# Patient Record
Sex: Male | Born: 1945 | Race: Black or African American | Hispanic: No | Marital: Married | Smoking: Never smoker
Health system: Southern US, Community
[De-identification: ages and names within clinical notes are randomized; demographics above are authoritative.]

## PROBLEM LIST (undated history)

## (undated) DIAGNOSIS — K449 Diaphragmatic hernia without obstruction or gangrene: Secondary | ICD-10-CM

## (undated) DIAGNOSIS — K5790 Diverticulosis of intestine, part unspecified, without perforation or abscess without bleeding: Secondary | ICD-10-CM

## (undated) DIAGNOSIS — N200 Calculus of kidney: Secondary | ICD-10-CM

## (undated) DIAGNOSIS — I700 Atherosclerosis of aorta: Secondary | ICD-10-CM

## (undated) DIAGNOSIS — M5136 Other intervertebral disc degeneration, lumbar region: Secondary | ICD-10-CM

## (undated) DIAGNOSIS — K635 Polyp of colon: Secondary | ICD-10-CM

## (undated) DIAGNOSIS — E119 Type 2 diabetes mellitus without complications: Secondary | ICD-10-CM

## (undated) DIAGNOSIS — M51369 Other intervertebral disc degeneration, lumbar region without mention of lumbar back pain or lower extremity pain: Secondary | ICD-10-CM

## (undated) DIAGNOSIS — D649 Anemia, unspecified: Secondary | ICD-10-CM

## (undated) DIAGNOSIS — K922 Gastrointestinal hemorrhage, unspecified: Secondary | ICD-10-CM

## (undated) DIAGNOSIS — K648 Other hemorrhoids: Secondary | ICD-10-CM

## (undated) DIAGNOSIS — K2270 Barrett's esophagus without dysplasia: Secondary | ICD-10-CM

## (undated) DIAGNOSIS — K5792 Diverticulitis of intestine, part unspecified, without perforation or abscess without bleeding: Secondary | ICD-10-CM

## (undated) DIAGNOSIS — I10 Essential (primary) hypertension: Secondary | ICD-10-CM

## (undated) HISTORY — DX: Diaphragmatic hernia without obstruction or gangrene: K44.9

## (undated) HISTORY — DX: Diverticulitis of intestine, part unspecified, without perforation or abscess without bleeding: K57.92

## (undated) HISTORY — DX: Polyp of colon: K63.5

## (undated) HISTORY — DX: Anemia, unspecified: D64.9

## (undated) HISTORY — DX: Calculus of kidney: N20.0

## (undated) HISTORY — DX: Atherosclerosis of aorta: I70.0

## (undated) HISTORY — DX: Other hemorrhoids: K64.8

## (undated) HISTORY — DX: Other intervertebral disc degeneration, lumbar region: M51.36

## (undated) HISTORY — PX: ACHILLES TENDON REPAIR: SUR1153

## (undated) HISTORY — PX: KNEE ARTHROSCOPY: SUR90

## (undated) HISTORY — DX: Barrett's esophagus without dysplasia: K22.70

## (undated) HISTORY — DX: Other intervertebral disc degeneration, lumbar region without mention of lumbar back pain or lower extremity pain: M51.369

## (undated) HISTORY — DX: Gastrointestinal hemorrhage, unspecified: K92.2

---

## 2002-07-01 ENCOUNTER — Emergency Department (HOSPITAL_COMMUNITY): Admission: EM | Admit: 2002-07-01 | Discharge: 2002-07-01 | Payer: Self-pay | Admitting: Emergency Medicine

## 2002-07-01 ENCOUNTER — Encounter: Payer: Self-pay | Admitting: Emergency Medicine

## 2018-01-12 ENCOUNTER — Telehealth (INDEPENDENT_AMBULATORY_CARE_PROVIDER_SITE_OTHER): Payer: Self-pay | Admitting: Physical Medicine and Rehabilitation

## 2018-01-12 NOTE — Telephone Encounter (Signed)
Patient came in stated he was told he could get an appointment with [facility]. Stated sciatic nerve issue.  Please call this patient and advise or schedule an appointment

## 2018-01-13 NOTE — Telephone Encounter (Signed)
Scheduled for 01/26/18 at 0900. I asked patient to bring copies of any x-rays or MRI, and he stated that he will try.

## 2018-01-26 ENCOUNTER — Ambulatory Visit (INDEPENDENT_AMBULATORY_CARE_PROVIDER_SITE_OTHER): Payer: Medicare Other | Admitting: Physical Medicine and Rehabilitation

## 2018-01-26 ENCOUNTER — Encounter (INDEPENDENT_AMBULATORY_CARE_PROVIDER_SITE_OTHER): Payer: Self-pay | Admitting: Physical Medicine and Rehabilitation

## 2018-01-26 VITALS — BP 137/85 | HR 64 | Temp 97.9°F | Ht 69.5 in | Wt 180.0 lb

## 2018-01-26 DIAGNOSIS — G8929 Other chronic pain: Secondary | ICD-10-CM

## 2018-01-26 DIAGNOSIS — M5442 Lumbago with sciatica, left side: Secondary | ICD-10-CM | POA: Diagnosis not present

## 2018-01-26 DIAGNOSIS — M47816 Spondylosis without myelopathy or radiculopathy, lumbar region: Secondary | ICD-10-CM | POA: Diagnosis not present

## 2018-01-26 DIAGNOSIS — M5416 Radiculopathy, lumbar region: Secondary | ICD-10-CM | POA: Diagnosis not present

## 2018-01-26 MED ORDER — PREDNISONE 50 MG PO TABS: ORAL_TABLET | ORAL | 0 refills | Status: DC

## 2018-01-26 MED ORDER — DICLOFENAC SODIUM 75 MG PO TBEC: 75.0000 mg | DELAYED_RELEASE_TABLET | Freq: Two times a day (BID) | ORAL | 0 refills | Status: DC

## 2018-01-26 NOTE — Progress Notes (Signed)
 .  Numeric Pain Rating Scale and Functional Assessment Average Pain 7 Pain Right Now 8 My pain is intermittent, dull and aching Pain is worse with: standing Pain improves with: rest   In the last MONTH (on 0-10 scale) has pain interfered with the following?  1. General activity like being  able to carry out your everyday physical activities such as walking, climbing stairs, carrying groceries, or moving a chair?  Rating(6)  2. Relation with others like being able to carry out your usual social activities and roles such as  activities at home, at work and in your community. Rating(3)  3. Enjoyment of life such that you have  been bothered by emotional problems such as feeling anxious, depressed or irritable?  Rating(0)

## 2018-01-27 ENCOUNTER — Encounter (INDEPENDENT_AMBULATORY_CARE_PROVIDER_SITE_OTHER): Payer: Self-pay | Admitting: Physical Medicine and Rehabilitation

## 2018-01-27 NOTE — Progress Notes (Signed)
Derrick Thornton - 72 y.o. male MRN [id number]  Date of birth: 1946-05-24  Office Visit Note: Visit Date: 01/26/2018 PCP: Darryl Lent, PA-C Referred by: No ref. provider found  Subjective: Chief Complaint  Patient presents with  . Lower Back - Pain  . Left Thigh - Pain   HPI: [name] is a young appearing 72 year old gentleman who have actually seen on several occasions because I see his mother.  This is the first time we have seen him as a patient.  He does come in as a self-referral.  He reports 4 weeks of severe at times low back and left buttock pain that radiates into the thigh and a posterior lateral direction and somewhat past the knee into the upper part of the calf.  This seems to be more of an L5 distribution.  He does not report any symptoms down into the foot.  He really denies any numbness tingling or paresthesia.  He does not endorse any specific trauma 4 weeks ago or any other issues that he thinks could be related.  He does report that it may worsen while having sex.  He does not endorse a chronic history of back pain.  He reports that he was very active playing basketball in his youth as well as later in life is really had to give that up.  He is to get a lot of knee issues.  He reported taking indomethacin in his youth basketball and this seemed to help as he got older but he only took it on occasion.  He has not really been taking any medication in the 4 weeks since this started.  He has not been to physical therapy or had any chiropractic care.  He denies any right-sided complaints.  He reports pain will began soon after standing for any length of time.  Sitting and resting does make it better.  It is worse first thing in the morning.  Gets a lot of pain going from sit to stand.  He has not noted any bowel or bladder changes or focal weakness.  No unexplained weight loss.  No history of cancer.  He has a history of hypercholesterolemia as well as non-insulin-dependent  diabetes.   Review of Systems  Constitutional: Negative for chills, fever, malaise/fatigue and weight loss.  HENT: Negative for hearing loss and sinus pain.   Eyes: Negative for blurred vision, double vision and photophobia.  Respiratory: Negative for cough and shortness of breath.   Cardiovascular: Negative for chest pain, palpitations and leg swelling.  Gastrointestinal: Negative for abdominal pain, nausea and vomiting.  Genitourinary: Negative for flank pain.  Musculoskeletal: Positive for back pain. Negative for myalgias.       Left hip and leg pain  Skin: Negative for itching and rash.  Neurological: Negative for tremors, focal weakness and weakness.  Endo/Heme/Allergies: Negative.   Psychiatric/Behavioral: Negative for depression.  All other systems reviewed and are negative.  Otherwise per HPI.  Assessment & Plan: Visit Diagnoses:  1. Lumbar radiculopathy   2. Chronic bilateral low back pain with left-sided sciatica   3. Spondylosis without myelopathy or radiculopathy, lumbar region     Plan: Findings:  4 weeks of subacute fairly abrupt onset of left buttock hip and leg pain.  X-ray showing facet arthropathy and small listhesis of L4 on L5.  This seems to be likely related to potential stenosis at L4-5 with may be small disc protrusion causing the L5 symptoms.  His symptoms seem to be more  indicative to stenosis and disc herniation.  At this point given that is been 4 weeks with no prior history and no red flag complaints I think this may get better on its own.  We discussed activity modifications.  We also discussed exercises for his back and core strengthening.  We spoke probably for 10 minutes on exercises and the fact that he does have a home gym that he can use.  We discussed the avoidance of flexion based exercises.  I want to start him on prednisone for a 5-day course.  He is to monitor his blood sugars stay hydrated.  They were going to add diclofenac twice a day just for 2  weeks.  This should not be a problem as he is on omeprazole and willing to do this temporarily.  Over the course of the 4 weeks doing that if his symptoms are much better we will see him as needed.  I will make a follow-up appointment for 4 weeks from now.  Next step would be possible epidural injection versus MRI of the lumbar spine.  Without any red flag complaints at this point in the newer onset of pain we do not really need to do the MRI at this point.  Exam was really nonfocal from a nerve standpoint.    Meds & Orders:  Meds ordered this encounter  Medications  . predniSONE (DELTASONE) 50 MG tablet    Sig: Take 1 tablet daily with food for 5 days until finished    Dispense:  5 tablet    Refill:  0  . diclofenac (VOLTAREN) 75 MG EC tablet    Sig: Take 1 tablet (75 mg total) by mouth 2 (two) times daily. Take with food, only for 2 weeks    Dispense:  60 tablet    Refill:  0   No orders of the defined types were placed in this encounter.   Follow-up: Return in about 4 weeks (around 02/23/2018).   Procedures: No procedures performed  No notes on file   Clinical History: Plain film [facility] lumbar AP [facility]  Demonstrated fairly normal alignment on the AP but with very small grade 1 listhesis anteriorly well for on L5. There is facet arthropathy left more than right at L4-5 in particular but also at L5-S1. There is bilateral foraminal narrowing. Disc heights are pretty well-maintained. There are no fractures or dislocations, sacroiliac joints are well-maintained.  You could see the upper part of the hips bilaterally and there appears to be very little degenerative change of the hips.   He reports that he has never smoked. He has never used smokeless tobacco. No results for input(s): HGBA1C, LABURIC in the last 8760 hours.  Objective:  VS:  HT:5' 9.5" (176.5 cm)   WT:180 lb (81.6 kg)  BMI:26.21    BP:137/85  HR:64bpm  TEMP:97.9 F (36.6 C)(Oral)   RESP:97 % Physical Exam  Constitutional: He is oriented to person, place, and time. He appears well-developed and well-nourished. No distress.  HENT:  Head: Normocephalic and atraumatic.  Nose: Nose normal.  Mouth/Throat: Oropharynx is clear and moist.  Eyes: Pupils are equal, round, and reactive to light. Conjunctivae are normal.  Neck: Normal range of motion. Neck supple. No tracheal deviation present.  Cardiovascular: Regular rhythm and intact distal pulses.  Pulmonary/Chest: Effort normal and breath sounds normal.  Abdominal: Soft. He exhibits no distension. There is no rebound and no guarding.  Musculoskeletal: He exhibits no edema or deformity.  Patient is somewhat slow to rise from a seated position he does have pain with extension rotation of the lumbar spine left more than right.  Pain does worsen with facet joint loading.  He has no paraspinal tenderness or trigger points.  No pain over the greater trochanter PSIS.  Deep palpation over the left PSIS is painful.  He has a negative slump test bilaterally.  He has good distal strength without any clonus.  He has no pain with hip rotation.  Neurological: He is alert and oriented to person, place, and time. He exhibits normal muscle tone. Coordination normal.  Skin: Skin is warm. No rash noted.  Psychiatric: He has a normal mood and affect. His behavior is normal.  Nursing note and vitals reviewed.   Ortho Exam Imaging: No results found.  Past Medical/Family/Surgical/Social History: Medications & Allergies reviewed per EMR, new medications updated. There are no active problems to display for this patient.  History reviewed. No pertinent past medical history. History reviewed. No pertinent family history. History reviewed. No pertinent surgical history. Social History   Occupational History  . Not on file  Tobacco Use  . Smoking status: Never Smoker  . Smokeless tobacco: Never Used  Substance and Sexual Activity  . Alcohol  use: Not Currently  . Drug use: Never  . Sexual activity: Yes

## 2018-02-09 ENCOUNTER — Ambulatory Visit (INDEPENDENT_AMBULATORY_CARE_PROVIDER_SITE_OTHER): Payer: Medicare Other | Admitting: Physical Medicine and Rehabilitation

## 2018-02-09 ENCOUNTER — Encounter (INDEPENDENT_AMBULATORY_CARE_PROVIDER_SITE_OTHER): Payer: Self-pay | Admitting: Physical Medicine and Rehabilitation

## 2018-02-09 VITALS — BP 150/99 | HR 86 | Ht 69.5 in | Wt 181.0 lb

## 2018-02-09 DIAGNOSIS — M5416 Radiculopathy, lumbar region: Secondary | ICD-10-CM | POA: Diagnosis not present

## 2018-02-09 DIAGNOSIS — M5442 Lumbago with sciatica, left side: Secondary | ICD-10-CM | POA: Diagnosis not present

## 2018-02-09 DIAGNOSIS — G8929 Other chronic pain: Secondary | ICD-10-CM

## 2018-02-09 MED ORDER — INDOMETHACIN 25 MG PO CAPS: 25.0000 mg | ORAL_CAPSULE | Freq: Two times a day (BID) | ORAL | 0 refills | Status: DC

## 2018-02-09 NOTE — Progress Notes (Signed)
 .  Numeric Pain Rating Scale and Functional Assessment Average Pain 5 Pain Right Now 4 My pain is intermittent, sharp and dull Pain is worse with: some activites Pain improves with: medication   In the last MONTH (on 0-10 scale) has pain interfered with the following?  1. General activity like being  able to carry out your everyday physical activities such as walking, climbing stairs, carrying groceries, or moving a chair?  Rating(3)  2. Relation with others like being able to carry out your usual social activities and roles such as  activities at home, at work and in your community. Rating(1)  3. Enjoyment of life such that you have  been bothered by emotional problems such as feeling anxious, depressed or irritable?  Rating(0)

## 2018-02-20 ENCOUNTER — Other Ambulatory Visit (INDEPENDENT_AMBULATORY_CARE_PROVIDER_SITE_OTHER): Payer: Self-pay | Admitting: Physical Medicine and Rehabilitation

## 2018-02-20 NOTE — Telephone Encounter (Signed)
Please advise 

## 2018-02-21 NOTE — Progress Notes (Signed)
Derrick Thornton - 72 y.o. male MRN [id number]  Date of birth: 09/20/1945  Office Visit Note: Visit Date: 02/09/2018 PCP: Darryl Lentaylor, Amanda, PA-C Referred by: Darryl Lentaylor, Amanda, PA-C  Subjective: Chief Complaint  Patient presents with  . Left Thigh - Pain   HPI: Derrick Thornton is a 72 year old gentleman that returns today a couple weeks from our first visit with him.  He still has pain in the left buttocks that radiates in the left thigh that we think is somewhat related to probably a combination of stenosis and may be disc problem.  Is really worse in the morning and at night.  He reports only trying the diclofenac for a few days and discontinuing that.  He tried the prednisone just for a couple of days and it made his sugar increased so he stopped taking that.  We had prior discussions with him at length about the use of the medications and what they probably would do in the pitfalls of doing that.  We talked about the risk the benefit and he agreed at the time but really has changed how he took the medications.  He still rates his pain as a 5 out of 10 is intermittent sharp and sometimes dull and aching.  It is limiting some of his activities.  He denies any focal weakness or new issues.  He really feels like indomethacin would help him out greatly.  Evidently when he was younger and playing basketball he had a history of gout and the indomethacin helped greatly.   Review of Systems  Constitutional: Negative for chills, fever, malaise/fatigue and weight loss.  HENT: Negative for hearing loss and sinus pain.   Eyes: Negative for blurred vision, double vision and photophobia.  Respiratory: Negative for cough and shortness of breath.   Cardiovascular: Negative for chest pain, palpitations and leg swelling.  Gastrointestinal: Negative for abdominal pain, nausea and vomiting.  Genitourinary: Negative for flank pain.  Musculoskeletal: Positive for back pain. Negative for myalgias.       Hip and buttock  pain  Skin: Negative for itching and rash.  Neurological: Negative for tremors, focal weakness and weakness.  Endo/Heme/Allergies: Negative.   Psychiatric/Behavioral: Negative for depression.  All other systems reviewed and are negative.  Otherwise per HPI.  Assessment & Plan: Visit Diagnoses:  1. Lumbar radiculopathy   2. Chronic bilateral low back pain with left-sided sciatica     Plan: Findings:  6 weeks now of abrupt onset of left buttock and thigh pain and low back pain.  Chronic history of some low back pain.  Failure really to complete course of diclofenac or prednisone but with some relief after the prednisone but he did take for at least a couple of days.  He seems intent on wanting indomethacin trial just to see if it would help his pain.  He has a history of diabetes and he is on Crestor.  He is not on antihypertensive medication that we know of and his blood pressure today was 150/99.  We discussed blood pressure with him at length.  He will have this looked at by his primary care physician.  I am going to go ahead and give him a really short course of indomethacin just to see if that will give him some relief and I am still hoping over the next couple of weeks or so if this is a radicular complaint that hopefully it will continue to improve.  Next step would be MRI of the lumbar spine with  looking at possible epidural injection or spinal intervention.  We also talked about formal physical therapy.    Meds & Orders:  Meds ordered this encounter  Medications  . indomethacin (INDOCIN) 25 MG capsule    Sig: Take 1 capsule (25 mg total) by mouth 2 (two) times daily with a meal. Limit to 5 days treatment    Dispense:  30 capsule    Refill:  0   No orders of the defined types were placed in this encounter.   Follow-up: Return if symptoms worsen or fail to improve.   Procedures: No procedures performed  No notes on file   Clinical History: Plain film x-ray lumbar AP and lateral  12/2017 at [facility]  Demonstrated fairly normal alignment on the AP but with very small grade 1 listhesis anteriorly well for on L5. There is facet arthropathy left more than right at L4-5 in particular but also at L5-S1. There is bilateral foraminal narrowing. Disc heights are pretty well-maintained. There are no fractures or dislocations, sacroiliac joints are well-maintained.  You could see the upper part of the hips bilaterally and there appears to be very little degenerative change of the hips.   He reports that he has never smoked. He has never used smokeless tobacco. No results for input(s): HGBA1C, LABURIC in the last 8760 hours.  Objective:  VS:  HT:5' 9.5" (176.5 cm)   WT:181 lb (82.1 kg)  BMI:26.35    BP:(!) 150/99  HR:86bpm  TEMP: ( )  RESP:  Physical Exam  Constitutional: He is oriented to person, place, and time. He appears well-developed and well-nourished. No distress.  HENT:  Head: Normocephalic and atraumatic.  Eyes: Pupils are equal, round, and reactive to light. Conjunctivae are normal.  Neck: Normal range of motion. Neck supple.  Cardiovascular: Regular rhythm and intact distal pulses.  Pulmonary/Chest: Effort normal. No respiratory distress.  Musculoskeletal:  Patient ambulates without any has good strength in the lower extremities without deficit.  Some pain over the PSIS on the left.  No focal trigger points.  No pain over the greater trochanters no pain with hip rotation.  Neurological: He is alert and oriented to person, place, and time. No sensory deficit. He exhibits normal muscle tone. Coordination normal.  Skin: Skin is warm and dry. No rash noted. No erythema.  Psychiatric: He has a normal mood and affect.  Nursing note and vitals reviewed.   Ortho Exam Imaging: No results found.  Past Medical/Family/Surgical/Social History: Medications & Allergies reviewed per EMR, new medications updated. There are no active problems to display for this  patient.  History reviewed. No pertinent past medical history. History reviewed. No pertinent family history. History reviewed. No pertinent surgical history. Social History   Occupational History  . Not on file  Tobacco Use  . Smoking status: Never Smoker  . Smokeless tobacco: Never Used  Substance and Sexual Activity  . Alcohol use: Not Currently  . Drug use: Never  . Sexual activity: Yes

## 2018-02-23 ENCOUNTER — Telehealth (INDEPENDENT_AMBULATORY_CARE_PROVIDER_SITE_OTHER): Payer: Self-pay | Admitting: Physical Medicine and Rehabilitation

## 2018-02-23 NOTE — Telephone Encounter (Signed)
L5-S1 interlam, if not helped after that then MRI

## 2018-02-23 NOTE — Telephone Encounter (Signed)
Called [phone] automated voice states no prior authorization for [id number]. Reference # [id number]

## 2018-02-23 NOTE — Telephone Encounter (Signed)
Patient has Medicare, Tricare, and Community education officer. ? Auth for [id number]. Patient aware we will check for authorization and then call him to schedule.

## 2018-02-23 NOTE — Telephone Encounter (Signed)
Pt is scheduled 03/07/18 with driver no BTs.

## 2018-03-07 ENCOUNTER — Encounter (INDEPENDENT_AMBULATORY_CARE_PROVIDER_SITE_OTHER): Payer: Self-pay | Admitting: Physical Medicine and Rehabilitation

## 2018-03-07 ENCOUNTER — Ambulatory Visit (INDEPENDENT_AMBULATORY_CARE_PROVIDER_SITE_OTHER): Payer: Medicare Other | Admitting: Physical Medicine and Rehabilitation

## 2018-03-07 DIAGNOSIS — M5442 Lumbago with sciatica, left side: Secondary | ICD-10-CM | POA: Diagnosis not present

## 2018-03-07 DIAGNOSIS — M47816 Spondylosis without myelopathy or radiculopathy, lumbar region: Secondary | ICD-10-CM | POA: Diagnosis not present

## 2018-03-07 DIAGNOSIS — G8929 Other chronic pain: Secondary | ICD-10-CM

## 2018-03-07 NOTE — Progress Notes (Signed)
 .  Numeric Pain Rating Scale and Functional Assessment Average Pain 4   In the last MONTH (on 0-10 scale) has pain interfered with the following?  1. General activity like being  able to carry out your everyday physical activities such as walking, climbing stairs, carrying groceries, or moving a chair?  Rating(4)   -Driver(no driver), -BT, -Dye Allergies.

## 2018-03-23 ENCOUNTER — Encounter (INDEPENDENT_AMBULATORY_CARE_PROVIDER_SITE_OTHER): Payer: Self-pay | Admitting: Physical Medicine and Rehabilitation

## 2018-03-23 NOTE — Progress Notes (Signed)
Derrick Thornton - 72 y.o. male MRN [id number]  Date of birth: [birth year]  Office Visit Note: Visit Date: 03/07/2018 PCP: Darryl Lent, PA-C Referred by: Darryl Lent, PA-C  Subjective: Chief Complaint  Patient presents with  . Lower Back - Pain  . Left Thigh - Pain   HPI: Derrick Thornton is a 72 y.o. male who comes in today For planned left interlaminar epidural steroid injection do to what we thought was increasing pain in the left low back and thigh region despite conservative care of activity modification exercise and anti-inflammatory medication.  However he comes in today stating that his symptoms of calm down quite a bit since he last saw [place].  He reports that he really only gets a lot of symptoms now if he is walking for a long time.  He tries to remain active and feels like he is a pretty active 72 year old gentleman.  His pain still radiates from the low back in the left posterior lateral thigh.  He reports that the pain is not really gone away but has calmed down quite a bit.  He has started using some CBD oil and feels like that made it better.  His average pain is 4 out of 10 at this point.  He has no focal weakness.  Review of Systems  Constitutional: Negative for weight loss.  Musculoskeletal: Positive for back pain and joint pain.  Neurological: Negative for tingling and weakness.   Otherwise per HPI.  Assessment & Plan: Visit Diagnoses:  1. Chronic bilateral low back pain with left-sided sciatica   2. Spondylosis without myelopathy or radiculopathy, lumbar region     Plan: Findings:  Significantly improved chronic low back and right hip and thigh pain.  Patient can continue to use CBD oil and intermittent anti-inflammatory.  He needs to follow-up with his primary doctor concerning anti-inflammatories.  He will continue with home exercises that were instructed by [place] as well as activity modification and we have talked about on few occasions.  We will see him back as  needed.    Meds & Orders: No orders of the defined types were placed in this encounter.  No orders of the defined types were placed in this encounter.   Follow-up: Return if symptoms worsen or fail to improve.   Procedures: No procedures performed  No notes on file   Clinical History: Plain film x-ray lumbar AP and lateral 12/2017 at [facility]  Demonstrated fairly normal alignment on the AP but with very small grade 1 listhesis anteriorly well for on L5. There is facet arthropathy left more than right at L4-5 in particular but also at L5-S1. There is bilateral foraminal narrowing. Disc heights are pretty well-maintained. There are no fractures or dislocations, sacroiliac joints are well-maintained.  You could see the upper part of the hips bilaterally and there appears to be very little degenerative change of the hips.   He reports that he has never smoked. He has never used smokeless tobacco. No results for input(s): HGBA1C, LABURIC in the last 8760 hours.  Objective:  VS:  HT:    WT:   BMI:     BP:   HR: bpm  TEMP: ( )  RESP:  Physical Exam  Constitutional: He is oriented to person, place, and time. He appears well-developed and well-nourished. No distress.  HENT:  Head: Normocephalic and atraumatic.  Eyes: Pupils are equal, round, and reactive to light. Conjunctivae are normal.  Neck: Normal range of motion.  Neck supple.  Cardiovascular: Regular rhythm and intact distal pulses.  Pulmonary/Chest: Effort normal. No respiratory distress.  Musculoskeletal:  Patient stands and ambulates without aid he has some difficulty going to full extension and is stiff the lumbar spine.  He has no pain with hip rotation good distal strength.  Neurological: He is alert and oriented to person, place, and time.  Skin: Skin is warm and dry. No rash noted. No erythema.  Psychiatric: He has a normal mood and affect.  Nursing note and vitals reviewed.   Ortho Exam Imaging: No  results found.  Past Medical/Family/Surgical/Social History: Medications & Allergies reviewed per EMR, new medications updated. There are no active problems to display for this patient.  History reviewed. No pertinent past medical history. History reviewed. No pertinent family history. History reviewed. No pertinent surgical history. Social History   Occupational History  . Not on file  Tobacco Use  . Smoking status: Never Smoker  . Smokeless tobacco: Never Used  Substance and Sexual Activity  . Alcohol use: Not Currently  . Drug use: Never  . Sexual activity: Yes

## 2018-04-05 ENCOUNTER — Other Ambulatory Visit (INDEPENDENT_AMBULATORY_CARE_PROVIDER_SITE_OTHER): Payer: Self-pay | Admitting: Physical Medicine and Rehabilitation

## 2018-04-05 NOTE — Telephone Encounter (Signed)
Please advise 

## 2018-04-06 NOTE — Telephone Encounter (Signed)
Please let him know that I will refill x1 and then he should check with PCP, I do not feel comfortable with him taking long term

## 2018-04-06 NOTE — Telephone Encounter (Signed)
Called pt and advised.  

## 2018-05-05 ENCOUNTER — Encounter (HOSPITAL_BASED_OUTPATIENT_CLINIC_OR_DEPARTMENT_OTHER): Payer: Self-pay | Admitting: *Deleted

## 2018-05-05 ENCOUNTER — Other Ambulatory Visit: Payer: Self-pay

## 2018-05-05 ENCOUNTER — Inpatient Hospital Stay (HOSPITAL_BASED_OUTPATIENT_CLINIC_OR_DEPARTMENT_OTHER)
Admission: EM | Admit: 2018-05-05 | Discharge: 2018-05-09 | DRG: 378 | Disposition: A | Discharge status: Home / Self Care | Payer: Medicare Other | Attending: Internal Medicine | Admitting: Internal Medicine

## 2018-05-05 DIAGNOSIS — E119 Type 2 diabetes mellitus without complications: Secondary | ICD-10-CM

## 2018-05-05 DIAGNOSIS — I10 Essential (primary) hypertension: Secondary | ICD-10-CM

## 2018-05-05 DIAGNOSIS — K5731 Diverticulosis of large intestine without perforation or abscess with bleeding: Secondary | ICD-10-CM | POA: Diagnosis present

## 2018-05-05 DIAGNOSIS — K5790 Diverticulosis of intestine, part unspecified, without perforation or abscess without bleeding: Secondary | ICD-10-CM

## 2018-05-05 DIAGNOSIS — Z79899 Other long term (current) drug therapy: Secondary | ICD-10-CM

## 2018-05-05 DIAGNOSIS — K648 Other hemorrhoids: Secondary | ICD-10-CM

## 2018-05-05 DIAGNOSIS — K625 Hemorrhage of anus and rectum: Secondary | ICD-10-CM | POA: Diagnosis present

## 2018-05-05 DIAGNOSIS — Z7984 Long term (current) use of oral hypoglycemic drugs: Secondary | ICD-10-CM

## 2018-05-05 DIAGNOSIS — K922 Gastrointestinal hemorrhage, unspecified: Secondary | ICD-10-CM | POA: Diagnosis not present

## 2018-05-05 DIAGNOSIS — D62 Acute posthemorrhagic anemia: Secondary | ICD-10-CM

## 2018-05-05 DIAGNOSIS — D649 Anemia, unspecified: Secondary | ICD-10-CM | POA: Diagnosis not present

## 2018-05-05 HISTORY — DX: Type 2 diabetes mellitus without complications: E11.9

## 2018-05-05 HISTORY — DX: Essential (primary) hypertension: I10

## 2018-05-05 HISTORY — DX: Diverticulosis of intestine, part unspecified, without perforation or abscess without bleeding: K57.90

## 2018-05-05 LAB — CBC
HCT: 24.4 % — ABNORMAL LOW (ref 39.0–52.0)
Hemoglobin: 7.8 g/dL — ABNORMAL LOW (ref 13.0–17.0)
MCH: 33.1 pg (ref 26.0–34.0)
MCHC: 32 g/dL (ref 30.0–36.0)
MCV: 103.4 fL — ABNORMAL HIGH (ref 80.0–100.0)
Platelets: 273 10*3/uL (ref 150–400)
RBC: 2.36 MIL/uL — ABNORMAL LOW (ref 4.22–5.81)
RDW: 13 % (ref 11.5–15.5)
WBC: 10.9 10*3/uL — ABNORMAL HIGH (ref 4.0–10.5)
nRBC: 0 % (ref 0.0–0.2)

## 2018-05-05 LAB — CBC WITH DIFFERENTIAL/PLATELET
Abs Immature Granulocytes: 0.01 10*3/uL (ref 0.00–0.07)
Basophils Absolute: 0 10*3/uL (ref 0.0–0.1)
Basophils Relative: 1 %
Eosinophils Absolute: 0.1 10*3/uL (ref 0.0–0.5)
Eosinophils Relative: 1 %
HCT: 23.3 % — ABNORMAL LOW (ref 39.0–52.0)
Hemoglobin: 7.3 g/dL — ABNORMAL LOW (ref 13.0–17.0)
Immature Granulocytes: 0 %
Lymphocytes Relative: 27 %
Lymphs Abs: 1.9 10*3/uL (ref 0.7–4.0)
MCH: 32.3 pg (ref 26.0–34.0)
MCHC: 31.3 g/dL (ref 30.0–36.0)
MCV: 103.1 fL — ABNORMAL HIGH (ref 80.0–100.0)
Monocytes Absolute: 0.7 10*3/uL (ref 0.1–1.0)
Monocytes Relative: 9 %
Neutro Abs: 4.4 10*3/uL (ref 1.7–7.7)
Neutrophils Relative %: 62 %
Platelets: 258 10*3/uL (ref 150–400)
RBC: 2.26 MIL/uL — ABNORMAL LOW (ref 4.22–5.81)
RDW: 12.2 % (ref 11.5–15.5)
WBC: 7.1 10*3/uL (ref 4.0–10.5)
nRBC: 0 % (ref 0.0–0.2)

## 2018-05-05 LAB — COMPREHENSIVE METABOLIC PANEL
ALT: 16 U/L (ref 0–44)
ALT: 16 U/L (ref 0–44)
AST: 27 U/L (ref 15–41)
AST: 21 U/L (ref 15–41)
Albumin: 3.6 g/dL (ref 3.5–5.0)
Albumin: 3.4 g/dL — ABNORMAL LOW (ref 3.5–5.0)
Alkaline Phosphatase: 37 U/L — ABNORMAL LOW (ref 38–126)
Alkaline Phosphatase: 38 U/L (ref 38–126)
Anion gap: 8 (ref 5–15)
Anion gap: 9 (ref 5–15)
BUN: 24 mg/dL — ABNORMAL HIGH (ref 8–23)
BUN: 24 mg/dL — ABNORMAL HIGH (ref 8–23)
CO2: 24 mmol/L (ref 22–32)
CO2: 24 mmol/L (ref 22–32)
Calcium: 8.4 mg/dL — ABNORMAL LOW (ref 8.9–10.3)
Calcium: 8.3 mg/dL — ABNORMAL LOW (ref 8.9–10.3)
Chloride: 104 mmol/L (ref 98–111)
Chloride: 106 mmol/L (ref 98–111)
Creatinine, Ser: 1.16 mg/dL (ref 0.61–1.24)
Creatinine, Ser: 1.12 mg/dL (ref 0.61–1.24)
GFR calc Af Amer: >60 mL/min (ref >60)
GFR calc Af Amer: >60 mL/min (ref >60)
GFR calc non Af Amer: >60 mL/min (ref >60)
GFR calc non Af Amer: >60 mL/min (ref >60)
Glucose, Bld: 152 mg/dL — ABNORMAL HIGH (ref 70–99)
Glucose, Bld: 140 mg/dL — ABNORMAL HIGH (ref 70–99)
Potassium: 3.6 mmol/L (ref 3.5–5.1)
Potassium: 3.8 mmol/L (ref 3.5–5.1)
Sodium: 136 mmol/L (ref 135–145)
Sodium: 139 mmol/L (ref 135–145)
Total Bilirubin: 0.7 mg/dL (ref 0.3–1.2)
Total Bilirubin: 0.9 mg/dL (ref 0.3–1.2)
Total Protein: 6.3 g/dL — ABNORMAL LOW (ref 6.5–8.1)
Total Protein: 5.9 g/dL — ABNORMAL LOW (ref 6.5–8.1)

## 2018-05-05 LAB — PROTIME-INR
INR: 1.04
Prothrombin Time: 13.5 seconds (ref 11.4–15.2)

## 2018-05-05 LAB — MRSA PCR SCREENING: MRSA by PCR: NEGATIVE

## 2018-05-05 LAB — OCCULT BLOOD X 1 CARD TO LAB, STOOL: Fecal Occult Bld: POSITIVE — AB

## 2018-05-05 MED ORDER — SODIUM CHLORIDE 0.9 % IV SOLN
INTRAVENOUS | Status: DC
Start: ? — End: 2018-05-05

## 2018-05-05 MED ORDER — SODIUM CHLORIDE 0.9% IV SOLUTION
Freq: Once | INTRAVENOUS | Status: AC
  Administered 2018-05-05: 19:00:00 via INTRAVENOUS
  Filled 2018-05-05: qty 250

## 2018-05-05 MED ORDER — SODIUM CHLORIDE 0.9 % IV BOLUS
1000.0000 mL | Freq: Once | INTRAVENOUS | Status: AC
  Administered 2018-05-05: 1000 mL via INTRAVENOUS

## 2018-05-05 MED ORDER — SODIUM CHLORIDE FLUSH 0.9 % IV SOLN
5.00 | INTRAVENOUS | Status: DC
Start: ? — End: 2018-05-05

## 2018-05-05 MED ORDER — SODIUM CHLORIDE 0.9% IV SOLUTION: Freq: Once | INTRAVENOUS | Status: DC

## 2018-05-05 MED ORDER — ONDANSETRON HCL 4 MG/2ML IJ SOLN
4.00 | INTRAMUSCULAR | Status: DC
Start: ? — End: 2018-05-05

## 2018-05-05 MED ORDER — ACETAMINOPHEN 325 MG PO TABS
650.00 | ORAL_TABLET | ORAL | Status: DC
Start: ? — End: 2018-05-05

## 2018-05-05 MED ORDER — SODIUM CHLORIDE FLUSH 0.9 % IV SOLN
5.00 | INTRAVENOUS | Status: DC
Start: 2018-05-04 — End: 2018-05-05

## 2018-05-05 MED ORDER — SODIUM CHLORIDE 0.9 % IV SOLN
INTRAVENOUS | Status: DC
  Administered 2018-05-05 – 2018-05-09 (×7): via INTRAVENOUS

## 2018-05-05 MED ORDER — PIPERACILLIN-TAZOBACTAM 3.375 G IVPB
3.3750 g | Freq: Three times a day (TID) | INTRAVENOUS | Status: DC
  Administered 2018-05-05 – 2018-05-06 (×2): 3.375 g via INTRAVENOUS
  Filled 2018-05-05 (×2): qty 50

## 2018-05-05 MED ORDER — MORPHINE SULFATE 4 MG/ML IJ SOLN
4.00 | INTRAMUSCULAR | Status: DC
Start: ? — End: 2018-05-05

## 2018-05-05 NOTE — ED Notes (Signed)
Pt on auto VS  

## 2018-05-05 NOTE — ED Provider Notes (Signed)
[facility] EMERGENCY DEPARTMENT Provider Note   CSN: [id number] Arrival date & time: 05/05/18  1557     History   Chief Complaint Chief Complaint  Patient presents with  . Abdominal Pain    HPI DEIDRICK RAINEY is a 72 y.o. male.  HPI   MAXIMOS ZAYAS is a 72 y.o. male, with a history of DM, diverticulosis, HTN, presenting to the ED with bright red blood per rectum for last 2 days.  Patient had onset of left lower quadrant abdominal pain, dull and aching, 3/10, nonradiating.  This pain was followed shortly thereafter with the need to have a bowel movement.  Patient states he had a large amount of blood in the toilet mixed with dark stool.  He has had at least 3 of these large bloody bowel movements.  Began to have some shortness of breath yesterday.  Denies anticoagulation.  States he has had this issue twice before.  He states he thinks the bleeding is a diverticular bleed.  Was seen 2 days ago at [facility] in the ED.  States he was told that he was to be admitted, but was not moved from the ED.  He became frustrated and decided to leave.  Last colonoscopy about 1 year ago. Has seen Dr. Noe Gens with [facility] in the past.   Denies syncope, chest pain, fever, nausea/vomiting, or any other complaints.    Past Medical History:  Diagnosis Date  . Diabetes mellitus without complication (HCC)   . Diverticulosis   . Hypertension     Patient Active Problem List   Diagnosis Date Noted  . GI bleed 05/05/2018    Past Surgical History:  Procedure Laterality Date  . ACHILLES TENDON REPAIR    . KNEE ARTHROSCOPY          Home Medications    Prior to Admission medications   Medication Sig Start Date End Date Taking? Authorizing Provider  diclofenac (VOLTAREN) 75 MG EC tablet Take 1 tablet (75 mg total) by mouth 2 (two) times daily. Take with food, only for 2 weeks 01/26/18   Tyrell Antonio, MD  ferrous sulfate 325 (65 FE) MG tablet TK 1 T  PO BID 10/04/17   [provider]  GLIMEPIRIDE PO Take by mouth.    [provider]  indomethacin (INDOCIN) 25 MG capsule TAKE ONE CAPSULE BY MOUTH TWICE DAILY WITH A MEAL( LIMIT TO 5 DAYS TREATMENT) 04/06/18   Tyrell Antonio, MD  METFORMIN HCL PO Take by mouth.    [provider]  OMEPRAZOLE PO Take by mouth.    [provider]  predniSONE (DELTASONE) 50 MG tablet Take 1 tablet daily with food for 5 days until finished 01/26/18   Tyrell Antonio, MD  rosuvastatin (CRESTOR) 10 MG tablet Take 10 mg by mouth daily.    [provider]  tadalafil (CIALIS) 5 MG tablet Take by mouth. 08/20/15   [provider]    Family History History reviewed. No pertinent family history.  Social History Social History   Tobacco Use  . Smoking status: Never Smoker  . Smokeless tobacco: Never Used  Substance Use Topics  . Alcohol use: Not Currently  . Drug use: Never     Allergies   Patient has no known allergies.   Review of Systems Review of Systems  Constitutional: Positive for chills and fatigue. Negative for fever.  Respiratory: Positive for shortness of breath. Negative for cough.   Cardiovascular: Negative for  chest pain.  Gastrointestinal: Positive for abdominal pain, blood in stool and diarrhea. Negative for nausea and vomiting.  Musculoskeletal: Negative for back pain.  Neurological: Negative for dizziness, syncope and light-headedness.  All other systems reviewed and are negative.    Physical Exam Updated Vital Signs BP 122/80 (BP Location: Left Arm)   Pulse (!) 132   Temp 98.3 F (36.8 C) (Oral)   Resp 18   Ht 5\' 9"  (1.753 m)   Wt 81.6 kg   SpO2 100%   BMI 26.58 kg/m   Physical Exam  Constitutional: He appears well-developed and well-nourished. No distress.  HENT:  Head: Normocephalic and atraumatic.  Mouth/Throat: Mucous membranes are pale.  Eyes: Conjunctivae are normal.  Neck: Neck supple.  Cardiovascular:  Regular rhythm, normal heart sounds and intact distal pulses. Tachycardia present.  Pulmonary/Chest: Effort normal and breath sounds normal. No respiratory distress.  Abdominal: Soft. There is no tenderness. There is no guarding.  Musculoskeletal: He exhibits no edema.  Lymphadenopathy:    He has no cervical adenopathy.  Neurological: He is alert.  Skin: Skin is warm and dry. He is not diaphoretic.  Psychiatric: He has a normal mood and affect. His behavior is normal.  Nursing note and vitals reviewed.    ED Treatments / Results  Labs (all labs ordered are listed, but only abnormal results are displayed) Labs Reviewed  COMPREHENSIVE METABOLIC PANEL - Abnormal; Notable for the following components:      Result Value   Glucose, Bld 152 (*)    BUN 24 (*)    Calcium 8.4 (*)    Total Protein 6.3 (*)    Alkaline Phosphatase 37 (*)    All other components within normal limits  CBC WITH DIFFERENTIAL/PLATELET - Abnormal; Notable for the following components:   RBC 2.26 (*)    Hemoglobin 7.3 (*)    HCT 23.3 (*)    MCV 103.1 (*)    All other components within normal limits  OCCULT BLOOD X 1 CARD TO LAB, STOOL - Abnormal; Notable for the following components:   Fecal Occult Bld POSITIVE (*)    All other components within normal limits  PROTIME-INR  TYPE AND SCREEN  PREPARE RBC (CROSSMATCH)    EKG EKG Interpretation  Date/Time:  Friday May 05 2018 16:52:07 EST Ventricular Rate:  108 PR Interval:    QRS Duration: 96 QT Interval:  323 QTC Calculation: 433 R Axis:   16 Text Interpretation:  Sinus tachycardia Nonspecific T abnormalities, lateral leads No acute changes No significant change since last tracing Confirmed by Derwood Kaplan [phone]) on 05/05/2018 5:42:41 PM   Radiology No results found.   CLINICAL DATA: Abdominal pain and rectal bleeding. History of nephroureteral stent and kidney stones.  EXAM: CTA ABDOMEN AND PELVIS WITHOUT AND WITH  CONTRAST  TECHNIQUE: Multidetector CT imaging of the abdomen and pelvis was performed using the standard protocol during bolus administration of intravenous contrast. Multiplanar reconstructed images and MIPs were obtained and reviewed to evaluate the vascular anatomy.  CONTRAST: 100 cc Omnipaque 350  COMPARISON: CT abdomen and pelvis February 01, 2010  FINDINGS: VASCULAR  Aorta: Abdominal aorta is normal course and caliber. Homogeneous contrast opacification of aortoiliac vessels without dissection, aneurysm, luminal irregularity, periaortic fluid collections, or contrast extravasation. Mild intimal thickening calcific atherosclerosis.  Celiac: Patent.  SMA: Patent.  Renals: Patent.  IMA: Patent.  Inflow: Negative.  Veins: Negative, not tailored for evaluation. Retroaortic LEFT renal vein.  Review of the MIP images confirms the above  findings.  NON-VASCULAR  LUNG BASES: Lung bases are clear. Included heart size is normal. No pericardial effusion.  HEPATOBILIARY: Liver and gallbladder are normal.  PANCREAS: Normal.  SPLEEN: Normal.  ADRENALS/URINARY TRACT: Kidneys are orthotopic, demonstrating symmetric enhancement. 10 mm LEFT lower pole nephrolithiasis. No hydronephrosis or solid renal masses. Two LEFT renal cysts, homogeneously hypodense measuring to 17 mm. The unopacified ureters are normal in course and caliber. Urinary bladder is partially distended and unremarkable. Normal adrenal glands.  STOMACH/BOWEL: The stomach, small and large bowel are normal in course and caliber without inflammatory changes, sensitivity decreased without focal oral contrast. Mild stool distended rectum. Moderate amount of retained large bowel stool. Severe colonic diverticulosis. Eccentric mild descending colonic wall thickening (coronal image 31). Small amount of small bowel feces seen with chronic stasis. Normal appendix.  VASCULAR/LYMPHATIC: No lymphadenopathy by CT  size criteria.  REPRODUCTIVE: Punctate calcification in dorsum of glans penis. Mild prostatomegaly.  OTHER: No intraperitoneal free fluid or free air. Small fat containing umbilical hernia.  MUSCULOSKELETAL: Nonacute. Bridging sacroiliac osteophytes. Grade 1 L4-5 anterolisthesis without spondylolysis. Severe mid to lower lumbar facet arthropathy.  Review of the MIP images confirms the above findings.  IMPRESSION: VASCULAR  1. No acute vascular process. 2. Aortic Atherosclerosis (ICD10-I70.0).  NON-VASCULAR  1. Severe colonic diverticulosis. Short segment of descending colonic wall thickening without inflammatory changes, mass not excluded. Recommend colonoscopy. 2. Moderate volume retained large bowel stool. No bowel obstruction. 3. 10 mm nonobstructing LEFT nephrolithiasis.   Electronically Signed  By: Awilda Metroourtnay Bloomer M.D.  On: 05/03/2018 22:45  Other Result Information  Interface, Rad Results In - 05/03/2018 10:47 PM EST CLINICAL DATA:  Abdominal pain and rectal bleeding. History of nephroureteral stent and kidney stones.  EXAM: CTA ABDOMEN AND PELVIS WITHOUT AND WITH CONTRAST  TECHNIQUE: Multidetector CT imaging of the abdomen and pelvis was performed using the standard protocol during bolus administration of intravenous contrast. Multiplanar reconstructed images and MIPs were obtained and reviewed to evaluate the vascular anatomy.  CONTRAST:  100 cc Omnipaque 350  COMPARISON:  CT abdomen and pelvis February 01, 2010  FINDINGS: VASCULAR  Aorta: Abdominal aorta is normal course and caliber. Homogeneous contrast opacification of aortoiliac vessels without dissection, aneurysm, luminal irregularity, periaortic fluid collections, or contrast extravasation. Mild intimal thickening calcific atherosclerosis.  Celiac: Patent.  SMA: Patent.  Renals: Patent.  IMA: Patent.  Inflow: Negative.  Veins: Negative, not tailored for evaluation. Retroaortic  LEFT renal vein.  Review of the MIP images confirms the above findings.  NON-VASCULAR  LUNG BASES: Lung bases are clear. Included heart size is normal. No pericardial effusion.  HEPATOBILIARY: Liver and gallbladder are normal.  PANCREAS: Normal.  SPLEEN: Normal.  ADRENALS/URINARY TRACT: Kidneys are orthotopic, demonstrating symmetric enhancement. 10 mm LEFT lower pole nephrolithiasis. No hydronephrosis or solid renal masses. Two LEFT renal cysts, homogeneously hypodense measuring to 17 mm. The unopacified ureters are normal in course and caliber. Urinary bladder is partially distended and unremarkable. Normal adrenal glands.  STOMACH/BOWEL: The stomach, small and large bowel are normal in course and caliber without inflammatory changes, sensitivity decreased without focal oral contrast. Mild stool distended rectum. Moderate amount of retained large bowel stool. Severe colonic diverticulosis. Eccentric mild descending colonic wall thickening (coronal image 31). Small amount of small bowel feces seen with chronic stasis. Normal appendix.  VASCULAR/LYMPHATIC: No lymphadenopathy by CT size criteria.  REPRODUCTIVE: Punctate calcification in dorsum of glans penis. Mild prostatomegaly.  OTHER: No intraperitoneal free fluid or free air. Small fat containing umbilical hernia.  MUSCULOSKELETAL: Nonacute. Bridging sacroiliac osteophytes. Grade 1 L4-5 anterolisthesis without spondylolysis. Severe mid to lower lumbar facet arthropathy.  Review of the MIP images confirms the above findings.  IMPRESSION: VASCULAR  1. No acute vascular process. 2.  Aortic Atherosclerosis (ICD10-I70.0).  NON-VASCULAR  1. Severe colonic diverticulosis. Short segment of descending colonic wall thickening without inflammatory changes, mass not excluded. Recommend colonoscopy. 2. Moderate volume retained large bowel stool. No bowel obstruction. 3. 10 mm nonobstructing LEFT  nephrolithiasis.   Electronically Signed   By: Awilda Metro M.D.   On: 05/03/2018 22:45     Procedures .Critical Care Performed by: Anselm Pancoast, PA-C Authorized by: Anselm Pancoast, PA-C   Critical care provider statement:    Critical care time (minutes):  35   Critical care time was exclusive of:  Separately billable procedures and treating other patients   Critical care was necessary to treat or prevent imminent or life-threatening deterioration of the following conditions: Symptomatic anemia from GI bleed.   Critical care was time spent personally by me on the following activities:  Development of treatment plan with patient or surrogate, discussions with consultants, evaluation of patient's response to treatment, examination of patient, obtaining history from patient or surrogate, pulse oximetry, ordering and review of laboratory studies, ordering and performing treatments and interventions, re-evaluation of patient's condition and review of old charts   I assumed direction of critical care for this patient from another provider in my specialty: no     (including critical care time)  Medications Ordered in ED Medications  sodium chloride 0.9 % bolus 1,000 mL (0 mLs Intravenous Stopped 05/05/18 1752)  0.9 %  sodium chloride infusion (Manually program via Guardrails IV Fluids) ( Intravenous Stopped 05/05/18 2013)     Initial Impression / Assessment and Plan / ED Course  I have reviewed the triage vital signs and the nursing notes.  Pertinent labs & imaging results that were available during my care of the patient were reviewed by me and considered in my medical decision making (see chart for details).  Clinical Course as of May 06 2111  Fri May 05, 2018  1802 Spoke with Dr. Sunnie Nielsen, hospitalist. Agrees to admit the patient to Step down at [facility].  Requests we start blood transfusion transfer. I explained to her that we only have two unit on site that we use only in  cases of true emergencies. She states she does not feel comfortable accepting the patient for transfer if the patient does not have blood transfusing. In consultation with ED attending Dr. Rhunette Croft, we decided we could use 1 of our 2 on site units in order to allow facilitation of transfer, because ultimately transferring the patient is the goal that will benefit the patient the best.   [SJ]  1915 Spoke with Dr. Randa Evens, [facility]. States they will consult on the patient during his admission.    [SJ]  1918 Admission and bed request orders had not yet been placed.  I placed temporary admission orders with a bed request based on my previous conversation with Dr. Sunnie Nielsen.   [SJ]    Clinical Course User Index [SJ] Joy, Shawn C, PA-C    Patient presents with bright red blood per rectum.  Hemoglobin was 11.9 on 12/4 and 9.0 the morning of 12/5, 7.3 here in the ED today.  Complains of some shortness of breath, but shows no increased work of breathing and maintains excellent SPO2.  Initially tachycardic, improved with IV fluids.  One  unit of blood transfused here in our ED.  Subsequent units to be transfused at receiving facility.  Findings and plan of care discussed with Derwood Kaplan, MD. Dr. Rhunette Croft personally evaluated and examined this patient.   Vitals:   05/05/18 1945 05/05/18 1959 05/05/18 2000 05/05/18 2030  BP: 122/76 125/81 132/78 (!) 125/92  Pulse: 83 88 90 94  Resp: 16 16 14 15   Temp:  99 F (37.2 C)    TempSrc:  Oral    SpO2: 98% 98% 98% 98%  Weight:      Height:         Final Clinical Impressions(s) / ED Diagnoses   Final diagnoses:  Acute GI bleeding  Symptomatic anemia    ED Discharge Orders    None       Concepcion Living 05/05/18 2117    Derwood Kaplan, MD 05/06/18 [phone]

## 2018-05-05 NOTE — ED Triage Notes (Signed)
Pt c/o abd pain x 1 week , has been in [facility] ED for 2 days waiting for admit bed w/o success . DX gastric bleed

## 2018-05-05 NOTE — Progress Notes (Signed)
Patient presented with abdominal pain, left lower quadrant accompanied by bright red blood per rectum.  He was tachycardic initially in the ED, red blood on rectal exam.  He is hemoglobin has decreased to 7 from 9 and 11 to 3 days ago.  He was evaluated for the same to 3 days ago at a different ED.  Patient was accepted to the stepdown unit. I discussed with Shawn, patient will likely benefit from blood transfusion.  Plan to*blood transfusion in the emergency department.  He will receive O- blood. Patient will need type and screen when he arrived to [facility].  He will probably need further  the blood transfusion. Shawn, PA is a still awaiting callback from GI.  He will update in the chart with which gastroenterologist he spoke.   Hartley BarefootBelkys Gen Clagg, MD.

## 2018-05-05 NOTE — H&P (Addendum)
History and Physical    Derrick Thornton:[id number] DOB: [birth year] DOA: 05/05/2018  PCP: Darryl Lent, PA-C  Patient coming from: home via [facility]   Chief Complaint: brbpr  HPI: Derrick Thornton is a 72 y.o. male with medical history significant for htn, dm, diverticulosis, bleeding internal hemorrhoids, presents for above.  Symptoms began about 3 days ago. Had about 3 episodes of large volume brbpr. Since then stools have transitioned to maroon color, not frequent. Has had intermittent llq abdominal pain, mild to moderate in nature, not associated w/ eating. No mucous in stool, no diarrhea, no fevers, no nausea/vomiting. Presented to [facility] 2 days ago, ct scan performed, patient left due to delay in admission. Presented again today to [facility], says did feel a bit light headed and dizzy; that has since resolved.   ED Course: 1 u prbcs, 1 L, labs  Review of Systems: As per HPI otherwise 10 point review of systems negative.    Past Medical History:  Diagnosis Date  . Diabetes mellitus without complication (HCC)   . Diverticulosis   . Hypertension     Past Surgical History:  Procedure Laterality Date  . ACHILLES TENDON REPAIR    . KNEE ARTHROSCOPY       reports that he has never smoked. He has never used smokeless tobacco. He reports that he drank alcohol. He reports that he does not use drugs.  No Known Allergies  History reviewed. No pertinent family history.  Prior to Admission medications   Medication Sig Start Date End Date Taking? Authorizing Provider  diclofenac (VOLTAREN) 75 MG EC tablet Take 1 tablet (75 mg total) by mouth 2 (two) times daily. Take with food, only for 2 weeks 01/26/18   Tyrell Antonio, MD  ferrous sulfate 325 (65 FE) MG tablet TK 1 T PO BID 10/04/17   [provider]  GLIMEPIRIDE PO Take by mouth.    [provider]  indomethacin (INDOCIN) 25 MG capsule TAKE ONE CAPSULE BY MOUTH TWICE DAILY WITH A MEAL( LIMIT TO 5 DAYS  TREATMENT) 04/06/18   Tyrell Antonio, MD  METFORMIN HCL PO Take by mouth.    [provider]  OMEPRAZOLE PO Take by mouth.    [provider]  predniSONE (DELTASONE) 50 MG tablet Take 1 tablet daily with food for 5 days until finished 01/26/18   Tyrell Antonio, MD  rosuvastatin (CRESTOR) 10 MG tablet Take 10 mg by mouth daily.    [provider]  tadalafil (CIALIS) 5 MG tablet Take by mouth. 08/20/15   [provider]    Physical Exam: Vitals:   05/05/18 1959 05/05/18 2000 05/05/18 2030 05/05/18 2034  BP: 125/81 132/78 (!) 125/92 134/80  Pulse: 88 90 94 91  Resp: 16 14 15 16   Temp: 99 F (37.2 C)   98.7 F (37.1 C)  TempSrc: Oral   Oral  SpO2: 98% 98% 98% 97%  Weight:    69.1 kg  Height:    5\' 9"  (1.753 m)    Constitutional: No acute distress Head: Atraumatic Eyes: Conjunctiva clear ENM: Moist mucous membranes. Normal dentition.  Neck: Supple Respiratory: Clear to auscultation bilaterally, no wheezing/rales/rhonchi. Normal respiratory effort. No accessory muscle use. . Cardiovascular: Regular rate and rhythm. No murmurs/rubs/gallops. Abdomen: Non-tender, non-distended. No masses. No rebound or guarding. Positive bowel sounds. Musculoskeletal: No joint deformity upper and lower extremities. Normal ROM, no contractures. Normal muscle tone.  Skin: No rashes, lesions, or ulcers.  Extremities: No peripheral edema. Palpable  peripheral pulses. Neurologic: Alert, moving all 4 extremities. Psychiatric: Normal insight and judgement.   Labs on Admission: I have personally reviewed following labs and imaging studies  CBC: Recent Labs  Lab 05/05/18 1647  WBC 7.1  NEUTROABS 4.4  HGB 7.3*  HCT 23.3*  MCV 103.1*  PLT 258   Basic Metabolic Panel: Recent Labs  Lab 05/05/18 1647  NA 136  K 3.6  CL 104  CO2 24  GLUCOSE 152*  BUN 24*  CREATININE 1.16  CALCIUM 8.4*   GFR: Estimated Creatinine Clearance: 57.9 mL/min (by C-G formula based  on SCr of 1.16 mg/dL). Liver Function Tests: Recent Labs  Lab 05/05/18 1647  AST 27  ALT 16  ALKPHOS 37*  BILITOT 0.7  PROT 6.3*  ALBUMIN 3.6   No results for input(s): LIPASE, AMYLASE in the last 168 hours. No results for input(s): AMMONIA in the last 168 hours. Coagulation Profile: Recent Labs  Lab 05/05/18 1647  INR 1.04   Cardiac Enzymes: No results for input(s): CKTOTAL, CKMB, CKMBINDEX, TROPONINI in the last 168 hours. BNP (last 3 results) No results for input(s): PROBNP in the last 8760 hours. HbA1C: No results for input(s): HGBA1C in the last 72 hours. CBG: No results for input(s): GLUCAP in the last 168 hours. Lipid Profile: No results for input(s): CHOL, HDL, LDLCALC, TRIG, CHOLHDL, LDLDIRECT in the last 72 hours. Thyroid Function Tests: No results for input(s): TSH, T4TOTAL, FREET4, T3FREE, THYROIDAB in the last 72 hours. Anemia Panel: No results for input(s): VITAMINB12, FOLATE, FERRITIN, TIBC, IRON, RETICCTPCT in the last 72 hours. Urine analysis: No results found for: COLORURINE, APPEARANCEUR, LABSPEC, PHURINE, GLUCOSEU, HGBUR, BILIRUBINUR, KETONESUR, PROTEINUR, UROBILINOGEN, NITRITE, LEUKOCYTESUR  Radiological Exams on Admission: No results found.   Assessment/Plan Active Problems:   Lower GI bleed   Acute blood loss anemia   HTN (hypertension)   Type 2 diabetes mellitus (HCC)   Internal hemorrhoids   Diverticulosis   # Lower GI bleed - h drop to 7.3 from 11.9. Not currently symptomatic, normal vital signs with hemodynamic stability. Bleeding has already started to subside. Hx of bleeding internal hemorrhoids and hx significant diverticulosis - one or the other most likely source of bleeeding. However, pt does complain of some left sided abd pain and there is colon wall thickening seen on cta of abdomen performed 2 days ago, rising some concern for ischemic colitis (risk factors include age, dm, and htn). Low concern for upper GI source. - repeat  cbc - hold 2 units - transfuse if H < 8 or if becomes symptomatic - 2 PIVs in place - NPO after midnight - NS @ 125/hr - zosyn for empiric coverage given concern for possible ischemic colitis, will hopefully be able to wean shortly - [facility] consulted by outside ER and have accepted pt to consult, notify in AM - tele overnight  Addendum 11:19 PM - h 7.8, will transfuse 1 unit  # DM - diet controlled, a1c 5.0 2 days ago - hold home metformin and glimeperide - daily fasting glucose, consider starting SSI if elevated  # HTN - controlled off meds at home - hold home statin  DVT prophylaxis: SCDs Code Status: full  Family Communication: wife patricia [phone]  Disposition Plan: tbd  Consults called: [facility]  Admission status: step-down    Derrick BilisNoah B Wouk MD [facility] Pager [phone]  If 7PM-7AM, please contact night-coverage [email or web address] Password [email or web address]  05/05/2018, 10:12 PM

## 2018-05-06 DIAGNOSIS — D649 Anemia, unspecified: Secondary | ICD-10-CM

## 2018-05-06 DIAGNOSIS — I10 Essential (primary) hypertension: Secondary | ICD-10-CM

## 2018-05-06 DIAGNOSIS — E119 Type 2 diabetes mellitus without complications: Secondary | ICD-10-CM

## 2018-05-06 DIAGNOSIS — K922 Gastrointestinal hemorrhage, unspecified: Secondary | ICD-10-CM

## 2018-05-06 DIAGNOSIS — K5790 Diverticulosis of intestine, part unspecified, without perforation or abscess without bleeding: Secondary | ICD-10-CM

## 2018-05-06 DIAGNOSIS — D62 Acute posthemorrhagic anemia: Secondary | ICD-10-CM

## 2018-05-06 DIAGNOSIS — K648 Other hemorrhoids: Secondary | ICD-10-CM

## 2018-05-06 LAB — TYPE AND SCREEN: Unit division: 0

## 2018-05-06 LAB — GLUCOSE, CAPILLARY
Glucose-Capillary: 125 mg/dL — ABNORMAL HIGH (ref 70–99)
Glucose-Capillary: 120 mg/dL — ABNORMAL HIGH (ref 70–99)
Glucose-Capillary: 128 mg/dL — ABNORMAL HIGH (ref 70–99)
Glucose-Capillary: 101 mg/dL — ABNORMAL HIGH (ref 70–99)
Glucose-Capillary: 92 mg/dL (ref 70–99)

## 2018-05-06 LAB — GLUCOSE, RANDOM: Glucose, Bld: 119 mg/dL — ABNORMAL HIGH (ref 70–99)

## 2018-05-06 LAB — BASIC METABOLIC PANEL
Anion gap: 5 (ref 5–15)
BUN: 18 mg/dL (ref 8–23)
CO2: 21 mmol/L — ABNORMAL LOW (ref 22–32)
Calcium: 7.5 mg/dL — ABNORMAL LOW (ref 8.9–10.3)
Chloride: 114 mmol/L — ABNORMAL HIGH (ref 98–111)
Creatinine, Ser: 1.01 mg/dL (ref 0.61–1.24)
GFR calc Af Amer: >60 mL/min (ref >60)
GFR calc non Af Amer: >60 mL/min (ref >60)
Glucose, Bld: 105 mg/dL — ABNORMAL HIGH (ref 70–99)
Potassium: 3.8 mmol/L (ref 3.5–5.1)
Sodium: 140 mmol/L (ref 135–145)

## 2018-05-06 LAB — PREPARE RBC (CROSSMATCH)

## 2018-05-06 LAB — ABO/RH: ABO/RH(D): A POS

## 2018-05-06 LAB — APTT: aPTT: 29 seconds (ref 24–36)

## 2018-05-06 LAB — HEMOGLOBIN AND HEMATOCRIT, BLOOD
HCT: 24.1 % — ABNORMAL LOW (ref 39.0–52.0)
HCT: 25.9 % — ABNORMAL LOW (ref 39.0–52.0)
Hemoglobin: 7.6 g/dL — ABNORMAL LOW (ref 13.0–17.0)
Hemoglobin: 8.3 g/dL — ABNORMAL LOW (ref 13.0–17.0)

## 2018-05-06 LAB — BPAM RBC
Blood Product Expiration Date: 201912162359
ISSUE DATE / TIME: 201912061818
Unit Type and Rh: 9500

## 2018-05-06 LAB — PROTIME-INR
INR: 1.07
Prothrombin Time: 13.8 seconds (ref 11.4–15.2)

## 2018-05-06 MED ORDER — POLYETHYLENE GLYCOL 3350 17 G PO PACK
17.0000 g | PACK | Freq: Two times a day (BID) | ORAL | Status: DC
  Administered 2018-05-07 – 2018-05-09 (×2): 17 g via ORAL
  Filled 2018-05-06 (×2): qty 1

## 2018-05-06 MED ORDER — SODIUM CHLORIDE 0.9% IV SOLUTION
Freq: Once | INTRAVENOUS | Status: AC
  Administered 2018-05-06: 16:00:00 via INTRAVENOUS

## 2018-05-06 MED ORDER — SODIUM CHLORIDE 0.9 % IV BOLUS
2000.0000 mL | Freq: Once | INTRAVENOUS | Status: AC
  Administered 2018-05-06: 2000 mL via INTRAVENOUS

## 2018-05-06 NOTE — Progress Notes (Addendum)
[facility] PROGRESS NOTE    Progress Note  Derrick Thornton  ZOX:[id number] DOB: 10/12/1945 DOA: 05/05/2018 PCP: Darryl Lentaylor, Amanda, PA-C     Brief Narrative:   Derrick Thornton is an 72 y.o. male past medical history of hypertension diverticulosis internal bleeding hemorrhoids who presents with bright red blood per rectum that began about 3 days prior to admission when she was transfused 1 unit of packed red blood cells in the ED  Assessment/Plan:   Lower GI bleed [place] due to Diverticulosis/Acute blood loss anemia Previous hemoglobins range around 11.9 on admission 7.3, she was transfused 2 unit of packed red blood cells in the ED, and this morning is 7.8. She was started empirically on antibiotics. Keep the patient n.p.o., continue IV fluid hydration. Awaiting GI recommendations. There is no CT scan, he denies any abdominal pain, his abdominal exam is benign. We will discontinue IV antobiotics.  More concerned about a diverticular bleed, he relates he has had previous episodes of this in the past. Will keep n.p.o. until GI evaluates and GI to dictate diet. Continue to trend hemoglobin.  Diabetes mellitus type 2: With an A1c of 5. Continue to hold oral hypoglycemic agents. Keep n.p.o. continue sliding scale with CBGs every 4 hours.  Essential HTN (hypertension): Hold home dose of statins and antihypertensive medication      DVT prophylaxis: SCd's Family Communication:none Disposition Plan/Barrier to D/C: home once gi bleed subsides Code Status:     Code Status Orders  (From admission, onward)         Start     Ordered   05/05/18 2223  Full code  Continuous     05/05/18 2222        Code Status History    This patient has a current code status but no historical code status.        IV Access:    Peripheral IV   Procedures and diagnostic studies:   No results found.   Medical Consultants:    None.  Anti-Infectives:   Zosyn stopped on  12.7.2019  Subjective:    Derrick Thornton he relates he feels good he had a bloody bowel movement this morning, relates no abdominal pain  Objective:    Vitals:   05/06/18 0333 05/06/18 0400 05/06/18 0500 05/06/18 0600  BP:  124/85  128/65  Pulse:  88 89 83  Resp:  16 12 14   Temp: 98.5 F (36.9 C)     TempSrc: Oral     SpO2:  98% 99% 96%  Weight:   67.6 kg   Height:        Intake/Output Summary (Last 24 hours) at 05/06/2018 0709 Last data filed at 05/06/2018 0541 Gross per 24 hour  Intake 2883.2 ml  Output 300 ml  Net 2583.2 ml   Filed Weights   05/05/18 1607 05/05/18 2034 05/06/18 0500  Weight: 81.6 kg 69.1 kg 67.6 kg    Exam: General exam: In no acute distress. Respiratory system: Good air movement and clear to auscultation. Cardiovascular system: S1 & S2 heard, RRR.  Gastrointestinal system: Abdomen is nondistended, soft and nontender.  Central nervous system: Alert and oriented. No focal neurological deficits. Extremities: No pedal edema. Skin: No rashes, lesions or ulcers Psychiatry: Judgement and insight appear normal. Mood & affect appropriate.    Data Reviewed:    Labs: Basic Metabolic Panel: Recent Labs  Lab 05/05/18 1647 05/05/18 2244 05/06/18 0319  NA 136 139  --   K 3.6 3.8  --  CL 104 106  --   CO2 24 24  --   GLUCOSE 152* 140* 119*  BUN 24* 24*  --   CREATININE 1.16 1.12  --   CALCIUM 8.4* 8.3*  --    GFR Estimated Creatinine Clearance: 58.7 mL/min (by C-G formula based on SCr of 1.12 mg/dL). Liver Function Tests: Recent Labs  Lab 05/05/18 1647 05/05/18 2244  AST 27 21  ALT 16 16  ALKPHOS 37* 38  BILITOT 0.7 0.9  PROT 6.3* 5.9*  ALBUMIN 3.6 3.4*   No results for input(s): LIPASE, AMYLASE in the last 168 hours. No results for input(s): AMMONIA in the last 168 hours. Coagulation profile Recent Labs  Lab 05/05/18 1647 05/05/18 2244  INR 1.04 1.07    CBC: Recent Labs  Lab 05/05/18 1647 05/05/18 2244  WBC 7.1 10.9*   NEUTROABS 4.4  --   HGB 7.3* 7.8*  HCT 23.3* 24.4*  MCV 103.1* 103.4*  PLT 258 273   Cardiac Enzymes: No results for input(s): CKTOTAL, CKMB, CKMBINDEX, TROPONINI in the last 168 hours. BNP (last 3 results) No results for input(s): PROBNP in the last 8760 hours. CBG: No results for input(s): GLUCAP in the last 168 hours. D-Dimer: No results for input(s): DDIMER in the last 72 hours. Hgb A1c: No results for input(s): HGBA1C in the last 72 hours. Lipid Profile: No results for input(s): CHOL, HDL, LDLCALC, TRIG, CHOLHDL, LDLDIRECT in the last 72 hours. Thyroid function studies: No results for input(s): TSH, T4TOTAL, T3FREE, THYROIDAB in the last 72 hours.  Invalid input(s): FREET3 Anemia work up: No results for input(s): VITAMINB12, FOLATE, FERRITIN, TIBC, IRON, RETICCTPCT in the last 72 hours. Sepsis Labs: Recent Labs  Lab 05/05/18 1647 05/05/18 2244  WBC 7.1 10.9*   Microbiology Recent Results (from the past 240 hour(s))  MRSA PCR Screening     Status: None   Collection Time: 05/05/18  9:49 PM  Result Value Ref Range Status   MRSA by PCR NEGATIVE NEGATIVE Final    Comment:        The GeneXpert MRSA Assay (FDA approved for NASAL specimens only), is one component of a comprehensive MRSA colonization surveillance program. It is not intended to diagnose MRSA infection nor to guide or monitor treatment for MRSA infections. Performed at [facility], [address], [place], [place] [address]      Medications:   . sodium chloride   Intravenous Once  . sodium chloride   Intravenous Once   Continuous Infusions: . sodium chloride 125 mL/hr at 05/05/18 2240  . piperacillin-tazobactam (ZOSYN)  IV 3.375 g (05/06/18 0541)      LOS: 1 day   Marinda Elk  [facility]   *Please refer to [email or web address], password [email or web address] to get updated schedule on who will round on this patient, as hospitalists switch teams weekly. If 7PM-7AM, please  contact night-coverage at [email or web address], password [email or web address] for any overnight needs.  05/06/2018, 7:09 AM

## 2018-05-06 NOTE — Consult Note (Signed)
[facility] CONSULT Reason for consult: Lower GI bleed Referring Physician: [facility] PCP and GI in [place]  Derrick Thornton is an 72 y.o. male.  HPI:  Patient has diabetes and is undergone previous colonoscopies in [place] at [facility] last about 1 year ago with no polyps and diverticulosis and hemorrhoids.  He has intermittent heartburn and takes omeprazole on a as needed basis.  He has had slight vague abdominal discomfort for a couple of days with no change in bowel habits except 1 day of constipation and then had 3 large bowel movements with bright red blood went to the emergency room in [facility] apparently had a CT showing thickening although I do not have that report available and then came to [facility] ER.  He has had no further bleeding no abdominal pain currently.  Medical history positive for diabetes.  He has been taking diclofenac and indomethacin.  His BUN and creatinine are normal and he has been on omeprazole.  Past Medical History:  Diagnosis Date  . Diabetes mellitus without complication (HCC)   . Diverticulosis   . Hypertension     Past Surgical History:  Procedure Laterality Date  . ACHILLES TENDON REPAIR    . KNEE ARTHROSCOPY      History reviewed. No pertinent family history.  Social History:  reports that he has never smoked. He has never used smokeless tobacco. He reports that he drank alcohol. He reports that he does not use drugs.  Allergies: No Known Allergies  Medications; Prior to Admission medications   Medication Sig Start Date End Date Taking? Authorizing Provider  aspirin EC 81 MG tablet Take 81 mg by mouth daily as needed (blood thinner).    Yes [provider]  fenofibrate 160 MG tablet Take 160 mg by mouth daily.    Yes [provider]  ferrous sulfate 325 (65 FE) MG tablet Take 325 mg by mouth 2 (two) times daily with a meal.  10/04/17  Yes [provider]  glimepiride (AMARYL) 4  MG tablet Take 4 mg by mouth daily as needed (for high sugar).    Yes [provider]  indomethacin (INDOCIN) 25 MG capsule TAKE ONE CAPSULE BY MOUTH TWICE DAILY WITH A MEAL( LIMIT TO 5 DAYS TREATMENT) Patient taking differently: Take 25 mg by mouth 3 (three) times daily as needed for mild pain.  04/06/18  Yes Tyrell Antonio, MD  metFORMIN (GLUCOPHAGE) 500 MG tablet Take 500 mg by mouth daily.    Yes [provider]  Omega-3 Fatty Acids (OMEGA-3 PLUS) 1000 MG CAPS Take 2,000 mg by mouth daily.   Yes [provider]  OVER THE COUNTER MEDICATION Take 2 tablets by mouth daily. MK7 Reverse Arterial Calcification   Yes [provider]  predniSONE (DELTASONE) 50 MG tablet Take 1 tablet daily with food for 5 days until finished 01/26/18  Yes Tyrell Antonio, MD  rosuvastatin (CRESTOR) 10 MG tablet Take 10 mg by mouth daily.   Yes [provider]  Saw Palmetto 450 MG CAPS Take 450 mg by mouth daily.   Yes [provider]  tadalafil (CIALIS) 5 MG tablet Take 5 mg by mouth daily as needed for erectile dysfunction.  08/20/15  Yes [provider]  diclofenac (VOLTAREN) 75 MG EC tablet Take 1 tablet (75 mg total) by mouth 2 (two) times daily. Take with food, only for 2 weeks Patient not taking: Reported on 05/05/2018 01/26/18   Tyrell Antonio, MD   .  sodium chloride   Intravenous Once  . sodium chloride   Intravenous Once   PRN Meds  Results for orders placed or performed during the hospital encounter of 05/05/18 (from the past 48 hour(s))  Occult blood card to lab, stool Provider will collect     Status: Abnormal   Collection Time: 05/05/18  4:30 PM  Result Value Ref Range   Fecal Occult Bld POSITIVE (A) NEGATIVE    Comment: Performed at [facility], [address] [place], [place] [address]  Comprehensive metabolic panel     Status: Abnormal   Collection Time: 05/05/18  4:47 PM  Result Value Ref Range   Sodium 136 135 - 145  mmol/L   Potassium 3.6 3.5 - 5.1 mmol/L   Chloride 104 98 - 111 mmol/L   CO2 24 22 - 32 mmol/L   Glucose, Bld 152 (H) 70 - 99 mg/dL   BUN 24 (H) 8 - 23 mg/dL   Creatinine, Ser 6.04 0.61 - 1.24 mg/dL   Calcium 8.4 (L) 8.9 - 10.3 mg/dL   Total Protein 6.3 (L) 6.5 - 8.1 g/dL   Albumin 3.6 3.5 - 5.0 g/dL   AST 27 15 - 41 U/L   ALT 16 0 - 44 U/L   Alkaline Phosphatase 37 (L) 38 - 126 U/L   Total Bilirubin 0.7 0.3 - 1.2 mg/dL   GFR calc non Af Amer >60 >60 mL/min   GFR calc Af Amer >60 >60 mL/min   Anion gap 8 5 - 15    Comment: Performed at [facility], [address] [place], [place] [address]  Protime-INR     Status: None   Collection Time: 05/05/18  4:47 PM  Result Value Ref Range   Prothrombin Time 13.5 11.4 - 15.2 seconds   INR 1.04     Comment: Performed at [facility], [address] [place], [place] [address]  CBC with Differential     Status: Abnormal   Collection Time: 05/05/18  4:47 PM  Result Value Ref Range   WBC 7.1 4.0 - 10.5 K/uL   RBC 2.26 (L) 4.22 - 5.81 MIL/uL   Hemoglobin 7.3 (L) 13.0 - 17.0 g/dL   HCT 78.2 (L) 95.6 - 21.3 %   MCV 103.1 (H) 80.0 - 100.0 fL   MCH 32.3 26.0 - 34.0 pg   MCHC 31.3 30.0 - 36.0 g/dL   RDW 08.6 57.8 - 46.9 %   Platelets 258 150 - 400 K/uL   nRBC 0.0 0.0 - 0.2 %   Neutrophils Relative % 62 %   Neutro Abs 4.4 1.7 - 7.7 K/uL   Lymphocytes Relative 27 %   Lymphs Abs 1.9 0.7 - 4.0 K/uL   Monocytes Relative 9 %   Monocytes Absolute 0.7 0.1 - 1.0 K/uL   Eosinophils Relative 1 %   Eosinophils Absolute 0.1 0.0 - 0.5 K/uL   Basophils Relative 1 %   Basophils Absolute 0.0 0.0 - 0.1 K/uL   Immature Granulocytes 0 %   Abs Immature Granulocytes 0.01 0.00 - 0.07 K/uL    Comment: Performed at [facility], [address] [place], [place] [address]  Type and screen Ordered by PROVIDER DEFAULT     Status: None   Collection Time: 05/05/18  6:39 PM  Result Value Ref Range   ABO/RH(D) NN^NOT NEEDED     Antibody Screen NOT NEEDED    Sample Expiration 05/08/2018  Unit Number [id number]    Blood Component Type RED CELLS,LR    Unit division 00    Status of Unit ISSUED,FINAL    Unit tag comment VERBAL ORDERS PER DR NANAVATI    Transfusion Status OK TO TRANSFUSE    Crossmatch Result      NOT NEEDED Performed at [facility], [address] [place], [place] [address]   MRSA PCR Screening     Status: None   Collection Time: 05/05/18  9:49 PM  Result Value Ref Range   MRSA by PCR NEGATIVE NEGATIVE    Comment:        The GeneXpert MRSA Assay (FDA approved for NASAL specimens only), is one component of a comprehensive MRSA colonization surveillance program. It is not intended to diagnose MRSA infection nor to guide or monitor treatment for MRSA infections. Performed at [facility], [address], [place], [place] [address]   ABO/Rh     Status: None   Collection Time: 05/05/18 10:37 PM  Result Value Ref Range   ABO/RH(D)      A POS Performed at [facility], [address], [place], [place] [address]   Comprehensive metabolic panel     Status: Abnormal   Collection Time: 05/05/18 10:44 PM  Result Value Ref Range   Sodium 139 135 - 145 mmol/L   Potassium 3.8 3.5 - 5.1 mmol/L   Chloride 106 98 - 111 mmol/L   CO2 24 22 - 32 mmol/L   Glucose, Bld 140 (H) 70 - 99 mg/dL   BUN 24 (H) 8 - 23 mg/dL   Creatinine, Ser 5.64 0.61 - 1.24 mg/dL   Calcium 8.3 (L) 8.9 - 10.3 mg/dL   Total Protein 5.9 (L) 6.5 - 8.1 g/dL   Albumin 3.4 (L) 3.5 - 5.0 g/dL   AST 21 15 - 41 U/L   ALT 16 0 - 44 U/L   Alkaline Phosphatase 38 38 - 126 U/L   Total Bilirubin 0.9 0.3 - 1.2 mg/dL   GFR calc non Af Amer >60 >60 mL/min   GFR calc Af Amer >60 >60 mL/min   Anion gap 9 5 - 15    Comment: Performed at [facility], [address], [place], [place] [address]  CBC     Status: Abnormal   Collection Time: 05/05/18 10:44 PM  Result Value  Ref Range   WBC 10.9 (H) 4.0 - 10.5 K/uL   RBC 2.36 (L) 4.22 - 5.81 MIL/uL   Hemoglobin 7.8 (L) 13.0 - 17.0 g/dL   HCT 18.8 (L) 41.6 - 60.6 %   MCV 103.4 (H) 80.0 - 100.0 fL   MCH 33.1 26.0 - 34.0 pg   MCHC 32.0 30.0 - 36.0 g/dL   RDW 30.1 60.1 - 09.3 %   Platelets 273 150 - 400 K/uL   nRBC 0.0 0.0 - 0.2 %    Comment: Performed at [facility], [address], [place], [place] [address]  APTT     Status: None   Collection Time: 05/05/18 10:44 PM  Result Value Ref Range   aPTT 29 24 - 36 seconds    Comment: Performed at [facility], [address] [place], [place] [address]  Protime-INR     Status: None   Collection Time: 05/05/18 10:44 PM  Result Value Ref Range   Prothrombin Time 13.8 11.4 - 15.2 seconds   INR 1.07     Comment: Performed  at [facility], [address] [place], [place] [address]  Type and screen [facility]     Status: None (Preliminary result)   Collection Time: 05/05/18 10:44 PM  Result Value Ref Range   ABO/RH(D) A POS    Antibody Screen NEG    Sample Expiration 05/08/2018    Unit Number [id number]    Blood Component Type RED CELLS,LR    Unit division 00    Status of Unit ALLOCATED    Transfusion Status OK TO TRANSFUSE    Crossmatch Result Compatible    Unit Number [id number]    Blood Component Type RED CELLS,LR    Unit division 00    Status of Unit ISSUED    Transfusion Status OK TO TRANSFUSE    Crossmatch Result      Compatible Performed at [facility], [address] [place], [place] [address]   Prepare RBC     Status: None   Collection Time: 05/05/18 10:44 PM  Result Value Ref Range   Order Confirmation      ORDER PROCESSED BY BLOOD BANK Performed at [facility], [address] [place], [place] [address]   Glucose, random     Status: Abnormal   Collection Time: 05/06/18  3:19 AM  Result Value Ref Range   Glucose,  Bld 119 (H) 70 - 99 mg/dL    Comment: Performed at [facility], [address] [place], [place] [address]  Hemoglobin and hematocrit, blood     Status: Abnormal   Collection Time: 05/06/18  7:00 AM  Result Value Ref Range   Hemoglobin 7.6 (L) 13.0 - 17.0 g/dL   HCT 44.024.1 (L) 10.239.0 - 72.552.0 %    Comment: Performed at [facility], [address] [place], [place] [address]  Glucose, capillary     Status: Abnormal   Collection Time: 05/06/18  8:01 AM  Result Value Ref Range   Glucose-Capillary 125 (H) 70 - 99 mg/dL  Glucose, capillary     Status: Abnormal   Collection Time: 05/06/18 11:46 AM  Result Value Ref Range   Glucose-Capillary 120 (H) 70 - 99 mg/dL   Comment 1 Notify RN    Comment 2 Document in Chart     No results found.             Blood pressure 123/75, pulse (!) 109, temperature 98.8 F (37.1 C), temperature source Oral, resp. rate 17, height 5\' 9"  (1.753 m), weight 67.6 kg, SpO2 100 %.  Physical exam:   General--Pleasant white male in no distress ENT--nonicteric Neck--supple no lymphadenopathy Heart--regular rate and rhythm no murmurs or gallops Lungs--clear Abdomen--soft and completely nontender good bowel sounds Psych--alert and oriented appears to have normal judgment and insight   Assessment: 1.  Acute GI bleeding.  Probable lower GI does have history of diverticulosis has been on omeprazole on a fairly regular basis and BUN and creatinine are essence normal.  He has however taken diclofenac and Indocin so upper GI source is possible  Plan: We will go ahead and start patient on clear liquids and MiraLAX and follow him clinically.  If the bleeding continues he may need further endoscopic evaluation in the hospital questionably upper as well as lower.   Tresea MallJames L Makynlie Rossini 05/06/2018, 12:23 PM   This note was created using voice recognition software and minor errors may Have occurred unintentionally. Pager:  [phone] If no answer or after hours call [phone]

## 2018-05-06 NOTE — Progress Notes (Signed)
Pt had a 2nd large bright red bowel movement of the day and became very weak and diaphoretic, BP dropped to 78/21. Paged MD and got orders for a 2L bolus, and to transfuse 2 units. Bolus started and waiting for blood, will continue to monitor.

## 2018-05-06 NOTE — Progress Notes (Signed)
2L bolus completed. Still waiting on blood to be ready. Paged GI to make them aware of the change in status and got orders to make pt NPO except for ice chips and to get a BMP along with the H&H once the blood is complete. BP has increased to 119/64. Will continue to monitor.

## 2018-05-06 NOTE — Progress Notes (Signed)
Pharmacy Antibiotic Note  Derrick Thornton is a 72 y.o. male admitted on 05/05/2018 with colon (possible ischemic colitis).  Pharmacy has been consulted for zosyn dosing.  Plan: Zosyn 3.375g IV q8h (4 hour infusion).  Height: 5\' 9"  (175.3 cm) Weight: 152 lb 5.4 oz (69.1 kg) IBW/kg (Calculated) : 70.7  Temp (24hrs), Avg:98.6 F (37 C), Min:98.2 F (36.8 C), Max:99 F (37.2 C)  Recent Labs  Lab 05/05/18 1647 05/05/18 2244  WBC 7.1 10.9*  CREATININE 1.16 1.12    Estimated Creatinine Clearance: 60 mL/min (by C-G formula based on SCr of 1.12 mg/dL).    No Known Allergies  Antimicrobials this admission: Zosyn 05/06/2018 >>   Dose adjustments this admission: -  Microbiology results: -  Thank you for allowing pharmacy to be a part of this patient's care.  Aleene DavidsonGrimsley Jr, Vallerie Hentz Crowford 05/06/2018 4:32 AM

## 2018-05-07 LAB — BPAM RBC
Blood Product Expiration Date: 201912272359
Blood Product Expiration Date: 201912272359
ISSUE DATE / TIME: 201912070039
ISSUE DATE / TIME: 201912071604
Unit Type and Rh: 6200
Unit Type and Rh: 6200

## 2018-05-07 LAB — GLUCOSE, CAPILLARY
Glucose-Capillary: 101 mg/dL — ABNORMAL HIGH (ref 70–99)
Glucose-Capillary: 154 mg/dL — ABNORMAL HIGH (ref 70–99)
Glucose-Capillary: 72 mg/dL (ref 70–99)
Glucose-Capillary: 83 mg/dL (ref 70–99)
Glucose-Capillary: 90 mg/dL (ref 70–99)
Glucose-Capillary: 99 mg/dL (ref 70–99)

## 2018-05-07 LAB — HEMOGLOBIN AND HEMATOCRIT, BLOOD
HCT: 22.9 % — ABNORMAL LOW (ref 39.0–52.0)
HCT: 23.7 % — ABNORMAL LOW (ref 39.0–52.0)
Hemoglobin: 7.5 g/dL — ABNORMAL LOW (ref 13.0–17.0)
Hemoglobin: 7.8 g/dL — ABNORMAL LOW (ref 13.0–17.0)

## 2018-05-07 LAB — TYPE AND SCREEN
ABO/RH(D): A POS
Antibody Screen: NEGATIVE
Unit division: 0
Unit division: 0

## 2018-05-07 LAB — GLUCOSE, RANDOM: Glucose, Bld: 107 mg/dL — ABNORMAL HIGH (ref 70–99)

## 2018-05-07 LAB — HIV ANTIBODY (ROUTINE TESTING W REFLEX): HIV Screen 4th Generation wRfx: NONREACTIVE

## 2018-05-07 NOTE — Progress Notes (Signed)
[facility] PROGRESS NOTE    Progress Note  Derrick CorporalCharles A Thornton  ZOX:[id number] DOB: 01/28/1946 DOA: 05/05/2018 PCP: Darryl Lentaylor, Amanda, PA-C     Brief Narrative:   Derrick Thornton is an 72 y.o. male past medical history of hypertension diverticulosis internal bleeding hemorrhoids who presents with bright red blood per rectum that began about 3 days prior to admission when she was transfused 1 unit of packed red blood cells in the ED  Assessment/Plan:   Lower GI bleed likely due to Diverticulosis/Acute blood loss anemia Previous hemoglobins range around 11.9 on admission 7.3, s/p 4 unit of packed red blood cells in hemoglobin today is 8.3.  Hemoglobin has remained stable. GI recommended clears, MiraLAX and observation. Likely a diverticular bleed. Continue to trend hemoglobin.  Diabetes mellitus type 2: With an A1c of 5. Continue to hold oral hypoglycemic agents. Keep n.p.o. continue sliding scale with CBGs every 4 hours.  Essential HTN (hypertension): Hold home dose of statins and antihypertensive medication      DVT prophylaxis: SCd's Family Communication:none Disposition Plan/Barrier to D/C: home once gi bleed subsides Code Status:     Code Status Orders  (From admission, onward)         Start     Ordered   05/05/18 2223  Full code  Continuous     05/05/18 2222        Code Status History    This patient has a current code status but no historical code status.        IV Access:    Peripheral IV   Procedures and diagnostic studies:   No results found.   Medical Consultants:    None.  Anti-Infectives:   Zosyn stopped on 12.7.2019  Subjective:    Derrick Thornton no further bloody bowel movements since yesterday afternoon.  Objective:    Vitals:   05/07/18 0100 05/07/18 0200 05/07/18 0300 05/07/18 0400  BP: 123/60 129/65 131/68 (!) 148/68  Pulse: 80 82 87 79  Resp: 14 15 14 14   Temp:    98.7 F (37.1 C)  TempSrc:    Oral  SpO2:  97% 98% 98% 99%  Weight:      Height:        Intake/Output Summary (Last 24 hours) at 05/07/2018 0720 Last data filed at 05/07/2018 0400 Gross per 24 hour  Intake 3784.63 ml  Output 950 ml  Net 2834.63 ml   Filed Weights   05/05/18 1607 05/05/18 2034 05/06/18 0500  Weight: 81.6 kg 69.1 kg 67.6 kg    Exam: General exam: In no acute distress. Respiratory system: Good air movement and clear to auscultation. Cardiovascular system: S1 & S2 heard, RRR.  Gastrointestinal system: Abdomen is nondistended, soft and nontender.  Central nervous system: Alert and oriented. No focal neurological deficits. Extremities: No pedal edema. Skin: No rashes, lesions or ulcers Psychiatry: Judgement and insight appear normal. Mood & affect appropriate.    Data Reviewed:    Labs: Basic Metabolic Panel: Recent Labs  Lab 05/05/18 1647 05/05/18 2244 05/06/18 0319 05/06/18 2253 05/07/18 0357  NA 136 139  --  140  --   K 3.6 3.8  --  3.8  --   CL 104 106  --  114*  --   CO2 24 24  --  21*  --   GLUCOSE 152* 140* 119* 105* 107*  BUN 24* 24*  --  18  --   CREATININE 1.16 1.12  --  1.01  --  CALCIUM 8.4* 8.3*  --  7.5*  --    GFR Estimated Creatinine Clearance: 65.1 mL/min (by C-G formula based on SCr of 1.01 mg/dL). Liver Function Tests: Recent Labs  Lab 05/05/18 1647 05/05/18 2244  AST 27 21  ALT 16 16  ALKPHOS 37* 38  BILITOT 0.7 0.9  PROT 6.3* 5.9*  ALBUMIN 3.6 3.4*   No results for input(s): LIPASE, AMYLASE in the last 168 hours. No results for input(s): AMMONIA in the last 168 hours. Coagulation profile Recent Labs  Lab 05/05/18 1647 05/05/18 2244  INR 1.04 1.07    CBC: Recent Labs  Lab 05/05/18 1647 05/05/18 2244 05/06/18 0700 05/06/18 2253  WBC 7.1 10.9*  --   --   NEUTROABS 4.4  --   --   --   HGB 7.3* 7.8* 7.6* 8.3*  HCT 23.3* 24.4* 24.1* 25.9*  MCV 103.1* 103.4*  --   --   PLT 258 273  --   --    Cardiac Enzymes: No results for input(s): CKTOTAL, CKMB,  CKMBINDEX, TROPONINI in the last 168 hours. BNP (last 3 results) No results for input(s): PROBNP in the last 8760 hours. CBG: Recent Labs  Lab 05/06/18 1146 05/06/18 1553 05/06/18 1919 05/06/18 2329 05/07/18 0307  GLUCAP 120* 128* 101* 92 99   D-Dimer: No results for input(s): DDIMER in the last 72 hours. Hgb A1c: No results for input(s): HGBA1C in the last 72 hours. Lipid Profile: No results for input(s): CHOL, HDL, LDLCALC, TRIG, CHOLHDL, LDLDIRECT in the last 72 hours. Thyroid function studies: No results for input(s): TSH, T4TOTAL, T3FREE, THYROIDAB in the last 72 hours.  Invalid input(s): FREET3 Anemia work up: No results for input(s): VITAMINB12, FOLATE, FERRITIN, TIBC, IRON, RETICCTPCT in the last 72 hours. Sepsis Labs: Recent Labs  Lab 05/05/18 1647 05/05/18 2244  WBC 7.1 10.9*   Microbiology Recent Results (from the past 240 hour(s))  MRSA PCR Screening     Status: None   Collection Time: 05/05/18  9:49 PM  Result Value Ref Range Status   MRSA by PCR NEGATIVE NEGATIVE Final    Comment:        The GeneXpert MRSA Assay (FDA approved for NASAL specimens only), is one component of a comprehensive MRSA colonization surveillance program. It is not intended to diagnose MRSA infection nor to guide or monitor treatment for MRSA infections. Performed at [facility], [address] [place], [place] [address]      Medications:   . sodium chloride   Intravenous Once  . sodium chloride   Intravenous Once  . polyethylene glycol  17 g Oral BID   Continuous Infusions: . sodium chloride 75 mL/hr at 05/07/18 0400      LOS: 2 days   Marinda Elk  [facility]   *Please refer to [email or web address], password [email or web address] to get updated schedule on who will round on this patient, as hospitalists switch teams weekly. If 7PM-7AM, please contact night-coverage at [email or web address], password [email or web address] for any overnight needs.  05/07/2018, 7:20 AM

## 2018-05-07 NOTE — Progress Notes (Signed)
[facility] PROGRESS NOTE Subjective Patient has remained on sips of water.  He has not had any further bleeding.  No further bowel movements.  Hemoglobin is been relatively stable  Objective: Vital signs in last 24 hours: Temp:  [98.3 F (36.8 C)-99.2 F (37.3 C)] 98.7 F (37.1 C) (12/08 0400) Pulse Rate:  [76-92] 80 (12/08 1600) Resp:  [10-20] 20 (12/08 1600) BP: (109-153)/(60-90) 122/63 (12/08 1600) SpO2:  [97 %-100 %] 98 % (12/08 1600) Last BM Date: 05/06/18  Intake/Output from previous day: 12/07 0701 - 12/08 0700 In: 4009.7 [I.V.:3128.7; Blood:881] Out: 950 [Urine:950] Intake/Output this shift: Total I/O In: -  Out: 700 [Urine:700]  PE: General--no acute distress  Abdomen--soft and nontender  Lab Results: Recent Labs    05/05/18 1647 05/05/18 2244 05/06/18 0700 05/06/18 2253 05/07/18 0719  WBC 7.1 10.9*  --   --   --   HGB 7.3* 7.8* 7.6* 8.3* 7.5*  HCT 23.3* 24.4* 24.1* 25.9* 22.9*  PLT 258 273  --   --   --    BMET Recent Labs    05/05/18 1647 05/05/18 2244 05/06/18 2253  NA 136 139 140  K 3.6 3.8 3.8  CL 104 106 114*  CO2 24 24 21*  CREATININE 1.16 1.12 1.01   LFT Recent Labs    05/05/18 1647 05/05/18 2244  PROT 6.3* 5.9*  AST 27 21  ALT 16 16  ALKPHOS 37* 38  BILITOT 0.7 0.9   PT/INR Recent Labs    05/05/18 1647 05/05/18 2244  LABPROT 13.5 13.8  INR 1.04 1.07   PANCREAS No results for input(s): LIPASE in the last 72 hours.       Studies/Results: No results found.  Medications: I have reviewed the patient's current medications.  Assessment:   1.  Acute GI bleeding.  Probably lower GI has been on omeprazole on a fairly regular basis and has normal BUN and creatinine.  Also has a history of diverticulosis.  He has been taking diclofenac and Indocin so an upper GI source is possible.  He has been clinically stable since admission at this point I think I would treat this is a lower GI bleed.   Plan: We will go  ahead and start him on clear liquids and MiraLAX.  Follow his blood count and if he does well we could go ahead and advance to full liquids.  Soon is stable he could be discharged and followed as outpatient.   Tresea MallJames L Besan Ketchem 05/07/2018, 5:04 PM  This note was created using voice recognition software. Minor errors may Have occurred unintentionally.  Pager: [phone] If no answer or after hours call [phone]

## 2018-05-08 LAB — GLUCOSE, CAPILLARY
Glucose-Capillary: 96 mg/dL (ref 70–99)
Glucose-Capillary: 90 mg/dL (ref 70–99)
Glucose-Capillary: 188 mg/dL — ABNORMAL HIGH (ref 70–99)
Glucose-Capillary: 91 mg/dL (ref 70–99)
Glucose-Capillary: 122 mg/dL — ABNORMAL HIGH (ref 70–99)

## 2018-05-08 LAB — HEMOGLOBIN AND HEMATOCRIT, BLOOD
HCT: 21.2 % — ABNORMAL LOW (ref 39.0–52.0)
HCT: 27.9 % — ABNORMAL LOW (ref 39.0–52.0)
HCT: 26.9 % — ABNORMAL LOW (ref 39.0–52.0)
Hemoglobin: 6.8 g/dL — CL (ref 13.0–17.0)
Hemoglobin: 9 g/dL — ABNORMAL LOW (ref 13.0–17.0)
Hemoglobin: 8.7 g/dL — ABNORMAL LOW (ref 13.0–17.0)

## 2018-05-08 LAB — GLUCOSE, RANDOM: Glucose, Bld: 97 mg/dL (ref 70–99)

## 2018-05-08 LAB — PREPARE RBC (CROSSMATCH)

## 2018-05-08 MED ORDER — SODIUM CHLORIDE 0.9% IV SOLUTION: Freq: Once | INTRAVENOUS | Status: DC

## 2018-05-08 NOTE — Progress Notes (Addendum)
[facility] PROGRESS NOTE    Progress Note  Derrick Thornton  ZOX:[id number] DOB: 05/23/1946 DOA: 05/05/2018 PCP: Darryl Lent, PA-C     Brief Narrative:   Derrick Thornton is an 72 y.o. male past medical history of hypertension diverticulosis internal bleeding hemorrhoids who presents with bright red blood per rectum that began about 3 days prior to admission when she was transfused 1 unit of packed red blood cells in the ED  Assessment/Plan:   Lower GI bleed likely due to Diverticulosis/Acute blood loss anemia: Likely a diverticular bleed, he is status post 4 units of packed red blood cells his hemoglobin today 6.8, he is probably equilibrating will transfuse 1 additional unit of packed red blood cells check an H&H post transfusion. He has had no further melanotic or bright red blood per rectum. Advance diet GI is on board and appreciate assistance. Transfer to MedSurg  Diabetes mellitus type 2: With an A1c of 5. On clears blood glucose well controlled.  Essential HTN (hypertension): Hold home dose of statins and antihypertensive medication      DVT prophylaxis: SCd's Family Communication:none Disposition Plan/Barrier to D/C: Transfer to MedSurg, can go home once GI bleed subsides Code Status:     Code Status Orders  (From admission, onward)         Start     Ordered   05/05/18 2223  Full code  Continuous     05/05/18 2222        Code Status History    This patient has a current code status but no historical code status.        IV Access:    Peripheral IV   Procedures and diagnostic studies:   No results found.   Medical Consultants:    None.  Anti-Infectives:   Zosyn stopped on 12.7.2019  Subjective:    [name] bloody bowel movements.  No complaints.  Objective:    Vitals:   05/08/18 0000 05/08/18 0341 05/08/18 0400 05/08/18 0600  BP: 136/72  115/64 117/65  Pulse: 78  66 73  Resp: (!) 26  17 13   Temp: 98.1 F  (36.7 C) 98.1 F (36.7 C)    TempSrc: Oral Oral    SpO2: 99%  98% 96%  Weight:      Height:        Intake/Output Summary (Last 24 hours) at 05/08/2018 0709 Last data filed at 05/08/2018 0300 Gross per 24 hour  Intake 1731.74 ml  Output 2575 ml  Net -843.26 ml   Filed Weights   05/05/18 1607 05/05/18 2034 05/06/18 0500  Weight: 81.6 kg 69.1 kg 67.6 kg    Exam: General exam: In no acute distress. Respiratory system: Good air movement and clear to auscultation. Cardiovascular system: S1 & S2 heard, RRR.  Gastrointestinal system: Abdomen is nondistended, soft and nontender.  Central nervous system: Alert and oriented. No focal neurological deficits. Extremities: No pedal edema. Skin: No rashes, lesions or ulcers Psychiatry: Judgement and insight appear normal. Mood & affect appropriate.    Data Reviewed:    Labs: Basic Metabolic Panel: Recent Labs  Lab 05/05/18 1647 05/05/18 2244 05/06/18 0319 05/06/18 2253 05/07/18 0357 05/08/18 0333  NA 136 139  --  140  --   --   K 3.6 3.8  --  3.8  --   --   CL 104 106  --  114*  --   --   CO2 24 24  --  21*  --   --  GLUCOSE 152* 140* 119* 105* 107* 97  BUN 24* 24*  --  18  --   --   CREATININE 1.16 1.12  --  1.01  --   --   CALCIUM 8.4* 8.3*  --  7.5*  --   --    GFR Estimated Creatinine Clearance: 65.1 mL/min (by C-G formula based on SCr of 1.01 mg/dL). Liver Function Tests: Recent Labs  Lab 05/05/18 1647 05/05/18 2244  AST 27 21  ALT 16 16  ALKPHOS 37* 38  BILITOT 0.7 0.9  PROT 6.3* 5.9*  ALBUMIN 3.6 3.4*   No results for input(s): LIPASE, AMYLASE in the last 168 hours. No results for input(s): AMMONIA in the last 168 hours. Coagulation profile Recent Labs  Lab 05/05/18 1647 05/05/18 2244  INR 1.04 1.07    CBC: Recent Labs  Lab 05/05/18 1647 05/05/18 2244 05/06/18 0700 05/06/18 2253 05/07/18 0719 05/07/18 1857  WBC 7.1 10.9*  --   --   --   --   NEUTROABS 4.4  --   --   --   --   --   HGB  7.3* 7.8* 7.6* 8.3* 7.5* 7.8*  HCT 23.3* 24.4* 24.1* 25.9* 22.9* 23.7*  MCV 103.1* 103.4*  --   --   --   --   PLT 258 273  --   --   --   --    Cardiac Enzymes: No results for input(s): CKTOTAL, CKMB, CKMBINDEX, TROPONINI in the last 168 hours. BNP (last 3 results) No results for input(s): PROBNP in the last 8760 hours. CBG: Recent Labs  Lab 05/07/18 1157 05/07/18 1627 05/07/18 2017 05/07/18 2354 05/08/18 0329  GLUCAP 101* 72 154* 83 96   D-Dimer: No results for input(s): DDIMER in the last 72 hours. Hgb A1c: No results for input(s): HGBA1C in the last 72 hours. Lipid Profile: No results for input(s): CHOL, HDL, LDLCALC, TRIG, CHOLHDL, LDLDIRECT in the last 72 hours. Thyroid function studies: No results for input(s): TSH, T4TOTAL, T3FREE, THYROIDAB in the last 72 hours.  Invalid input(s): FREET3 Anemia work up: No results for input(s): VITAMINB12, FOLATE, FERRITIN, TIBC, IRON, RETICCTPCT in the last 72 hours. Sepsis Labs: Recent Labs  Lab 05/05/18 1647 05/05/18 2244  WBC 7.1 10.9*   Microbiology Recent Results (from the past 240 hour(s))  MRSA PCR Screening     Status: None   Collection Time: 05/05/18  9:49 PM  Result Value Ref Range Status   MRSA by PCR NEGATIVE NEGATIVE Final    Comment:        The GeneXpert MRSA Assay (FDA approved for NASAL specimens only), is one component of a comprehensive MRSA colonization surveillance program. It is not intended to diagnose MRSA infection nor to guide or monitor treatment for MRSA infections. Performed at [facility], [address] [place], [place] [address]      Medications:   . sodium chloride   Intravenous Once  . sodium chloride   Intravenous Once  . polyethylene glycol  17 g Oral BID   Continuous Infusions: . sodium chloride 75 mL/hr at 05/08/18 0530      LOS: 3 days   Marinda ElkAbraham Feliz Ortiz  [facility]   *Please refer to [email or web address], password [email or web address] to get updated  schedule on who will round on this patient, as hospitalists switch teams weekly. If 7PM-7AM, please contact night-coverage at [email or web address], password [email or web address] for any overnight needs.  05/08/2018, 7:09 AM

## 2018-05-08 NOTE — Progress Notes (Signed)
CRITICAL VALUE ALERT  Critical Value:  Hgb 6.8  Date & Time Notied: 05/08/2018 16100713 Provider Notified: Dr. David StallFeliz Ortiz Orders Received/Actions taken:Awaiting

## 2018-05-08 NOTE — Progress Notes (Signed)
Landry Corporalharles A Roehr 9:04 AM  Subjective: Patient seen and examined and case discussed with my partner Dr. Randa EvensEdwards and is had no further bleeding and we discussed his diverticuli and answered his questions  Objective: Vital signs stable afebrile no acute distress abdomen is soft nontender hemoglobin dropped a little  Assessment: Diverticular bleeding currently stable  Plan: Please let us know if we can be of any further assistance with this hospital stay otherwise slowly advance diet and have him follow-up with his primary gastroenterologist in a week or 2 over my partner Dr. Randa EvensEdwards and certainly if signs of recurrent bleeding would proceed with urgent nuclear bleeding scan and if positive IR consult  [facility] Association[facility],Abubakar Crispo E  Pager [phone] After 5PM or if no answer call [phone]

## 2018-05-09 LAB — GLUCOSE, CAPILLARY
Glucose-Capillary: 103 mg/dL — ABNORMAL HIGH (ref 70–99)
Glucose-Capillary: 117 mg/dL — ABNORMAL HIGH (ref 70–99)
Glucose-Capillary: 128 mg/dL — ABNORMAL HIGH (ref 70–99)
Glucose-Capillary: 97 mg/dL (ref 70–99)

## 2018-05-09 LAB — TYPE AND SCREEN
ABO/RH(D): A POS
Antibody Screen: NEGATIVE
Unit division: 0
Unit division: 0
Unit division: 0

## 2018-05-09 LAB — BPAM RBC
Blood Product Expiration Date: 201912272359
Blood Product Expiration Date: 201912282359
Blood Product Expiration Date: 201912302359
ISSUE DATE / TIME: 201912071604
ISSUE DATE / TIME: 201912071805
ISSUE DATE / TIME: 201912091024
Unit Type and Rh: 6200
Unit Type and Rh: 6200
Unit Type and Rh: 6200

## 2018-05-09 LAB — HEMOGLOBIN AND HEMATOCRIT, BLOOD
HCT: 27.6 % — ABNORMAL LOW (ref 39.0–52.0)
Hemoglobin: 8.9 g/dL — ABNORMAL LOW (ref 13.0–17.0)

## 2018-05-09 LAB — GLUCOSE, RANDOM: Glucose, Bld: 108 mg/dL — ABNORMAL HIGH (ref 70–99)

## 2018-05-09 NOTE — Progress Notes (Signed)
Pt discharged home with wife in stable condition. Discharge instructions given. No immediate questions or concerns at this time. Pt discharged from unit via wheelchair.

## 2018-05-09 NOTE — Care Management Important Message (Signed)
Important Message  Patient Details  Name: Derrick Thornton MRN: [id number] Date of Birth: 05/28/1946   Medicare Important Message Given:  Yes    [name], [name] 05/09/2018, 11:14 AMImportant Message  Patient Details  Name: Derrick Thornton MRN: [id number] Date of Birth: 12/16/1945   Medicare Important Message Given:  Yes    [name], [name] 05/09/2018, 11:14 AM

## 2018-05-09 NOTE — Discharge Summary (Signed)
Physician Discharge Summary  Derrick Thornton MVH:[id number] DOB: 1946/03/17 DOA: 05/05/2018  PCP: Darryl Lent, PA-C  Admit date: 05/05/2018 Discharge date: 05/09/2018  Admitted From: home Disposition:  Home  Recommendations for Outpatient Follow-up:  1. Follow up with GI in 1-2 weeks 2.   Home Health:No Equipment/Devices:none  Discharge Condition:stable CODE STATUS:full  Diet recommendation: Heart Healthy    Brief/Interim Summary: 72 y.o. male past medical history of hypertension diverticulosis internal bleeding hemorrhoids who presents with bright red blood per rectum that began about 3 days prior to admission when she was transfused 1 unit of packed red blood cells in the ED  Discharge Diagnoses:  Active Problems:   Lower GI bleed   Acute blood loss anemia   HTN (hypertension)   Type 2 diabetes mellitus (HCC)   Internal hemorrhoids   Diverticulosis Lower GI bleed likely due to diverticulosis/acute blood loss anemia: There is likely a diverticular bleed, he is status post 4 units of packed red blood cells his hemoglobin after this remained stable he was treated conservatively and his globin was continuously. GI was consulted which agreed with management he will follow-up with GI as an outpatient in 2 weeks.  Diabetes mellitus type 2: With an A1c of 5.0, continue diet control at home.  Essential hypertension: No changes made to his medication.   Discharge Instructions  Discharge Instructions    Diet - low sodium heart healthy   Complete by:  As directed    Increase activity slowly   Complete by:  As directed      Allergies as of 05/09/2018   No Known Allergies     Medication List    STOP taking these medications   diclofenac 75 MG EC tablet Commonly known as:  VOLTAREN     TAKE these medications   aspirin EC 81 MG tablet Take 81 mg by mouth daily as needed (blood thinner).   fenofibrate 160 MG tablet Take 160 mg by mouth daily.   ferrous sulfate  325 (65 FE) MG tablet Take 325 mg by mouth 2 (two) times daily with a meal.   glimepiride 4 MG tablet Commonly known as:  AMARYL Take 4 mg by mouth daily as needed (for high sugar).   indomethacin 25 MG capsule Commonly known as:  INDOCIN TAKE ONE CAPSULE BY MOUTH TWICE DAILY WITH A MEAL( LIMIT TO 5 DAYS TREATMENT) What changed:  See the new instructions.   metFORMIN 500 MG tablet Commonly known as:  GLUCOPHAGE Take 500 mg by mouth daily.   OMEGA-3 PLUS 1000 MG Caps Take 2,000 mg by mouth daily.   OVER THE COUNTER MEDICATION Take 2 tablets by mouth daily. MK7 Reverse Arterial Calcification   predniSONE 50 MG tablet Commonly known as:  DELTASONE Take 1 tablet daily with food for 5 days until finished   rosuvastatin 10 MG tablet Commonly known as:  CRESTOR Take 10 mg by mouth daily.   Saw Palmetto 450 MG Caps Take 450 mg by mouth daily.   tadalafil 5 MG tablet Commonly known as:  CIALIS Take 5 mg by mouth daily as needed for erectile dysfunction.       No Known Allergies  Consultations: [facility]  Procedures/Studies:  No results found. (Echo, Carotid, EGD, Colonoscopy, ERCP)    Subjective: Complaints feels great.  Discharge Exam: Vitals:   05/08/18 2034 05/09/18 0602  BP: 128/89 135/75  Pulse: 76 69  Resp: 16 16  Temp: 98.4 F (36.9 C) 97.9 F (36.6 C)  SpO2:  97% 96%   Vitals:   05/08/18 1318 05/08/18 1400 05/08/18 2034 05/09/18 0602  BP: (!) 144/80 (!) 154/85 128/89 135/75  Pulse: 79 96 76 69  Resp: 18 20 16 16   Temp: 98.2 F (36.8 C)  98.4 F (36.9 C) 97.9 F (36.6 C)  TempSrc: Oral  Oral Oral  SpO2: 99% 99% 97% 96%  Weight:      Height:        General: Pt is alert, awake, not in acute distress Cardiovascular: RRR, S1/S2 +, no rubs, no gallops Respiratory: CTA bilaterally, no wheezing, no rhonchi Abdominal: Soft, NT, ND, bowel sounds + Extremities: no edema, no cyanosis    The results of significant diagnostics from this  hospitalization (including imaging, microbiology, ancillary and laboratory) are listed below for reference.     Microbiology: Recent Results (from the past 240 hour(s))  MRSA PCR Screening     Status: None   Collection Time: 05/05/18  9:49 PM  Result Value Ref Range Status   MRSA by PCR NEGATIVE NEGATIVE Final    Comment:        The GeneXpert MRSA Assay (FDA approved for NASAL specimens only), is one component of a comprehensive MRSA colonization surveillance program. It is not intended to diagnose MRSA infection nor to guide or monitor treatment for MRSA infections. Performed at [facility], [address] [place], [place] [address]      Labs: BNP (last 3 results) No results for input(s): BNP in the last 8760 hours. Basic Metabolic Panel: Recent Labs  Lab 05/05/18 1647 05/05/18 2244 05/06/18 0319 05/06/18 2253 05/07/18 0357 05/08/18 0333 05/09/18 0715  NA 136 139  --  140  --   --   --   K 3.6 3.8  --  3.8  --   --   --   CL 104 106  --  114*  --   --   --   CO2 24 24  --  21*  --   --   --   GLUCOSE 152* 140* 119* 105* 107* 97 108*  BUN 24* 24*  --  18  --   --   --   CREATININE 1.16 1.12  --  1.01  --   --   --   CALCIUM 8.4* 8.3*  --  7.5*  --   --   --    Liver Function Tests: Recent Labs  Lab 05/05/18 1647 05/05/18 2244  AST 27 21  ALT 16 16  ALKPHOS 37* 38  BILITOT 0.7 0.9  PROT 6.3* 5.9*  ALBUMIN 3.6 3.4*   No results for input(s): LIPASE, AMYLASE in the last 168 hours. No results for input(s): AMMONIA in the last 168 hours. CBC: Recent Labs  Lab 05/05/18 1647 05/05/18 2244  05/07/18 1857 05/08/18 0655 05/08/18 1543 05/08/18 1911 05/09/18 0715  WBC 7.1 10.9*  --   --   --   --   --   --   NEUTROABS 4.4  --   --   --   --   --   --   --   HGB 7.3* 7.8*   < > 7.8* 6.8* 9.0* 8.7* 8.9*  HCT 23.3* 24.4*   < > 23.7* 21.2* 27.9* 26.9* 27.6*  MCV 103.1* 103.4*  --   --   --   --   --   --   PLT 258 273  --   --   --   --    --   --    < > =  values in this interval not displayed.   Cardiac Enzymes: No results for input(s): CKTOTAL, CKMB, CKMBINDEX, TROPONINI in the last 168 hours. BNP: Invalid input(s): POCBNP CBG: Recent Labs  Lab 05/08/18 1811 05/09/18 0006 05/09/18 0459 05/09/18 0723 05/09/18 1125  GLUCAP 122* 97 103* 117* 128*   D-Dimer No results for input(s): DDIMER in the last 72 hours. Hgb A1c No results for input(s): HGBA1C in the last 72 hours. Lipid Profile No results for input(s): CHOL, HDL, LDLCALC, TRIG, CHOLHDL, LDLDIRECT in the last 72 hours. Thyroid function studies No results for input(s): TSH, T4TOTAL, T3FREE, THYROIDAB in the last 72 hours.  Invalid input(s): FREET3 Anemia work up No results for input(s): VITAMINB12, FOLATE, FERRITIN, TIBC, IRON, RETICCTPCT in the last 72 hours. Urinalysis No results found for: COLORURINE, APPEARANCEUR, LABSPEC, PHURINE, GLUCOSEU, HGBUR, BILIRUBINUR, KETONESUR, PROTEINUR, UROBILINOGEN, NITRITE, LEUKOCYTESUR Sepsis Labs Invalid input(s): PROCALCITONIN,  WBC,  LACTICIDVEN Microbiology Recent Results (from the past 240 hour(s))  MRSA PCR Screening     Status: None   Collection Time: 05/05/18  9:49 PM  Result Value Ref Range Status   MRSA by PCR NEGATIVE NEGATIVE Final    Comment:        The GeneXpert MRSA Assay (FDA approved for NASAL specimens only), is one component of a comprehensive MRSA colonization surveillance program. It is not intended to diagnose MRSA infection nor to guide or monitor treatment for MRSA infections. Performed at [facility], [address] [place], [place] [address]      Time coordinating discharge: 40 minutes  SIGNED:   Marinda Elk, MD  [facility] 05/09/2018, 11:59 AM Pager   If 7PM-7AM, please contact night-coverage [email or web address] Password [email or web address]

## 2018-11-08 ENCOUNTER — Ambulatory Visit (INDEPENDENT_AMBULATORY_CARE_PROVIDER_SITE_OTHER): Payer: Medicare Other

## 2018-11-08 ENCOUNTER — Ambulatory Visit (INDEPENDENT_AMBULATORY_CARE_PROVIDER_SITE_OTHER): Payer: Medicare Other | Admitting: Orthopaedic Surgery

## 2018-11-08 ENCOUNTER — Encounter: Payer: Self-pay | Admitting: Orthopaedic Surgery

## 2018-11-08 ENCOUNTER — Other Ambulatory Visit: Payer: Self-pay

## 2018-11-08 VITALS — BP 145/82 | HR 82 | Ht 69.0 in | Wt 185.0 lb

## 2018-11-08 DIAGNOSIS — M25561 Pain in right knee: Secondary | ICD-10-CM | POA: Diagnosis not present

## 2018-11-08 DIAGNOSIS — G8929 Other chronic pain: Secondary | ICD-10-CM | POA: Diagnosis not present

## 2018-11-08 DIAGNOSIS — M1711 Unilateral primary osteoarthritis, right knee: Secondary | ICD-10-CM | POA: Insufficient documentation

## 2018-11-08 MED ORDER — LIDOCAINE HCL 1 % IJ SOLN
2.0000 mL | INTRAMUSCULAR | Status: AC | PRN
  Administered 2018-11-08: 2 mL

## 2018-11-08 MED ORDER — METHYLPREDNISOLONE ACETATE 40 MG/ML IJ SUSP
80.0000 mg | INTRAMUSCULAR | Status: AC | PRN
  Administered 2018-11-08: 80 mg via INTRA_ARTICULAR

## 2018-11-08 MED ORDER — BUPIVACAINE HCL 0.5 % IJ SOLN
2.0000 mL | INTRAMUSCULAR | Status: AC | PRN
  Administered 2018-11-08: 2 mL via INTRA_ARTICULAR

## 2018-11-08 NOTE — Progress Notes (Signed)
Office Visit Note   Patient: Derrick Thornton           Date of Birth: March 31, 1946           MRN: [id number] Visit Date: 11/08/2018              Requested by: Windell Hummingbird, PA-C [address] [place], [place] [address] PCP: Windell Hummingbird, PA-C   Assessment & Plan: Visit Diagnoses:  1. Chronic pain of right knee   2. Unilateral primary osteoarthritis, right knee     Plan: Advanced osteoarthritis right knee.  Long discussion regarding diagnosis and treatment options over time.  Mr. Isenhower would like to try cortisone injection.  We will work on exercises and over-the-counter medicines  Follow-Up Instructions: Return if symptoms worsen or fail to improve.   Orders:  Orders Placed This Encounter  Procedures  . Large Joint Inj: R knee  . XR KNEE 3 VIEW RIGHT   No orders of the defined types were placed in this encounter.     Procedures: Large Joint Inj: R knee on 11/08/2018 11:51 AM Indications: pain and diagnostic evaluation Details: 25 G 1.5 in needle, anteromedial approach  Arthrogram: No  Medications: 2 mL lidocaine 1 %; 2 mL bupivacaine 0.5 %; 80 mg methylPREDNISolone acetate 40 MG/ML Procedure, treatment alternatives, risks and benefits explained, specific risks discussed. Consent was given by the patient. Immediately prior to procedure a time out was called to verify the correct patient, procedure, equipment, support staff and site/side marked as required. Patient was prepped and draped in the usual sterile fashion.       Clinical Data: No additional findings.   Subjective: Chief Complaint  Patient presents with  . Right Knee - Pain  Patient presents today for his right knee. He started walking and exercising about 2-3weeks ago. After doing weights his right knee was painful the following morning. The pain is around the patella and worse with walking.  He said that it feels unstable and grinds. He takes Diclofenac. He has a history of a right knee scope when  he was around 73years old.  Has had some recurrent and episodic pain in that knee over the years but on this occasion seen "worse".  Has had some swelling and stiffness with mostly medial joint pain.  Also has had some discomfort at the the anterior aspect of his knee with flexion extension or incline activities  HPI  Review of Systems  Constitutional: Negative for fatigue.  HENT: Negative for ear pain.   Eyes: Negative for pain.  Respiratory: Negative for shortness of breath.   Cardiovascular: Negative for leg swelling.  Gastrointestinal: Negative for constipation and diarrhea.  Endocrine: Negative for cold intolerance and heat intolerance.  Genitourinary: Negative for difficulty urinating.  Musculoskeletal: Positive for joint swelling.  Skin: Negative for rash.  Allergic/Immunologic: Negative for food allergies.  Neurological: Negative for weakness.  Hematological: Does not bruise/bleed easily.  Psychiatric/Behavioral: Negative for sleep disturbance.     Objective: Vital Signs: BP (!) 145/82   Pulse 82   Ht 5\' 9"  (1.753 m)   Wt 185 lb (83.9 kg)   BMI 27.32 kg/m   Physical Exam Constitutional:      Appearance: He is well-developed.  Eyes:     Pupils: Pupils are equal, round, and reactive to light.  Pulmonary:     Effort: Pulmonary effort is normal.  Skin:    General: Skin is warm and dry.  Neurological:     Mental  Status: He is alert and oriented to person, place, and time.  Psychiatric:        Behavior: Behavior normal.     Ortho Exam right knee with palpable osteophytes about the patellofemoral joint and medial compartment.  Lacks a few degrees to full extension.  Increased varus.  No instability.  Flexed over 110 degrees.  No calf pain.  No distal edema good sensation without burning or tingling  Specialty Comments:  No specialty comments available.  Imaging: Xr Knee 3 View Right  Result Date: 11/08/2018 Films of the right knee were obtained and 3  projections standing.  There is considerable tricompartment degenerative arthritis.  Predominant findings are noted in the medial compartment with there is a very narrow joint line with subchondral sclerosis and peripheral osteophytes.  There is about 4 degrees of varus.  There is irregularity along extent considerable patellofemoral arthritis with large osteophytes.  Possibly intra-articular osteocartilaginous loose bodies.  Patella view demonstrates patella tracked in the midline but with considerable spurring laterally    PMFS History: Patient Active Problem List   Diagnosis Date Noted  . Unilateral primary osteoarthritis, right knee 11/08/2018  . Chronic pain of right knee 11/08/2018  . Lower GI bleed 05/05/2018  . Acute blood loss anemia 05/05/2018  . HTN (hypertension) 05/05/2018  . Type 2 diabetes mellitus (HCC) 05/05/2018  . Internal hemorrhoids 05/05/2018  . Diverticulosis 05/05/2018   Past Medical History:  Diagnosis Date  . Diabetes mellitus without complication (HCC)   . Diverticulosis   . Hypertension     History reviewed. No pertinent family history.  Past Surgical History:  Procedure Laterality Date  . ACHILLES TENDON REPAIR    . KNEE ARTHROSCOPY     Social History   Occupational History  . Not on file  Tobacco Use  . Smoking status: Never Smoker  . Smokeless tobacco: Never Used  Substance and Sexual Activity  . Alcohol use: Not Currently  . Drug use: Never  . Sexual activity: Yes

## 2019-06-15 ENCOUNTER — Encounter (HOSPITAL_COMMUNITY): Payer: Self-pay | Admitting: Internal Medicine

## 2019-06-15 ENCOUNTER — Observation Stay (HOSPITAL_COMMUNITY)
Admission: EM | Admit: 2019-06-15 | Discharge: 2019-06-16 | Disposition: A | Discharge status: Home / Self Care | Payer: Medicare Other | Attending: Internal Medicine | Admitting: Internal Medicine

## 2019-06-15 ENCOUNTER — Inpatient Hospital Stay (HOSPITAL_COMMUNITY): Payer: Medicare Other

## 2019-06-15 ENCOUNTER — Other Ambulatory Visit: Payer: Self-pay

## 2019-06-15 DIAGNOSIS — Z20822 Contact with and (suspected) exposure to covid-19: Secondary | ICD-10-CM | POA: Diagnosis not present

## 2019-06-15 DIAGNOSIS — K922 Gastrointestinal hemorrhage, unspecified: Secondary | ICD-10-CM | POA: Diagnosis not present

## 2019-06-15 DIAGNOSIS — Z79899 Other long term (current) drug therapy: Secondary | ICD-10-CM | POA: Insufficient documentation

## 2019-06-15 DIAGNOSIS — I10 Essential (primary) hypertension: Secondary | ICD-10-CM | POA: Diagnosis not present

## 2019-06-15 DIAGNOSIS — E119 Type 2 diabetes mellitus without complications: Secondary | ICD-10-CM

## 2019-06-15 DIAGNOSIS — K625 Hemorrhage of anus and rectum: Secondary | ICD-10-CM | POA: Diagnosis present

## 2019-06-15 DIAGNOSIS — Z7982 Long term (current) use of aspirin: Secondary | ICD-10-CM | POA: Diagnosis not present

## 2019-06-15 HISTORY — DX: Gastrointestinal hemorrhage, unspecified: K92.2

## 2019-06-15 LAB — CBG MONITORING, ED
Glucose-Capillary: 94 mg/dL (ref 70–99)
Glucose-Capillary: 118 mg/dL — ABNORMAL HIGH (ref 70–99)

## 2019-06-15 LAB — CBC
HCT: 33.4 % — ABNORMAL LOW (ref 39.0–52.0)
HCT: 32.3 % — ABNORMAL LOW (ref 39.0–52.0)
HCT: 32.6 % — ABNORMAL LOW (ref 39.0–52.0)
HCT: 30.4 % — ABNORMAL LOW (ref 39.0–52.0)
Hemoglobin: 10.6 g/dL — ABNORMAL LOW (ref 13.0–17.0)
Hemoglobin: 10.5 g/dL — ABNORMAL LOW (ref 13.0–17.0)
Hemoglobin: 10.6 g/dL — ABNORMAL LOW (ref 13.0–17.0)
Hemoglobin: 10.1 g/dL — ABNORMAL LOW (ref 13.0–17.0)
MCH: 32.1 pg (ref 26.0–34.0)
MCH: 33.2 pg (ref 26.0–34.0)
MCH: 32.7 pg (ref 26.0–34.0)
MCH: 33 pg (ref 26.0–34.0)
MCHC: 31.7 g/dL (ref 30.0–36.0)
MCHC: 32.5 g/dL (ref 30.0–36.0)
MCHC: 32.5 g/dL (ref 30.0–36.0)
MCHC: 33.2 g/dL (ref 30.0–36.0)
MCV: 101.2 fL — ABNORMAL HIGH (ref 80.0–100.0)
MCV: 102.2 fL — ABNORMAL HIGH (ref 80.0–100.0)
MCV: 100.6 fL — ABNORMAL HIGH (ref 80.0–100.0)
MCV: 99.3 fL (ref 80.0–100.0)
Platelets: 306 10*3/uL (ref 150–400)
Platelets: 293 10*3/uL (ref 150–400)
Platelets: 279 10*3/uL (ref 150–400)
Platelets: 277 10*3/uL (ref 150–400)
RBC: 3.3 MIL/uL — ABNORMAL LOW (ref 4.22–5.81)
RBC: 3.16 MIL/uL — ABNORMAL LOW (ref 4.22–5.81)
RBC: 3.24 MIL/uL — ABNORMAL LOW (ref 4.22–5.81)
RBC: 3.06 MIL/uL — ABNORMAL LOW (ref 4.22–5.81)
RDW: 11.9 % (ref 11.5–15.5)
RDW: 11.8 % (ref 11.5–15.5)
RDW: 11.8 % (ref 11.5–15.5)
RDW: 11.9 % (ref 11.5–15.5)
WBC: 5.2 10*3/uL (ref 4.0–10.5)
WBC: 5.2 10*3/uL (ref 4.0–10.5)
WBC: 5.3 10*3/uL (ref 4.0–10.5)
WBC: 5.4 10*3/uL (ref 4.0–10.5)
nRBC: 0 % (ref 0.0–0.2)
nRBC: 0 % (ref 0.0–0.2)
nRBC: 0 % (ref 0.0–0.2)
nRBC: 0 % (ref 0.0–0.2)

## 2019-06-15 LAB — CBC WITH DIFFERENTIAL/PLATELET
Abs Immature Granulocytes: 0.02 10*3/uL (ref 0.00–0.07)
Basophils Absolute: 0 10*3/uL (ref 0.0–0.1)
Basophils Relative: 1 %
Eosinophils Absolute: 0.1 10*3/uL (ref 0.0–0.5)
Eosinophils Relative: 2 %
HCT: 37.7 % — ABNORMAL LOW (ref 39.0–52.0)
Hemoglobin: 12.1 g/dL — ABNORMAL LOW (ref 13.0–17.0)
Immature Granulocytes: 0 %
Lymphocytes Relative: 30 %
Lymphs Abs: 1.8 10*3/uL (ref 0.7–4.0)
MCH: 32.6 pg (ref 26.0–34.0)
MCHC: 32.1 g/dL (ref 30.0–36.0)
MCV: 101.6 fL — ABNORMAL HIGH (ref 80.0–100.0)
Monocytes Absolute: 0.6 10*3/uL (ref 0.1–1.0)
Monocytes Relative: 9 %
Neutro Abs: 3.5 10*3/uL (ref 1.7–7.7)
Neutrophils Relative %: 58 %
Platelets: 345 10*3/uL (ref 150–400)
RBC: 3.71 MIL/uL — ABNORMAL LOW (ref 4.22–5.81)
RDW: 11.8 % (ref 11.5–15.5)
WBC: 6 10*3/uL (ref 4.0–10.5)
nRBC: 0 % (ref 0.0–0.2)

## 2019-06-15 LAB — RESPIRATORY PANEL BY RT PCR (FLU A&B, COVID)
Influenza A by PCR: NEGATIVE
Influenza B by PCR: NEGATIVE
SARS Coronavirus 2 by RT PCR: NEGATIVE

## 2019-06-15 LAB — BASIC METABOLIC PANEL
Anion gap: 8 (ref 5–15)
BUN: 16 mg/dL (ref 8–23)
CO2: 26 mmol/L (ref 22–32)
Calcium: 9.3 mg/dL (ref 8.9–10.3)
Chloride: 104 mmol/L (ref 98–111)
Creatinine, Ser: 1.33 mg/dL — ABNORMAL HIGH (ref 0.61–1.24)
GFR calc Af Amer: >60 mL/min (ref >60)
GFR calc non Af Amer: 53 mL/min — ABNORMAL LOW (ref >60)
Glucose, Bld: 122 mg/dL — ABNORMAL HIGH (ref 70–99)
Potassium: 4.1 mmol/L (ref 3.5–5.1)
Sodium: 138 mmol/L (ref 135–145)

## 2019-06-15 LAB — TYPE AND SCREEN
ABO/RH(D): A POS
Antibody Screen: NEGATIVE

## 2019-06-15 LAB — ABO/RH: ABO/RH(D): A POS

## 2019-06-15 LAB — GLUCOSE, CAPILLARY: Glucose-Capillary: 166 mg/dL — ABNORMAL HIGH (ref 70–99)

## 2019-06-15 MED ORDER — SODIUM CHLORIDE 0.9 % IV BOLUS
500.0000 mL | Freq: Once | INTRAVENOUS | Status: AC
  Administered 2019-06-15: 500 mL via INTRAVENOUS

## 2019-06-15 MED ORDER — INSULIN ASPART 100 UNIT/ML ~~LOC~~ SOLN: 0.0000 [IU] | SUBCUTANEOUS | Status: DC

## 2019-06-15 MED ORDER — ACETAMINOPHEN 650 MG RE SUPP: 650.0000 mg | Freq: Four times a day (QID) | RECTAL | Status: DC | PRN

## 2019-06-15 MED ORDER — ONDANSETRON HCL 4 MG/2ML IJ SOLN: 4.0000 mg | Freq: Four times a day (QID) | INTRAMUSCULAR | Status: DC | PRN

## 2019-06-15 MED ORDER — CIPROFLOXACIN IN D5W 400 MG/200ML IV SOLN
400.0000 mg | Freq: Two times a day (BID) | INTRAVENOUS | Status: DC
  Administered 2019-06-15: 400 mg via INTRAVENOUS
  Filled 2019-06-15: qty 200

## 2019-06-15 MED ORDER — ONDANSETRON HCL 4 MG PO TABS: 4.0000 mg | ORAL_TABLET | Freq: Four times a day (QID) | ORAL | Status: DC | PRN

## 2019-06-15 MED ORDER — METRONIDAZOLE IN NACL 5-0.79 MG/ML-% IV SOLN
500.0000 mg | Freq: Three times a day (TID) | INTRAVENOUS | Status: DC
  Administered 2019-06-15: 500 mg via INTRAVENOUS
  Filled 2019-06-15: qty 100

## 2019-06-15 MED ORDER — TAMSULOSIN HCL 0.4 MG PO CAPS
0.4000 mg | ORAL_CAPSULE | Freq: Every day | ORAL | Status: DC
  Administered 2019-06-15 – 2019-06-16 (×2): 0.4 mg via ORAL
  Filled 2019-06-15 (×2): qty 1

## 2019-06-15 MED ORDER — PANTOPRAZOLE SODIUM 40 MG PO TBEC
40.0000 mg | DELAYED_RELEASE_TABLET | Freq: Every day | ORAL | Status: DC
  Administered 2019-06-15 – 2019-06-16 (×2): 40 mg via ORAL
  Filled 2019-06-15 (×2): qty 1

## 2019-06-15 MED ORDER — GLIMEPIRIDE 1 MG PO TABS
2.0000 mg | ORAL_TABLET | Freq: Every day | ORAL | Status: DC
  Administered 2019-06-16: 2 mg via ORAL
  Filled 2019-06-15: qty 2

## 2019-06-15 MED ORDER — SODIUM CHLORIDE 0.9 % IV SOLN: INTRAVENOUS | Status: DC

## 2019-06-15 MED ORDER — ACETAMINOPHEN 325 MG PO TABS: 650.0000 mg | ORAL_TABLET | Freq: Four times a day (QID) | ORAL | Status: DC | PRN

## 2019-06-15 MED ORDER — HYDRALAZINE HCL 20 MG/ML IJ SOLN: 10.0000 mg | INTRAMUSCULAR | Status: DC | PRN

## 2019-06-15 MED ORDER — IOHEXOL 350 MG/ML SOLN
100.0000 mL | Freq: Once | INTRAVENOUS | Status: AC | PRN
  Administered 2019-06-15: 100 mL via INTRAVENOUS

## 2019-06-15 MED ORDER — CHOLECALCIFEROL 10 MCG (400 UNIT) PO TABS
400.0000 [IU] | ORAL_TABLET | Freq: Every day | ORAL | Status: DC | PRN
  Filled 2019-06-15: qty 1

## 2019-06-15 NOTE — ED Triage Notes (Signed)
Pt states he has rectal bleeding that began today. Pt reports two bowel movements that were black. Pt denies pain, SOB.

## 2019-06-15 NOTE — Consult Note (Signed)
Consultation  Referring Provider: Pokrel MD/TRH Primary Care Physician:  Darryl Lent, PA-C Primary Gastroenterologist:  none  Reason for Consultation:  GI bleed  HPI: Derrick Thornton is a 74 y.o. male, who presented to the emergency room early this morning after onset of acute rectal bleeding yesterday afternoon at about 4 PM.  Patient states that he has previous history of diverticular bleeding, and was last hospitalized a little over a year ago with a diverticular bleed.  He says he had 2 previous bleeds prior to that but was not hospitalized. He previously was established with [facility]/Dr. Noe Gens and by records last had colonoscopy between 2 and 3 years ago through [facility].  Prior consult notes from last year state no polyps. Patient says she did undergo hemorrhoidal banding procedures through that practice as well.  He is not on any regular aspirin NSAIDs or blood thinners.  He says yesterday afternoon he had onset of mild left lower quadrant discomfort followed by several episodes of grossly bloody stools.  He did have further episodes after arrival to the emergency room but has not had any bleeding since about 4 AM.. Hemoglobin was 12.1 on arrival, down to 10.6 this morning.  BUN 16/creatinine 1.3. Patient has been hemodynamically stable. He did undergo CT angiography this morning which showed descending and sigmoid colonic diverticulosis with multiple large diverticuli, no active extravasation or hemorrhage noted, moderate stool burden.  Nonobstructing left lower pole nephrolithiasis.   Past Medical History:  Diagnosis Date  . Diabetes mellitus without complication (HCC)   . Diverticulosis   . Hypertension     Past Surgical History:  Procedure Laterality Date  . ACHILLES TENDON REPAIR    . KNEE ARTHROSCOPY      Prior to Admission medications   Medication Sig Start Date End Date Taking? Authorizing Provider  fenofibrate 160 MG tablet Take 160 mg by mouth daily.     Yes [provider]  ferrous sulfate 325 (65 FE) MG tablet Take 325 mg by mouth daily as needed (anemia).  10/04/17  Yes [provider]  Garlic 1000 MG CAPS Take 2,000 mg by mouth daily.   Yes [provider]  glimepiride (AMARYL) 4 MG tablet Take 2 mg by mouth daily as needed (for high sugar).    Yes [provider]  Misc Natural Products (PROSTATE HEALTH PO) Take 2 tablets by mouth daily.   Yes [provider]  omeprazole (PRILOSEC) 40 MG capsule Take 40 mg by mouth daily as needed (heart burn).   Yes [provider]  OVER THE COUNTER MEDICATION Take 1 tablet by mouth daily. Zembright Mood Plus   Yes [provider]  OVER THE COUNTER MEDICATION Take 1 capsule by mouth daily. BEET POWDER   Yes [provider]  rosuvastatin (CRESTOR) 10 MG tablet Take 10 mg by mouth daily.   Yes [provider]  tadalafil (CIALIS) 5 MG tablet Take 5 mg by mouth daily as needed for erectile dysfunction.  08/20/15  Yes [provider]  tamsulosin (FLOMAX) 0.4 MG CAPS capsule Take 0.4 mg by mouth daily.   Yes [provider]  Vitamin D, Cholecalciferol, 10 MCG (400 UNIT) CAPS Take 400 Units by mouth daily as needed (pt prefer).   Yes [provider]  losartan (COZAAR) 50 MG tablet Take 50 mg by mouth daily. Pt was supposed to start 1/15, but has not taken 1st dose    [provider]    Current  Facility-Administered Medications  Medication Dose Route Frequency Provider Last Rate Last Admin  . 0.9 %  sodium chloride infusion   Intravenous Continuous Eduard Clos, MD 100 mL/hr at 06/15/19 0744 New Bag at 06/15/19 0744  . acetaminophen (TYLENOL) tablet 650 mg  650 mg Oral Q6H PRN Eduard Clos, MD       Or  . acetaminophen (TYLENOL) suppository 650 mg  650 mg Rectal Q6H PRN Eduard Clos, MD      . ciprofloxacin (CIPRO) IVPB 400 mg  400 mg Intravenous Q12H Rumbarger, Rachel L, RPH  200 mL/hr at 06/15/19 1107 400 mg at 06/15/19 1107  . hydrALAZINE (APRESOLINE) injection 10 mg  10 mg Intravenous Q4H PRN Eduard Clos, MD      . insulin aspart (novoLOG) injection 0-9 Units  0-9 Units Subcutaneous Q4H Eduard Clos, MD      . metroNIDAZOLE (FLAGYL) IVPB 500 mg  500 mg Intravenous Q8H Pokhrel, Laxman, MD 100 mL/hr at 06/15/19 1103 500 mg at 06/15/19 1103  . ondansetron (ZOFRAN) tablet 4 mg  4 mg Oral Q6H PRN Eduard Clos, MD       Or  . ondansetron [facility]) injection 4 mg  4 mg Intravenous Q6H PRN Eduard Clos, MD       Current Outpatient Medications  Medication Sig Dispense Refill  . fenofibrate 160 MG tablet Take 160 mg by mouth daily.     . ferrous sulfate 325 (65 FE) MG tablet Take 325 mg by mouth daily as needed (anemia).     . Garlic 1000 MG CAPS Take 2,000 mg by mouth daily.    Marland Kitchen glimepiride (AMARYL) 4 MG tablet Take 2 mg by mouth daily as needed (for high sugar).     . Misc Natural Products (PROSTATE HEALTH PO) Take 2 tablets by mouth daily.    Marland Kitchen omeprazole (PRILOSEC) 40 MG capsule Take 40 mg by mouth daily as needed (heart burn).    Marland Kitchen OVER THE COUNTER MEDICATION Take 1 tablet by mouth daily. Zembright Mood Plus    . OVER THE COUNTER MEDICATION Take 1 capsule by mouth daily. BEET POWDER    . rosuvastatin (CRESTOR) 10 MG tablet Take 10 mg by mouth daily.    . tadalafil (CIALIS) 5 MG tablet Take 5 mg by mouth daily as needed for erectile dysfunction.     . tamsulosin (FLOMAX) 0.4 MG CAPS capsule Take 0.4 mg by mouth daily.    . Vitamin D, Cholecalciferol, 10 MCG (400 UNIT) CAPS Take 400 Units by mouth daily as needed (pt prefer).    . losartan (COZAAR) 50 MG tablet Take 50 mg by mouth daily. Pt was supposed to start 1/15, but has not taken 1st dose      Allergies as of 06/15/2019  . (No Known Allergies)    Family History  Problem Relation Age of Onset  . Stroke Father   . Colon cancer Neg Hx     Social History   Socioeconomic  History  . Marital status: Married    Spouse name: Not on file  . Number of children: Not on file  . Years of education: Not on file  . Highest education level: Not on file  Occupational History  . Not on file  Tobacco Use  . Smoking status: Never Smoker  . Smokeless tobacco: Never Used  Substance and Sexual Activity  . Alcohol use: Not Currently  . Drug use: Never  . Sexual activity: Yes  Other  Topics Concern  . Not on file  Social History Narrative  . Not on file   Social Determinants of Health   Financial Resource Strain:   . Difficulty of Paying Living Expenses: Not on file  Food Insecurity:   . Worried About Programme researcher, broadcasting/film/video in the Last Year: Not on file  . Ran Out of Food in the Last Year: Not on file  Transportation Needs:   . Lack of Transportation (Medical): Not on file  . Lack of Transportation (Non-Medical): Not on file  Physical Activity:   . Days of Exercise per Week: Not on file  . Minutes of Exercise per Session: Not on file  Stress:   . Feeling of Stress : Not on file  Social Connections:   . Frequency of Communication with Friends and Family: Not on file  . Frequency of Social Gatherings with Friends and Family: Not on file  . Attends Religious Services: Not on file  . Active Member of Clubs or Organizations: Not on file  . Attends Banker Meetings: Not on file  . Marital Status: Not on file  Intimate Partner Violence:   . Fear of Current or Ex-Partner: Not on file  . Emotionally Abused: Not on file  . Physically Abused: Not on file  . Sexually Abused: Not on file    Review of Systems: Pertinent positive and negative review of systems were noted in the above HPI section.  All other review of systems was otherwise negative.  Physical Exam: Vital signs in last 24 hours: Temp:  [97.4 F (36.3 C)-98.4 F (36.9 C)] 97.4 F (36.3 C) (01/15 0614) Pulse Rate:  [70-106] 74 (01/15 1103) Resp:  [11-16] 14 (01/15 1103) BP:  (106-139)/(71-96) 127/84 (01/15 1103) SpO2:  [94 %-98 %] 96 % (01/15 1103) Weight:  [81.6 kg] 81.6 kg (01/15 0316)   General:   Alert,  Well-developed, well-nourished, older African-American male pleasant and cooperative in NAD Head:  Normocephalic and atraumatic. Eyes:  Sclera clear, no icterus.   Conjunctiva pink. Ears:  Normal auditory acuity. Nose:  No deformity, discharge,  or lesions. Mouth:  No deformity or lesions.   Neck:  Supple; no masses or thyromegaly. Lungs:  Clear throughout to auscultation.   No wheezes, crackles, or rhonchi. Heart:  Regular rate and rhythm; no murmurs, clicks, rubs,  or gallops. Abdomen:  Soft, minimally tender left lower quadrant,, BS active,nonpalp mass or hsm.   Rectal:  Deferred  Msk:  Symmetrical without gross deformities. . Pulses:  Normal pulses noted. Extremities:  Without clubbing or edema. Neurologic:  Alert and  oriented x4;  grossly normal neurologically. Skin:  Intact without significant lesions or rashes.. Psych:  Alert and cooperative. Normal mood and affect.  Intake/Output from previous day: No intake/output data recorded. Intake/Output this shift: No intake/output data recorded.  Lab Results: Recent Labs    06/15/19 0323 06/15/19 0640  WBC 6.0 5.2  HGB 12.1* 10.6*  HCT 37.7* 33.4*  PLT 345 306   BMET Recent Labs    06/15/19 0323  NA 138  K 4.1  CL 104  CO2 26  GLUCOSE 122*  BUN 16  CREATININE 1.33*  CALCIUM 9.3   LFT No results for input(s): PROT, ALBUMIN, AST, ALT, ALKPHOS, BILITOT, BILIDIR, IBILI in the last 72 hours. PT/INR No results for input(s): LABPROT, INR in the last 72 hours. Hepatitis Panel No results for input(s): HEPBSAG, HCVAB, HEPAIGM, HEPBIGM in the last 72 hours.    IMPRESSION:  #1  74 year old African-American male with acute lower GI hemorrhage.  Bleeding is very consistent with diverticular hemorrhage, and patient has prior history. CT angio this morning showed no evidence of any active  bleeding and significant descending and sigmoid colonic Diverticulosis  #2 anemia secondary to acute blood loss #3 hypertension #4 adult onset diabetes mellitus  Plan; Full liquid diet today Serial hemoglobins every 6 hours, transfuse for hemoglobin 7 or less Do not plan colonoscopy during this admission.  Patient had colonoscopy 2 to 3 years ago through [facility] and has no history of polyps to his knowledge. We will need to obtain copy of the report Patient does not wish to follow-up with [facility], happy to follow him as an outpatient, and can be established with Dr. Hilarie Fredrickson If patient has further if again active hemorrhage consider repeat CT angio with embolization   Gari Hartsell PA-C 06/15/2019, 11:34 AM

## 2019-06-15 NOTE — Progress Notes (Addendum)
Same day note  Patient seen and examined at bedside.  Patient was admitted to the hospital for rectal bleeding  At the time of my evaluation, patient complains of one episode of bright red bleeding this morning with mild left lower quadrant pain.  Physical examination reveals mild left lower quadrant tenderness  Laboratory data and imaging was reviewed over the last 24 hours  Assessment and Plan.  Acute GI bleeding likely lower GI given the history of diverticulosis.   CT angiogram of the abdomen was performed which did not show any  evidence of active bleeding but diverticulitis of the descending and sigmoid colon and diverticula as well.  Continue n.p.o, follow CBC. consult GI.  Texted [facility] for consultation.  Patient is hemodynamically stable.  Hemoglobin of 10.6 from 12.1.  Diabetes mellitus type 2.  Currently n.p.o. continue sliding-scale insulin.  Closely monitor glucose levels   Anemia.  Secondary to acute blood loss.  Follow serial CBCs.  Transfuse for hemoglobin less than 7 or if symptomatic  Essential hypertension - as needed IV hydralazine since patient is n.p.o.  hyperlipidemia presently n.p.o. resume p.o. medications after p.o. is okay  Addendum:  06/15/2019 10:32 AM  Will add Cipro and Metro due to findings of diverticulitis.  Will await GI opinion.  No Charge  Signed,  Tenny Craw, MD [facility]

## 2019-06-15 NOTE — Progress Notes (Signed)
Pharmacy Antibiotic Note  Derrick Thornton is a 74 y.o. male admitted on 06/15/2019 with rectal bleeding.  Pharmacy has been consulted for ciprofloxacin dosing. Pt is afebrile and WBC is WNL. Scr is slightly elevated at 1.33.   Plan: Ciprofloxacin 400mg  IV Q12H F/u renal fxn, C&S, clinical status Narrow as able *Pharmacy will sign off as no further dose adjustments are anticipated.   Height: 5\' 9"  (175.3 cm) Weight: 180 lb (81.6 kg) IBW/kg (Calculated) : 70.7  Temp (24hrs), Avg:98 F (36.7 C), Min:97.4 F (36.3 C), Max:98.4 F (36.9 C)  Recent Labs  Lab 06/15/19 0323 06/15/19 0640  WBC 6.0 5.2  CREATININE 1.33*  --     Estimated Creatinine Clearance: 50.9 mL/min (A) (by C-G formula based on SCr of 1.33 mg/dL (H)).    No Known Allergies  Thank you for allowing pharmacy to be a part of this patient's care.  Karisma Meiser, 06/17/19 06/15/2019 10:37 AM

## 2019-06-15 NOTE — H&P (Signed)
History and Physical    Derrick Thornton AOZ:[id number] DOB: [birth year] DOA: 06/15/2019  PCP: Windell Hummingbird, PA-C  Patient coming from: Home.  Chief Complaint: Rectal bleeding.  HPI: Derrick Thornton is a 74 y.o. male with history of diabetes mellitus type 2, hyperlipidemia, hypertension with previous history of diverticular bleed last colonoscopy per the patient was 4 years ago has had procedure for hemorrhoids follows with [facility] presents to the ER because of rectal bleeding.  Patient started having rectal bleeding around 4 PM yesterday had at least 3 episodes with clots before coming to the ER.  Over the last 3 days patient has been having some left lower quadrant abdominal pain with no fever chills or vomiting or diarrhea.  Denies chest pain or shortness of breath.  Denies taking aspirin or NSAIDs or any blood thinner.  ED Course: In the ER patient was mildly tachycardic but blood pressure was stable afebrile not hypoxic labs show hemoglobin of around 12.1 with macrocytic picture.  Creatinine 1.3.  Had another episode of rectal bleeding in the ER.  Patient admitted for further management of acute GI bleeding likely lower GI.  Review of Systems: As per HPI, rest all negative.   Past Medical History:  Diagnosis Date  . Diabetes mellitus without complication ([place])   . Diverticulosis   . Hypertension     Past Surgical History:  Procedure Laterality Date  . ACHILLES TENDON REPAIR    . KNEE ARTHROSCOPY       reports that he has never smoked. He has never used smokeless tobacco. He reports previous alcohol use. He reports that he does not use drugs.  No Known Allergies  Family History  Problem Relation Age of Onset  . Stroke Father   . Colon cancer Neg Hx     Prior to Admission medications   Medication Sig Start Date End Date Taking? Authorizing Provider  fenofibrate 160 MG tablet Take 160 mg by mouth daily.    Yes [provider]  ferrous sulfate 325  (65 FE) MG tablet Take 325 mg by mouth daily as needed (anemia).  10/04/17  Yes [provider]  glimepiride (AMARYL) 4 MG tablet Take 4 mg by mouth daily as needed (for high sugar).     [provider]  rosuvastatin (CRESTOR) 10 MG tablet Take 10 mg by mouth daily.    [provider]  tadalafil (CIALIS) 5 MG tablet Take 5 mg by mouth daily as needed for erectile dysfunction.  08/20/15   [provider]    Physical Exam: Constitutional: Moderately built and nourished. Vitals:   06/15/19 0545 06/15/19 0600 06/15/19 0614 06/15/19 0615  BP: 130/78 113/80 139/81 139/81  Pulse: 73 70 75 73  Resp:   13 13  Temp:   (!) 97.4 F (36.3 C)   TempSrc:   Oral   SpO2: 96% 95% 96% 96%  Weight:      Height:       Eyes: Anicteric no pallor. ENMT: No discharge from the ears eyes nose or mouth. Neck: No mass felt.  No neck rigidity. Respiratory: No rhonchi or crepitations. Cardiovascular: S1-S2 heard. Abdomen: Soft mild tenderness in the left lower quadrant no guarding or rigidity. Musculoskeletal: No edema. Skin: No rash. Neurologic: Alert awake oriented time place and person.  Moves all extremities. Psychiatric: Appears normal per normal affect.   Labs on Admission: I have personally reviewed following labs and imaging studies  CBC: Recent Labs  Lab 06/15/19  0323  WBC 6.0  NEUTROABS 3.5  HGB 12.1*  HCT 37.7*  MCV 101.6*  PLT 345   Basic Metabolic Panel: Recent Labs  Lab 06/15/19 0323  NA 138  K 4.1  CL 104  CO2 26  GLUCOSE 122*  BUN 16  CREATININE 1.33*  CALCIUM 9.3   GFR: Estimated Creatinine Clearance: 50.9 mL/min (A) (by C-G formula based on SCr of 1.33 mg/dL (H)). Liver Function Tests: No results for input(s): AST, ALT, ALKPHOS, BILITOT, PROT, ALBUMIN in the last 168 hours. No results for input(s): LIPASE, AMYLASE in the last 168 hours. No results for input(s): AMMONIA in the last 168 hours. Coagulation Profile: No results for  input(s): INR, PROTIME in the last 168 hours. Cardiac Enzymes: No results for input(s): CKTOTAL, CKMB, CKMBINDEX, TROPONINI in the last 168 hours. BNP (last 3 results) No results for input(s): PROBNP in the last 8760 hours. HbA1C: No results for input(s): HGBA1C in the last 72 hours. CBG: No results for input(s): GLUCAP in the last 168 hours. Lipid Profile: No results for input(s): CHOL, HDL, LDLCALC, TRIG, CHOLHDL, LDLDIRECT in the last 72 hours. Thyroid Function Tests: No results for input(s): TSH, T4TOTAL, FREET4, T3FREE, THYROIDAB in the last 72 hours. Anemia Panel: No results for input(s): VITAMINB12, FOLATE, FERRITIN, TIBC, IRON, RETICCTPCT in the last 72 hours. Urine analysis: No results found for: COLORURINE, APPEARANCEUR, LABSPEC, PHURINE, GLUCOSEU, HGBUR, BILIRUBINUR, KETONESUR, PROTEINUR, UROBILINOGEN, NITRITE, LEUKOCYTESUR Sepsis Labs: @LABRCNTIP (procalcitonin:4,lacticidven:4) )No results found for this or any previous visit (from the past 240 hour(s)).   Radiological Exams on Admission: No results found.  EKG: Independently reviewed.  Normal sinus rhythm.  Assessment/Plan Principal Problem:   Acute GI bleeding Active Problems:   HTN (hypertension)   Type 2 diabetes mellitus (HCC)    1. Acute GI bleeding likely lower GI given history of diverticulosis.  Also has some left lower quadrant tenderness and pain for which I have ordered CT abdomen.  We will keep patient n.p.o. check serial CBC consult GI.  Presently hemodynamically stable. 2. Diabetes mellitus type 2 we will keep patient on sliding scale coverage. 3. Anemia follow serial CBCs. 4. Hypertension we will keep patient on as needed IV hydralazine since patient is n.p.o. 5. History of hyperlipidemia presently n.p.o.  Given the ongoing bleeding with patient initially being tachycardic with abdominal pain will need close monitoring for any further worsening and will need inpatient status.   DVT prophylaxis:  SCDs due to ongoing bleeding. Code Status: Full code. Family Communication: Discussed with patient. Disposition Plan: Home. Consults called: None. Admission status: Inpatient.   MD [facility] Pager [phone].  If 7PM-7AM, please contact night-coverage [email or web address] Password [facility]  06/15/2019, 6:38 AM

## 2019-06-15 NOTE — ED Provider Notes (Addendum)
[facility] Provider Note   CSN: [id number] Arrival date & time: 06/15/19  0258     History Chief Complaint  Patient presents with  . Rectal Bleeding    Derrick Thornton is a 74 y.o. male.  Patient presents to the emergency department for evaluation of rectal bleeding.  Patient reports that he has had 2 episodes of passage of blood today.  The first time it was mixed with stool, the second time it was pure blood and clots.  Patient denies any abdominal pain.  He has not had any chest pain shortness of breath or heart palpitations.        Past Medical History:  Diagnosis Date  . Diabetes mellitus without complication ([place])   . Diverticulosis   . Hypertension     Patient Active Problem List   Diagnosis Date Noted  . Unilateral primary osteoarthritis, right knee 11/08/2018  . Chronic pain of right knee 11/08/2018  . Lower GI bleed 05/05/2018  . Acute blood loss anemia 05/05/2018  . HTN (hypertension) 05/05/2018  . Type 2 diabetes mellitus ([place]) 05/05/2018  . Internal hemorrhoids 05/05/2018  . Diverticulosis 05/05/2018    Past Surgical History:  Procedure Laterality Date  . ACHILLES TENDON REPAIR    . KNEE ARTHROSCOPY         No family history on file.  Social History   Tobacco Use  . Smoking status: Never Smoker  . Smokeless tobacco: Never Used  Substance Use Topics  . Alcohol use: Not Currently  . Drug use: Never    Home Medications Prior to Admission medications   Medication Sig Start Date End Date Taking? Authorizing Provider  aspirin EC 81 MG tablet Take 81 mg by mouth daily as needed (blood thinner).     [provider]  diclofenac (VOLTAREN) 50 MG EC tablet Take 50 mg by mouth 2 (two) times daily as needed. 10/24/18   [provider]  fenofibrate 160 MG tablet Take 160 mg by mouth daily.     [provider]  ferrous sulfate 325 (65 FE) MG tablet Take 325 mg by mouth 2 (two) times  daily with a meal.  10/04/17   [provider]  glimepiride (AMARYL) 4 MG tablet Take 4 mg by mouth daily as needed (for high sugar).     [provider]  indomethacin (INDOCIN) 25 MG capsule TAKE ONE CAPSULE BY MOUTH TWICE DAILY WITH A MEAL( LIMIT TO 5 DAYS TREATMENT) Patient taking differently: Take 25 mg by mouth 3 (three) times daily as needed for mild pain.  04/06/18   Magnus Sinning, MD  metFORMIN (GLUCOPHAGE) 500 MG tablet Take 500 mg by mouth daily.     [provider]  Omega-3 Fatty Acids (OMEGA-3 PLUS) 1000 MG CAPS Take 2,000 mg by mouth daily.    [provider]  omeprazole (PRILOSEC) 40 MG capsule  11/04/18   [provider]  OVER THE COUNTER MEDICATION Take 2 tablets by mouth daily. MK7 Reverse Arterial Calcification    [provider]  predniSONE (DELTASONE) 50 MG tablet Take 1 tablet daily with food for 5 days until finished 01/26/18   Magnus Sinning, MD  rosuvastatin (CRESTOR) 10 MG tablet Take 10 mg by mouth daily.    [provider]  Saw Palmetto 450 MG CAPS Take 450 mg by mouth daily.    [provider]  tadalafil (CIALIS) 5 MG tablet Take 5 mg by mouth daily as needed for erectile  dysfunction.  08/20/15   [provider]    Allergies    Patient has no known allergies.  Review of Systems   Review of Systems  Respiratory: Negative for shortness of breath.   Cardiovascular: Negative for chest pain and palpitations.  Gastrointestinal: Positive for anal bleeding and blood in stool.  All other systems reviewed and are negative.   Physical Exam Updated Vital Signs BP 127/88   Pulse 79   Temp 98.3 F (36.8 C) (Oral)   Resp 12   Ht 5\' 9"  (1.753 m)   Wt 81.6 kg   SpO2 96%   BMI 26.58 kg/m   Physical Exam Vitals and nursing note reviewed.  Constitutional:      General: He is not in acute distress.    Appearance: Normal appearance. He is well-developed.  HENT:     Head: Normocephalic and  atraumatic.     Right Ear: Hearing normal.     Left Ear: Hearing normal.     Nose: Nose normal.  Eyes:     Conjunctiva/sclera: Conjunctivae normal.     Pupils: Pupils are equal, round, and reactive to light.  Cardiovascular:     Rate and Rhythm: Regular rhythm. Tachycardia present.     Heart sounds: S1 normal and S2 normal. No murmur. No friction rub. No gallop.   Pulmonary:     Effort: Pulmonary effort is normal. No respiratory distress.     Breath sounds: Normal breath sounds.  Chest:     Chest wall: No tenderness.  Abdominal:     General: Bowel sounds are normal.     Palpations: Abdomen is soft.     Tenderness: There is no abdominal tenderness. There is no guarding or rebound. Negative signs include Murphy's sign and McBurney's sign.     Hernia: No hernia is present.  Musculoskeletal:        General: Normal range of motion.     Cervical back: Normal range of motion and neck supple.  Skin:    General: Skin is warm and dry.     Findings: No rash.  Neurological:     Mental Status: He is alert and oriented to person, place, and time.     GCS: GCS eye subscore is 4. GCS verbal subscore is 5. GCS motor subscore is 6.     Cranial Nerves: No cranial nerve deficit.     Sensory: No sensory deficit.     Coordination: Coordination normal.  Psychiatric:        Speech: Speech normal.        Behavior: Behavior normal.        Thought Content: Thought content normal.     ED Results / Procedures / Treatments   Labs (all labs ordered are listed, but only abnormal results are displayed) Labs Reviewed  CBC WITH DIFFERENTIAL/PLATELET - Abnormal; Notable for the following components:      Result Value   RBC 3.71 (*)    Hemoglobin 12.1 (*)    HCT 37.7 (*)    MCV 101.6 (*)    All other components within normal limits  BASIC METABOLIC PANEL - Abnormal; Notable for the following components:   Glucose, Bld 122 (*)    Creatinine, Ser 1.33 (*)    GFR calc non Af Amer 53 (*)    All other  components within normal limits  TYPE AND SCREEN  ABO/RH    EKG None ED ECG REPORT   Date: 06/15/2019  Rate: 93  Rhythm: normal  sinus rhythm  QRS Axis: normal  Intervals: normal  ST/T Wave abnormalities: normal  Conduction Disutrbances:none  Narrative Interpretation:   Old EKG Reviewed: unchanged  I have personally reviewed the EKG tracing and agree with the computerized printout as noted.  Radiology No results found.  Procedures Procedures (including critical care time)  Medications Ordered in ED Medications  sodium chloride 0.9 % bolus 500 mL (0 mLs Intravenous Stopped 06/15/19 0407)    ED Course  I have reviewed the triage vital signs and the nursing notes.  Pertinent labs & imaging results that were available during my care of the patient were reviewed by me and considered in my medical decision making (see chart for details).    MDM Rules/Calculators/A&P                      Patient presents to the emergency department for evaluation of rectal bleeding.  Patient has had this happen before.  Reviewing his records reveals that he has had previous colonoscopies that have showed severe diverticular disease and his bleeding has been secondary to diverticulosis in the past.  He has been lost to follow-up, however.  He was supposedly establishing care at [facility] but no showed multiple visits and has not been seen by any GI doctor since February of last year.  At that time they were planning to repeat his colonoscopy but it was never done because he did not follow-up.  Patient has had multiple episodes of dark and maroon blood and clots both prior to arrival in the ER and while here in the ER.  He is mildly tachycardic but not significantly anemic at this time.  As he is still passing blood, will require hospitalization for monitoring of hemoglobin hematocrit, possible establishment of care with a GI physician.  Final Clinical Impression(s) / ED Diagnoses Final  diagnoses:  Lower GI bleed    Rx / DC Orders ED Discharge Orders    None       Jemel Ono, Canary Brim, MD 06/15/19 [facility]    Gilda Crease, MD 06/15/19 [phone]

## 2019-06-16 DIAGNOSIS — K922 Gastrointestinal hemorrhage, unspecified: Secondary | ICD-10-CM | POA: Diagnosis not present

## 2019-06-16 DIAGNOSIS — K5731 Diverticulosis of large intestine without perforation or abscess with bleeding: Secondary | ICD-10-CM

## 2019-06-16 DIAGNOSIS — E119 Type 2 diabetes mellitus without complications: Secondary | ICD-10-CM | POA: Diagnosis not present

## 2019-06-16 DIAGNOSIS — I10 Essential (primary) hypertension: Secondary | ICD-10-CM | POA: Diagnosis not present

## 2019-06-16 LAB — CBC
HCT: 31 % — ABNORMAL LOW (ref 39.0–52.0)
Hemoglobin: 9.9 g/dL — ABNORMAL LOW (ref 13.0–17.0)
MCH: 32.4 pg (ref 26.0–34.0)
MCHC: 31.9 g/dL (ref 30.0–36.0)
MCV: 101.3 fL — ABNORMAL HIGH (ref 80.0–100.0)
Platelets: 288 10*3/uL (ref 150–400)
RBC: 3.06 MIL/uL — ABNORMAL LOW (ref 4.22–5.81)
RDW: 12 % (ref 11.5–15.5)
WBC: 5.4 10*3/uL (ref 4.0–10.5)
nRBC: 0 % (ref 0.0–0.2)

## 2019-06-16 LAB — BASIC METABOLIC PANEL
Anion gap: 7 (ref 5–15)
BUN: 15 mg/dL (ref 8–23)
CO2: 27 mmol/L (ref 22–32)
Calcium: 8.9 mg/dL (ref 8.9–10.3)
Chloride: 106 mmol/L (ref 98–111)
Creatinine, Ser: 1.17 mg/dL (ref 0.61–1.24)
GFR calc Af Amer: >60 mL/min (ref >60)
GFR calc non Af Amer: >60 mL/min (ref >60)
Glucose, Bld: 94 mg/dL (ref 70–99)
Potassium: 3.8 mmol/L (ref 3.5–5.1)
Sodium: 140 mmol/L (ref 135–145)

## 2019-06-16 LAB — GLUCOSE, CAPILLARY
Glucose-Capillary: 97 mg/dL (ref 70–99)
Glucose-Capillary: 99 mg/dL (ref 70–99)

## 2019-06-16 LAB — MAGNESIUM: Magnesium: 1.7 mg/dL (ref 1.7–2.4)

## 2019-06-16 LAB — PHOSPHORUS: Phosphorus: 2.5 mg/dL (ref 2.5–4.6)

## 2019-06-16 NOTE — Discharge Instructions (Addendum)
The office of Dr. Erick Blinks, GI, will call you next week to arrange a follow up appointment.

## 2019-06-16 NOTE — Discharge Summary (Signed)
Physician Discharge Summary  Derrick Thornton TDV:[id number] DOB: 11/25/1945 DOA: 06/15/2019  PCP: Windell Hummingbird, PA-C  Admit date: 06/15/2019 Discharge date: 06/16/2019  Admitted From: Home  Discharge disposition: home   Recommendations for Outpatient Follow-Up:    Follow up with your primary care provider in one week and check CBC at that time.   Follow up with [facility] as outpatient in  2-3 weeks.   Discharge Diagnosis:   Principal Problem:   Acute GI bleeding Active Problems:   HTN (hypertension)   Type 2 diabetes mellitus ([place])   Discharge Condition: Improved.  Diet recommendation: Low sodium, heart healthy.  Carbohydrate-modified.    Wound care: None.  Code status: Full.   History of Present Illness:    Derrick Thornton is a 74 y.o. male with history of diabetes mellitus type 2, hyperlipidemia, hypertension with previous history of diverticular bleed last colonoscopy per the patient was 4 years ago has had procedure for hemorrhoids, follows with [facility] presents to the ER because of rectal bleeding.  Patient started having rectal bleeding around 4 PM day before presentation and had at least 3 episodes with clots before coming to the ER.  Over the 3 days patient has been having some left lower quadrant abdominal pain with no fever, chills or vomiting or diarrhea.  ED Course: In the ER patient was mildly tachycardic but blood pressure was stable. He was afebrile not hypoxic. Labs show hemoglobin of around 12.1 with macrocytic picture.  Creatinine of 1.3.  Patient had another episode of rectal bleeding in the ER.  Patient was then admitted for further management of acute GI bleeding likely lower GI.   Hospital Course:  Following conditions were addressed during hospitalization as listed below,  Acute GI bleeding likely lower GI bleed given the history of diverticulosis.  CT angiogram of the abdomen was performed which did not show any evidence of  active bleeding but diverticulitis of the descending and sigmoid colon and diverticula as well. GI did not feel that patient had diverticulitis.  Initially, the patient was on antibiotic which was discontinued.  Patient was gradually advanced on his diet which he tolerated without pain. Patient did have further bowel movements which was black stools but no fresh red blood during hospitalization.  GI had an impression that his GI bleed had ceased and did not recommend any further intervention while in the hospital and was ok for discharge home today.  Patient was advised the need for seeking medical attention/ hospital reach for continued bleeding.   Patient remained hemodynamically stable.  Hemoglobin of 10.6  Today from 12.1 on presentation.  Diabetes mellitus type 2.    On glimepiride at home.  Continue diabetic diet  Anemia.    Mild.  Secondary to acute blood loss from GI bleed.  Hemoglobin at 10.6 today.  Patient will need to follow-up CBC with his primary care physician.  He was advised to follow seek medical attention for ongoing bleeding.  Essential hypertension - resume losartan from home.  Hyperlipidemia on Crestor at home.   Disposition.  At this time, patient is stable for disposition home.  Patient will follow up with PCP in liver with GI as outpatient.  Medical Consultants:    [facility]  Procedures:    None Subjective:   Today, patient feels okay.  Denies dizziness lightheadedness chest pain.  He had a bowel movement which appeared to be dark but no bright red blood.  Discharge Exam:  Vitals:   06/16/19 0808 06/16/19 1131  BP: 111/84 (!) 121/97  Pulse: 78 98  Resp:    Temp: 98 F (36.7 C) 97.7 F (36.5 C)  SpO2: 98% 95%   Vitals:   06/15/19 2047 06/16/19 0437 06/16/19 0808 06/16/19 1131  BP:  118/78 111/84 (!) 121/97  Pulse:  74 78 98  Resp:  16    Temp:  98 F (36.7 C) 98 F (36.7 C) 97.7 F (36.5 C)  TempSrc:  Oral Oral Oral  SpO2: 95% 98% 98% 95%    Weight:      Height:       General: Alert awake, not in obvious distress HENT: pupils equally reacting to light and accommodation.  No scleral pallor or icterus noted. Oral mucosa is moist.  Chest:  Clear breath sounds.  Diminished breath sounds bilaterally. No crackles or wheezes.  CVS: S1 &S2 heard. No murmur.  Regular rate and rhythm. Abdomen: Soft, nontender, nondistended.  Bowel sounds are heard.   Extremities: No cyanosis, clubbing or edema.  Peripheral pulses are palpable. Psych: Alert, awake and oriented, normal mood CNS:  No cranial nerve deficits.  Power equal in all extremities.   Skin: Warm and dry.  No rashes noted.  The results of significant diagnostics from this hospitalization (including imaging, microbiology, ancillary and laboratory) are listed below for reference.     Diagnostic Studies:   CT Angio Abd/Pel w/ and/or w/o  Result Date: 06/15/2019 CLINICAL DATA:  74 year old male with a history of rectal bleeding and decreasing hemoglobin. History of diverticulitis. Evaluate for active lower GI bleeding. EXAM: CTA ABDOMEN AND PELVIS WITHOUT AND WITH CONTRAST TECHNIQUE: Multidetector CT imaging of the abdomen and pelvis was performed using the standard protocol during bolus administration of intravenous contrast. Multiplanar reconstructed images and MIPs were obtained and reviewed to evaluate the vascular anatomy. CONTRAST:  OMNIPAQUE IOHEXOL 350 MG/ML SOLN COMPARISON:  Prior CT a abdomen and pelvis 05/03/2018 FINDINGS: VASCULAR Aorta: Mild heterogeneous atherosclerotic plaque. No evidence of aneurysm, dissection or penetrating ulcer. Celiac: Patent without evidence of aneurysm, dissection, vasculitis or significant stenosis. SMA: Patent without evidence of aneurysm, dissection, vasculitis or significant stenosis. Renals: Both renal arteries are patent without evidence of aneurysm, dissection, vasculitis, fibromuscular dysplasia or significant stenosis. IMA: Patent without  evidence of aneurysm, dissection, vasculitis or significant stenosis. Inflow: Patent without evidence of aneurysm, dissection, vasculitis or significant stenosis. Proximal Outflow: Bilateral common femoral and visualized portions of the superficial and profunda femoral arteries are patent without evidence of aneurysm, dissection, vasculitis or significant stenosis. Veins: No venous abnormality. Patent hepatic, portal, visceral and renal veins. Patent IVC and iliac veins. Review of the MIP images confirms the above findings. NON-VASCULAR Lower chest: No acute abnormality. Hepatobiliary: Normal hepatic contour and morphology. No discrete hepatic lesions. Normal appearance of the gallbladder. No intra or extrahepatic biliary ductal dilatation. Pancreas: Unremarkable. No pancreatic ductal dilatation or surrounding inflammatory changes. Spleen: Normal in size without focal abnormality. Adrenals/Urinary Tract: Normal adrenal glands. No evidence of hydronephrosis or enhancing renal mass. Stones are present in the lower pole of the left kidney. The largest measures 1 cm in diameter. Additionally, circumscribed water attenuation lesions without enhancement are present in the left kidney consistent with simple cysts. The ureters and bladder are unremarkable. Stomach/Bowel: Unremarkable distal esophagus, stomach and proximal small bowel. No evidence of focal bowel wall thickening or obstruction. Normal appendix in the right lower quadrant. Fairly extensive colonic diverticulosis is present primarily affecting the descending and sigmoid colon.  There are several very large diverticula with inspissated and partially calcified stool. There is no evidence of active bleeding on the arterial or venous phase series. Moderately high colonic stool burden. Lymphatic: No suspicious lymphadenopathy. Reproductive: Prostate is unremarkable. Other: No abdominal wall hernia or abnormality. No abdominopelvic ascites. Musculoskeletal: No acute  fracture or aggressive appearing lytic or blastic osseous lesion. Multilevel lumbar facet arthropathy. Mild degenerative anterolisthesis (grade 1) of L4 on L5. No evidence of spondylolysis. Degenerative endplate spurring throughout the visualized thoracic and lumbar spine. IMPRESSION: VASCULAR 1. No evidence of active bleeding at this time. 2. No significant stenosis, occlusion, dissection, aneurysm or other arterial abnormality. 3. Fairly minimal fibrofatty atherosclerotic plaque along the abdominal aorta. Aortic Atherosclerosis (ICD10-170.0). NON-VASCULAR 1. Descending and sigmoid colonic diverticulitis with multiple large diverticula containing inspissated and partially calcified stool. This may represent a source for intermittent lower GI bleeding. 2. Moderately large colonic stool burden suggests constipation. 3. Nonobstructing left lower pole nephrolithiasis. The largest stone measures up to 1 cm. 4. 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 Electronically Signed   By: Malachy MoanHeath  McCullough M.D.   On: 06/15/2019 10:09     Labs:   Basic Metabolic Panel: Recent Labs  Lab 06/15/19 0323 06/16/19 0339  NA 138 140  K 4.1 3.8  CL 104 106  CO2 26 27  GLUCOSE 122* 94  BUN 16 15  CREATININE 1.33* 1.17  CALCIUM 9.3 8.9  MG  --  1.7  PHOS  --  2.5   GFR Estimated Creatinine Clearance: 57.9 mL/min (by C-G formula based on SCr of 1.17 mg/dL). Liver Function Tests: No results for input(s): AST, ALT, ALKPHOS, BILITOT, PROT, ALBUMIN in the last 168 hours. No results for input(s): LIPASE, AMYLASE in the last 168 hours. No results for input(s): AMMONIA in the last 168 hours. Coagulation profile No results for input(s): INR, PROTIME in the last 168 hours.  CBC: Recent Labs  Lab 06/15/19 0323 06/15/19 0323 06/15/19 0640 06/15/19 1201 06/15/19 1440 06/15/19 1912 06/16/19 0339  WBC 6.0   < > 5.2 5.2 5.3 5.4 5.4  NEUTROABS 3.5  --   --   --   --   --   --   HGB 12.1*   < > 10.6*  10.5* 10.6* 10.1* 9.9*  HCT 37.7*   < > 33.4* 32.3* 32.6* 30.4* 31.0*  MCV 101.6*   < > 101.2* 102.2* 100.6* 99.3 101.3*  PLT 345   < > 306 293 279 277 288   < > = values in this interval not displayed.   Cardiac Enzymes: No results for input(s): CKTOTAL, CKMB, CKMBINDEX, TROPONINI in the last 168 hours. BNP: Invalid input(s): POCBNP CBG: Recent Labs  Lab 06/15/19 0646 06/15/19 1236 06/15/19 1702 06/16/19 0031 06/16/19 0806  GLUCAP 94 118* 166* 99 97   D-Dimer No results for input(s): DDIMER in the last 72 hours. Hgb A1c No results for input(s): HGBA1C in the last 72 hours. Lipid Profile No results for input(s): CHOL, HDL, LDLCALC, TRIG, CHOLHDL, LDLDIRECT in the last 72 hours. Thyroid function studies No results for input(s): TSH, T4TOTAL, T3FREE, THYROIDAB in the last 72 hours.  Invalid input(s): FREET3 Anemia work up No results for input(s): VITAMINB12, FOLATE, FERRITIN, TIBC, IRON, RETICCTPCT in the last 72 hours. Microbiology Recent Results (from the past 240 hour(s))  Respiratory Panel by RT PCR (Flu A&B, Covid) - Nasopharyngeal Swab     Status: None   Collection Time: 06/15/19  6:41 AM   Specimen: Nasopharyngeal  Swab  Result Value Ref Range Status   SARS Coronavirus 2 by RT PCR NEGATIVE NEGATIVE Final    Comment: (NOTE) SARS-CoV-2 target nucleic acids are NOT DETECTED. The SARS-CoV-2 RNA is generally detectable in upper respiratoy specimens during the acute phase of infection. The lowest concentration of SARS-CoV-2 viral copies this assay can detect is 131 copies/mL. A negative result does not preclude SARS-Cov-2 infection and should not be used as the sole basis for treatment or other patient management decisions. A negative result may occur with  improper specimen collection/handling, submission of specimen other than nasopharyngeal swab, presence of viral mutation(s) within the areas targeted by this assay, and inadequate number of viral copies (<131  copies/mL). A negative result must be combined with clinical observations, patient history, and epidemiological information. The expected result is Negative. Fact Sheet for Patients:  https://www.moore.com/ Fact Sheet for Healthcare Providers:  https://www.young.biz/ This test is not yet ap proved or cleared by the [place] FDA and  has been authorized for detection and/or diagnosis of SARS-CoV-2 by FDA under an Emergency Use Authorization (EUA). This EUA will remain  in effect (meaning this test can be used) for the duration of the COVID-19 declaration under Section 564(b)(1) of the Act, 21 U.S.C. section 360bbb-3(b)(1), unless the authorization is terminated or revoked sooner.    Influenza A by PCR NEGATIVE NEGATIVE Final   Influenza B by PCR NEGATIVE NEGATIVE Final    Comment: (NOTE) The Xpert Xpress SARS-CoV-2/FLU/RSV assay is intended as an aid in  the diagnosis of influenza from Nasopharyngeal swab specimens and  should not be used as a sole basis for treatment. Nasal washings and  aspirates are unacceptable for Xpert Xpress SARS-CoV-2/FLU/RSV  testing. Fact Sheet for Patients: https://www.moore.com/ Fact Sheet for Healthcare Providers: https://www.young.biz/ This test is not yet approved or cleared by the [place] FDA and  has been authorized for detection and/or diagnosis of SARS-CoV-2 by  FDA under an Emergency Use Authorization (EUA). This EUA will remain  in effect (meaning this test can be used) for the duration of the  Covid-19 declaration under Section 564(b)(1) of the Act, 21  U.S.C. section 360bbb-3(b)(1), unless the authorization is  terminated or revoked. Performed at [facility], [address] [place], [place] [address]      Discharge Instructions:   Discharge Instructions    Diet - low sodium heart healthy   Complete by: As directed    Discharge instructions    Complete by: As directed    Follow up with your primary care physician in 1 week.  Check blood work at that time.  If experiencing ongoing bright red bleeding please seek medical attention.  Follow-up with your [facility] as outpatient in 2 to 3 weeks.   Increase activity slowly   Complete by: As directed      Allergies as of 06/16/2019   No Known Allergies     Medication List    TAKE these medications   fenofibrate 160 MG tablet Take 160 mg by mouth daily.   ferrous sulfate 325 (65 FE) MG tablet Take 325 mg by mouth daily as needed (anemia).   Garlic 1000 MG Caps Take 2,000 mg by mouth daily.   glimepiride 4 MG tablet Commonly known as: AMARYL Take 2 mg by mouth daily as needed (for high sugar).   losartan 50 MG tablet Commonly known as: COZAAR Take 50 mg by mouth daily. Pt was supposed to start 1/15, but has not taken 1st dose  omeprazole 40 MG capsule Commonly known as: PRILOSEC Take 40 mg by mouth daily as needed (heart burn).   OVER THE COUNTER MEDICATION Take 1 tablet by mouth daily. [place]right Mood Plus   OVER THE COUNTER MEDICATION Take 1 capsule by mouth daily. BEET POWDER   PROSTATE HEALTH PO Take 2 tablets by mouth daily.   rosuvastatin 10 MG tablet Commonly known as: CRESTOR Take 10 mg by mouth daily.   tadalafil 5 MG tablet Commonly known as: CIALIS Take 5 mg by mouth daily as needed for erectile dysfunction.   tamsulosin 0.4 MG Caps capsule Commonly known as: FLOMAX Take 0.4 mg by mouth daily.   Vitamin D (Cholecalciferol) 10 MCG (400 UNIT) Caps Take 400 Units by mouth daily as needed (pt prefer).       Time coordinating discharge: 39 minutes  Signed:  Anthem Frazer  [facility] 06/16/2019, 1:51 PM

## 2019-06-16 NOTE — Progress Notes (Signed)
Progress Note   Subjective  Patient feeling well this morning Had a bowel movement with dark/old blood, no fresh or red blood No other complaints   Objective  Vital signs in last 24 hours: Temp:  [97.7 F (36.5 C)-98.8 F (37.1 C)] 97.7 F (36.5 C) (01/16 1131) Pulse Rate:  [73-98] 98 (01/16 1131) Resp:  [13-18] 16 (01/16 0437) BP: (109-133)/(71-97) 121/97 (01/16 1131) SpO2:  [94 %-98 %] 95 % (01/16 1131) Weight:  [78.6 kg] 78.6 kg (01/15 1646) Last BM Date: 06/15/19  General: Alert, well-developed, in NAD Heart:  Regular rate and rhythm; no murmurs Chest: Clear to ascultation bilaterally Abdomen:  Soft, nontender and nondistended. Normal bowel sounds, without guarding, and without rebound.   Extremities:  Without edema. Neurologic:  Alert and  oriented x4; grossly normal neurologically. Psych:  Alert and cooperative. Normal mood and affect.  Intake/Output from previous day: 01/15 0701 - 01/16 0700 In: 1239.9 [P.O.:240; I.V.:999.9] Out: 500 [Urine:500] Intake/Output this shift: Total I/O In: -  Out: 750 [Urine:750]  Lab Results: Recent Labs    06/15/19 1440 06/15/19 1912 06/16/19 0339  WBC 5.3 5.4 5.4  HGB 10.6* 10.1* 9.9*  HCT 32.6* 30.4* 31.0*  PLT 279 277 288   BMET Recent Labs    06/15/19 0323 06/16/19 0339  NA 138 140  K 4.1 3.8  CL 104 106  CO2 26 27  GLUCOSE 122* 94  BUN 16 15  CREATININE 1.33* 1.17  CALCIUM 9.3 8.9   Studies/Results: CT Angio Abd/Pel w/ and/or w/o  Result Date: 06/15/2019 CLINICAL DATA:  74 year old male with a history of rectal bleeding and decreasing hemoglobin. History of diverticulitis. Evaluate for active lower GI bleeding. EXAM: CTA ABDOMEN AND PELVIS WITHOUT AND WITH CONTRAST TECHNIQUE: Multidetector CT imaging of the abdomen and pelvis was performed using the standard protocol during bolus administration of intravenous contrast. Multiplanar reconstructed images and MIPs were obtained and reviewed to evaluate the  vascular anatomy. CONTRAST:  OMNIPAQUE IOHEXOL 350 MG/ML SOLN COMPARISON:  Prior CT a abdomen and pelvis 05/03/2018 FINDINGS: VASCULAR Aorta: Mild heterogeneous atherosclerotic plaque. No evidence of aneurysm, dissection or penetrating ulcer. Celiac: Patent without evidence of aneurysm, dissection, vasculitis or significant stenosis. SMA: Patent without evidence of aneurysm, dissection, vasculitis or significant stenosis. Renals: Both renal arteries are patent without evidence of aneurysm, dissection, vasculitis, fibromuscular dysplasia or significant stenosis. IMA: Patent without evidence of aneurysm, dissection, vasculitis or significant stenosis. Inflow: Patent without evidence of aneurysm, dissection, vasculitis or significant stenosis. Proximal Outflow: Bilateral common femoral and visualized portions of the superficial and profunda femoral arteries are patent without evidence of aneurysm, dissection, vasculitis or significant stenosis. Veins: No venous abnormality. Patent hepatic, portal, visceral and renal veins. Patent IVC and iliac veins. Review of the MIP images confirms the above findings. NON-VASCULAR Lower chest: No acute abnormality. Hepatobiliary: Normal hepatic contour and morphology. No discrete hepatic lesions. Normal appearance of the gallbladder. No intra or extrahepatic biliary ductal dilatation. Pancreas: Unremarkable. No pancreatic ductal dilatation or surrounding inflammatory changes. Spleen: Normal in size without focal abnormality. Adrenals/Urinary Tract: Normal adrenal glands. No evidence of hydronephrosis or enhancing renal mass. Stones are present in the lower pole of the left kidney. The largest measures 1 cm in diameter. Additionally, circumscribed water attenuation lesions without enhancement are present in the left kidney consistent with simple cysts. The ureters and bladder are unremarkable. Stomach/Bowel: Unremarkable distal esophagus, stomach and proximal small bowel. No  evidence of focal bowel wall thickening or obstruction. Normal  appendix in the right lower quadrant. Fairly extensive colonic diverticulosis is present primarily affecting the descending and sigmoid colon. There are several very large diverticula with inspissated and partially calcified stool. There is no evidence of active bleeding on the arterial or venous phase series. Moderately high colonic stool burden. Lymphatic: No suspicious lymphadenopathy. Reproductive: Prostate is unremarkable. Other: No abdominal wall hernia or abnormality. No abdominopelvic ascites. Musculoskeletal: No acute fracture or aggressive appearing lytic or blastic osseous lesion. Multilevel lumbar facet arthropathy. Mild degenerative anterolisthesis (grade 1) of L4 on L5. No evidence of spondylolysis. Degenerative endplate spurring throughout the visualized thoracic and lumbar spine. IMPRESSION: VASCULAR 1. No evidence of active bleeding at this time. 2. No significant stenosis, occlusion, dissection, aneurysm or other arterial abnormality. 3. Fairly minimal fibrofatty atherosclerotic plaque along the abdominal aorta. Aortic Atherosclerosis (ICD10-170.0). NON-VASCULAR 1. Descending and sigmoid colonic diverticulitis with multiple large diverticula containing inspissated and partially calcified stool. This may represent a source for intermittent lower GI bleeding. 2. Moderately large colonic stool burden suggests constipation. 3. Nonobstructing left lower pole nephrolithiasis. The largest stone measures up to 1 cm. 4. 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 Electronically Signed   By: Jacqulynn Cadet M.D.   On: 06/15/2019 10:09      Assessment & Recommendations  74 yo male with acute lower GI bleeding in the setting of colonic diverticulosis.  1.  Lower GI bleed --very likely now resolved diverticular hemorrhage.  Hemoglobin is stable.  No further bleeding/hematochezia.  He is feeling well.  Abdomen is not tender, I do  not think he has diverticulitis and thus antibiotics are not needed.  We discussed how diverticular hemorrhage can stutter, but for now it appears the bleeding has stopped.  I will advance diet.  If no bleeding the rest of the day he may be able to be discharged this evening.  He understands that should bleeding resume he would likely need to come back to the hospital.  I will see him in follow-up for continuity --Advance diet to regular --If no further bleeding can likely be discharged this evening versus tomorrow --No role for antibiotics --I will arrange outpatient follow-up with GI        LOS: 1 day   Jerene Bears  06/16/2019, 11:51 AM

## 2019-06-16 NOTE — Care Management CC44 (Signed)
Condition Code 44 Documentation Completed  Patient Details  Name: Derrick Thornton MRN: [id number] Date of Birth: March 12, 1946   Condition Code 44 given:  Yes Patient signature on Condition Code 44 notice:  Yes Documentation of 2 MD's agreement:  Yes Code 44 added to claim:  Yes    Lawerance Sabal, RN 06/16/2019, 2:54 PM

## 2019-06-16 NOTE — Care Management Obs Status (Signed)
MEDICARE OBSERVATION STATUS NOTIFICATION   Patient Details  Name: Derrick Thornton MRN: [id number] Date of Birth: 1945/11/13   Medicare Observation Status Notification Given:  Yes    Lawerance Sabal, RN 06/16/2019, 2:54 PM

## 2019-06-18 ENCOUNTER — Telehealth: Payer: Self-pay

## 2019-06-18 NOTE — Telephone Encounter (Signed)
-----   Message from Beverley Fiedler, MD sent at 06/16/2019 12:00 PM EST ----- GI following after diverticular bleeding, can see me in 2 months or so We will need records from [facility] medical  Thanks JMP

## 2019-06-18 NOTE — Telephone Encounter (Signed)
Pt scheduled to see Dr. Rhea Belton 08/06/19@1 :30pm, appt letter mailed to pt.

## 2019-07-09 ENCOUNTER — Telehealth: Payer: Self-pay | Admitting: Internal Medicine

## 2019-07-09 NOTE — Telephone Encounter (Signed)
Patient is calling- He thought he had an appointment today with Dr. Rhea Belton but let him know it wasn't until 3/8- he stated that is fine but did want to speak to you about some of his symptoms. He did ask for a return call tomorrow morning 2/9. States that he is having to stay at his mothers today and taking her to an appointment and his cell phone is dead.

## 2019-07-11 NOTE — Telephone Encounter (Signed)
Pt states he was scared when he came home. Reports the antibiotic he had IV in the hospital killed all his normal flora and he was having a hard time passing stool. He started eating yogurt with a probiotic in it and taking 2 tsp of olive oil daily. Reports he is passing stool now. Reports he read online that if your "boo boo is yellow it means you have colon cancer." States his stool is light brown. Discussed with pt that he might try an oral probiotic OTC and a stool softener. Pt states the olive oil is good for you and his is flavored with pepper so it doesn't taste bad, just burns going down your throat. Pt will keep recommendations in mind though. He will keep his appt as scheduled, he wanted all this information added to his record.

## 2019-08-03 ENCOUNTER — Encounter: Payer: Self-pay | Admitting: *Deleted

## 2019-08-06 ENCOUNTER — Encounter: Payer: Self-pay | Admitting: Internal Medicine

## 2019-08-06 ENCOUNTER — Ambulatory Visit (INDEPENDENT_AMBULATORY_CARE_PROVIDER_SITE_OTHER): Payer: Medicare Other | Admitting: Internal Medicine

## 2019-08-06 ENCOUNTER — Telehealth: Payer: Self-pay | Admitting: Internal Medicine

## 2019-08-06 VITALS — BP 106/58 | HR 80 | Temp 98.5°F | Ht 69.0 in | Wt 181.0 lb

## 2019-08-06 DIAGNOSIS — K219 Gastro-esophageal reflux disease without esophagitis: Secondary | ICD-10-CM

## 2019-08-06 DIAGNOSIS — K5731 Diverticulosis of large intestine without perforation or abscess with bleeding: Secondary | ICD-10-CM | POA: Diagnosis not present

## 2019-08-06 DIAGNOSIS — Z8719 Personal history of other diseases of the digestive system: Secondary | ICD-10-CM | POA: Diagnosis not present

## 2019-08-06 DIAGNOSIS — K5900 Constipation, unspecified: Secondary | ICD-10-CM | POA: Diagnosis not present

## 2019-08-06 NOTE — Telephone Encounter (Signed)
Pt reported that he is taking omeprazole 40 mg daily.  He will call back to schedule EGD.

## 2019-08-06 NOTE — Telephone Encounter (Signed)
Medication added to patients list

## 2019-08-06 NOTE — Progress Notes (Signed)
Subjective:    Patient ID: Derrick Thornton, male    DOB: June 10, 1945, 74 y.o.   MRN: [id number]  HPI Derrick Thornton is a 74 year old male with PMH of colonic diverticulosis with recent lower GI bleed felt to be due to diverticulosis, GERD with history of Barrett's esophagus, hypertension, diabetes who is seen in hospital follow-up.  He is here alone today.  I saw him in January where he was admitted for hematochezia.  He was treated conservatively and diverticular hemorrhage resolved.  He did drop his hemoglobin by 2 to 3 g.  He did not require transfusion.  He left the hospital and did have several days of constipation but this has improved.  He has had no further blood in his stool or melena.  No abdominal pain.  He does have some heartburn and has not been using omeprazole recently though he was on this for years for his reflux and Barrett's esophagus.  He has had procedures at [facility] which I have reviewed.  He had EGD in 2018 and 2019.  Barrett's biopsies were performed in 2018 which showed Barrett's without dysplasia.  In 2019 he had another EGD but Barrett's biopsies were not performed.  He did have unremarkable gastric biopsies without H. pylori and more proximal esophageal biopsies which were normal and negative for eosinophilic esophagitis.  He had colonoscopy in 2018 in 2019 at [facility].  Diverticulosis was seen extensively in the left colon and ascending colon.  On 1 of these occasions he had a hyperplastic polyp removed from the sigmoid.  No report on these procedures of adenomatous polyps.  Prep was listed as interfering with visualization.   Review of Systems As per HPI, otherwise negative  Current Medications, Allergies, Past Medical History, Past Surgical History, Family History and Social History were reviewed in [employer] record.     Objective:   Physical Exam BP (!) 106/58    Pulse 80    Temp 98.5 F (36.9 C)    Ht 5\' 9"  (1.753 m)     Wt 181 lb (82.1 kg)    BMI 26.73 kg/m  Gen: awake, alert, NAD HEENT: anicteric, op clear CV: RRR, no mrg Pulm: CTA b/l Abd: soft, NT/ND, +BS throughout Ext: no c/c/e Neuro: nonfocal  CTA ABDOMEN AND PELVIS WITHOUT AND WITH CONTRAST   TECHNIQUE: Multidetector CT imaging of the abdomen and pelvis was performed using the standard protocol during bolus administration of intravenous contrast. Multiplanar reconstructed images and MIPs were obtained and reviewed to evaluate the vascular anatomy.   CONTRAST:  OMNIPAQUE IOHEXOL 350 MG/ML SOLN   COMPARISON:  Prior CT a abdomen and pelvis 05/03/2018   FINDINGS: VASCULAR   Aorta: Mild heterogeneous atherosclerotic plaque. No evidence of aneurysm, dissection or penetrating ulcer.   Celiac: Patent without evidence of aneurysm, dissection, vasculitis or significant stenosis.   SMA: Patent without evidence of aneurysm, dissection, vasculitis or significant stenosis.   Renals: Both renal arteries are patent without evidence of aneurysm, dissection, vasculitis, fibromuscular dysplasia or significant stenosis.   IMA: Patent without evidence of aneurysm, dissection, vasculitis or significant stenosis.   Inflow: Patent without evidence of aneurysm, dissection, vasculitis or significant stenosis.   Proximal Outflow: Bilateral common femoral and visualized portions of the superficial and profunda femoral arteries are patent without evidence of aneurysm, dissection, vasculitis or significant stenosis.   Veins: No venous abnormality. Patent hepatic, portal, visceral and renal veins. Patent IVC and iliac veins.   Review  of the MIP images confirms the above findings.   NON-VASCULAR   Lower chest: No acute abnormality.   Hepatobiliary: Normal hepatic contour and morphology. No discrete hepatic lesions. Normal appearance of the gallbladder. No intra or extrahepatic biliary ductal dilatation.   Pancreas: Unremarkable. No  pancreatic ductal dilatation or surrounding inflammatory changes.   Spleen: Normal in size without focal abnormality.   Adrenals/Urinary Tract: Normal adrenal glands. No evidence of hydronephrosis or enhancing renal mass. Stones are present in the lower pole of the left kidney. The largest measures 1 cm in diameter. Additionally, circumscribed water attenuation lesions without enhancement are present in the left kidney consistent with simple cysts. The ureters and bladder are unremarkable.   Stomach/Bowel: Unremarkable distal esophagus, stomach and proximal small bowel. No evidence of focal bowel wall thickening or obstruction. Normal appendix in the right lower quadrant. Fairly extensive colonic diverticulosis is present primarily affecting the descending and sigmoid colon. There are several very large diverticula with inspissated and partially calcified stool. There is no evidence of active bleeding on the arterial or venous phase series. Moderately high colonic stool burden.   Lymphatic: No suspicious lymphadenopathy.   Reproductive: Prostate is unremarkable.   Other: No abdominal wall hernia or abnormality. No abdominopelvic ascites.   Musculoskeletal: No acute fracture or aggressive appearing lytic or blastic osseous lesion. Multilevel lumbar facet arthropathy. Mild degenerative anterolisthesis (grade 1) of L4 on L5. No evidence of spondylolysis. Degenerative endplate spurring throughout the visualized thoracic and lumbar spine.   IMPRESSION: VASCULAR   1. No evidence of active bleeding at this time. 2. No significant stenosis, occlusion, dissection, aneurysm or other arterial abnormality. 3. Fairly minimal fibrofatty atherosclerotic plaque along the abdominal aorta. Aortic Atherosclerosis (ICD10-170.0).   NON-VASCULAR   1. Descending and sigmoid colonic diverticulitis with multiple large diverticula containing inspissated and partially calcified stool. This may  represent a source for intermittent lower GI bleeding. 2. Moderately large colonic stool burden suggests constipation. 3. Nonobstructing left lower pole nephrolithiasis. The largest stone measures up to 1 cm. 4. 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     Electronically Signed   By: Malachy Moan M.D.   On: 06/15/2019 10:09      Assessment & Plan:  74 year old male with PMH of colonic diverticulosis with recent lower GI bleed felt to be due to diverticulosis, GERD with history of Barrett's esophagus, hypertension, diabetes who is seen in hospital follow-up.  1.  Recent lower GI bleed/colonic diverticulosis --very likely diverticulosis was a source of his lower GI bleeding.  He has had 3 such episodes in his past.  No ongoing bleeding.  We discussed this today and there is no great preventative treatment for diverticular hemorrhage.  We should check a CBC to ensure his blood counts have improved.  I am going to check a ferritin as well. --CBC and iron studies  2.  GERD with history of Barrett's esophagus --he has not been on PPI therapy which we discussed today.  I do recommend that he resume his omeprazole 40 mg daily.  Given that his symptoms seem to be worse at night he will take this 30 minutes before his last meal of the day. --Omeprazole 40 mg daily --Upper endoscopy is recommended in May or June of this year for Barrett's surveillance.  His last Barrett's biopsies were 3 years ago in May  3.  Colon cancer screening --no adenomatous polyps at colonoscopy in 2018 or 19.  Prep listed as less than good.  Would repeat in 5 years thus 2024  4.  Mild constipation --I encouraged to try Metamucil 1-2 heaping tablespoons daily  30 minutes total spent today including patient facing time, coordination of care, reviewing medical history/procedures/pertinent radiology studies, and documentation of the encounter.

## 2019-08-06 NOTE — Patient Instructions (Signed)
Please resume your omeprazole. Take this daily before dinner (call us back with dosage).  You are due for an endoscopy. Please contact us at [phone] so you can get this scheduled for May or June 2021 once you have a chance to look over your schedule.  You will be due for a recall colonoscopy in 09/2022. We will send you a reminder in the mail when it gets closer to that time.  Please purchase the following medications over the counter and take as directed: Metamucil 1 tablespoon daily  If you are age 72 or older, your body mass index should be between 23-30. Your Body mass index is 26.73 kg/m. If this is out of the aforementioned range listed, please consider follow up with your Primary Care Provider.  If you are age 105 or younger, your body mass index should be between 19-25. Your Body mass index is 26.73 kg/m. If this is out of the aformentioned range listed, please consider follow up with your Primary Care Provider.

## 2019-08-08 ENCOUNTER — Telehealth: Payer: Self-pay | Admitting: *Deleted

## 2019-08-08 DIAGNOSIS — Z8719 Personal history of other diseases of the digestive system: Secondary | ICD-10-CM

## 2019-08-08 DIAGNOSIS — D649 Anemia, unspecified: Secondary | ICD-10-CM

## 2019-08-08 NOTE — Telephone Encounter (Signed)
I have spoken to patient to ask that he come for labwork either this week or early next week. He verbalizes understanding and indicates he will do this.

## 2019-08-08 NOTE — Telephone Encounter (Signed)
I have left a message for patient to call back. Orders entered in epic for labs.

## 2019-08-08 NOTE — Telephone Encounter (Signed)
-----   Message from Beverley Fiedler, MD sent at 08/06/2019  5:40 PM EST ----- Meant to have him get a CBC and iron studiesHe can come back for these

## 2020-01-19 ENCOUNTER — Other Ambulatory Visit: Payer: Self-pay

## 2020-01-19 ENCOUNTER — Telehealth: Payer: Self-pay | Admitting: Nurse Practitioner

## 2020-01-19 ENCOUNTER — Encounter (HOSPITAL_COMMUNITY): Payer: Self-pay | Admitting: Emergency Medicine

## 2020-01-19 ENCOUNTER — Inpatient Hospital Stay (HOSPITAL_COMMUNITY)
Admission: EM | Admit: 2020-01-19 | Discharge: 2020-01-24 | DRG: 378 | Disposition: A | Discharge status: Home / Self Care | Payer: Medicare Other | Attending: Family Medicine | Admitting: Family Medicine

## 2020-01-19 DIAGNOSIS — N200 Calculus of kidney: Secondary | ICD-10-CM | POA: Diagnosis present

## 2020-01-19 DIAGNOSIS — I700 Atherosclerosis of aorta: Secondary | ICD-10-CM | POA: Diagnosis present

## 2020-01-19 DIAGNOSIS — M549 Dorsalgia, unspecified: Secondary | ICD-10-CM | POA: Diagnosis present

## 2020-01-19 DIAGNOSIS — D62 Acute posthemorrhagic anemia: Secondary | ICD-10-CM | POA: Diagnosis present

## 2020-01-19 DIAGNOSIS — K449 Diaphragmatic hernia without obstruction or gangrene: Secondary | ICD-10-CM | POA: Diagnosis present

## 2020-01-19 DIAGNOSIS — K219 Gastro-esophageal reflux disease without esophagitis: Secondary | ICD-10-CM | POA: Diagnosis present

## 2020-01-19 DIAGNOSIS — G8929 Other chronic pain: Secondary | ICD-10-CM | POA: Diagnosis present

## 2020-01-19 DIAGNOSIS — E119 Type 2 diabetes mellitus without complications: Secondary | ICD-10-CM

## 2020-01-19 DIAGNOSIS — E785 Hyperlipidemia, unspecified: Secondary | ICD-10-CM | POA: Diagnosis present

## 2020-01-19 DIAGNOSIS — Z20822 Contact with and (suspected) exposure to covid-19: Secondary | ICD-10-CM | POA: Diagnosis present

## 2020-01-19 DIAGNOSIS — K922 Gastrointestinal hemorrhage, unspecified: Secondary | ICD-10-CM | POA: Diagnosis not present

## 2020-01-19 DIAGNOSIS — K5731 Diverticulosis of large intestine without perforation or abscess with bleeding: Principal | ICD-10-CM | POA: Diagnosis present

## 2020-01-19 DIAGNOSIS — Z8719 Personal history of other diseases of the digestive system: Secondary | ICD-10-CM

## 2020-01-19 DIAGNOSIS — Z823 Family history of stroke: Secondary | ICD-10-CM

## 2020-01-19 DIAGNOSIS — K2270 Barrett's esophagus without dysplasia: Secondary | ICD-10-CM | POA: Diagnosis present

## 2020-01-19 DIAGNOSIS — E876 Hypokalemia: Secondary | ICD-10-CM | POA: Diagnosis not present

## 2020-01-19 DIAGNOSIS — D509 Iron deficiency anemia, unspecified: Secondary | ICD-10-CM

## 2020-01-19 DIAGNOSIS — N400 Enlarged prostate without lower urinary tract symptoms: Secondary | ICD-10-CM | POA: Diagnosis present

## 2020-01-19 DIAGNOSIS — Z79899 Other long term (current) drug therapy: Secondary | ICD-10-CM

## 2020-01-19 DIAGNOSIS — I10 Essential (primary) hypertension: Secondary | ICD-10-CM | POA: Diagnosis present

## 2020-01-19 DIAGNOSIS — Z7984 Long term (current) use of oral hypoglycemic drugs: Secondary | ICD-10-CM

## 2020-01-19 LAB — CBC
HCT: 38.7 % — ABNORMAL LOW (ref 39.0–52.0)
Hemoglobin: 12.4 g/dL — ABNORMAL LOW (ref 13.0–17.0)
MCH: 33 pg (ref 26.0–34.0)
MCHC: 32 g/dL (ref 30.0–36.0)
MCV: 102.9 fL — ABNORMAL HIGH (ref 80.0–100.0)
Platelets: 279 10*3/uL (ref 150–400)
RBC: 3.76 MIL/uL — ABNORMAL LOW (ref 4.22–5.81)
RDW: 11.9 % (ref 11.5–15.5)
WBC: 6 10*3/uL (ref 4.0–10.5)
nRBC: 0 % (ref 0.0–0.2)

## 2020-01-19 LAB — COMPREHENSIVE METABOLIC PANEL
ALT: 27 U/L (ref 0–44)
AST: 21 U/L (ref 15–41)
Albumin: 3.9 g/dL (ref 3.5–5.0)
Alkaline Phosphatase: 64 U/L (ref 38–126)
Anion gap: 8 (ref 5–15)
BUN: 18 mg/dL (ref 8–23)
CO2: 27 mmol/L (ref 22–32)
Calcium: 9.4 mg/dL (ref 8.9–10.3)
Chloride: 105 mmol/L (ref 98–111)
Creatinine, Ser: 1.07 mg/dL (ref 0.61–1.24)
GFR calc Af Amer: >60 mL/min (ref >60)
GFR calc non Af Amer: >60 mL/min (ref >60)
Glucose, Bld: 104 mg/dL — ABNORMAL HIGH (ref 70–99)
Potassium: 3.9 mmol/L (ref 3.5–5.1)
Sodium: 140 mmol/L (ref 135–145)
Total Bilirubin: 0.7 mg/dL (ref 0.3–1.2)
Total Protein: 7.2 g/dL (ref 6.5–8.1)

## 2020-01-19 NOTE — Telephone Encounter (Signed)
See prior phone note from on call service. 2nd call to patient. I was able to reach him at this time. History of diverticular bleed. He starting passing dark red blood from the rectum which started 3 days ago. First day was a small amount of blood with a BM. Yesterday less blood. Today he passed a moderate amount of darker red blood and his stool is darker, looks almost black. He stated these symptoms are his typical symptoms when he's had diverticular bleed x 3 in the past, last occurred 06/2019.  He is not on anticoagulation. No CP, SOB, dizziness of abd pain. I advised the patient to go on a clear liquid diet. If he has more blood from the rectum or black stool and/or if he develops CP, SOB or dizziness he agreed to go to the ED. He will call me tomorrow with an update.

## 2020-01-19 NOTE — ED Triage Notes (Signed)
C/o bright red and dark colored rectal bleeding x 3 days.  Denies pain.

## 2020-01-19 NOTE — Telephone Encounter (Signed)
Patient called answering service with complaints of rectal bleeding. Hx of diverticular bleed. I called the patient and left a msg on his personal voicemail if he was having severe rectal bleeding, any CP,  SOB or dizziness to go to the local ED. I will attempt to call him again within the next hour.

## 2020-01-20 ENCOUNTER — Emergency Department (HOSPITAL_COMMUNITY): Payer: Medicare Other

## 2020-01-20 DIAGNOSIS — K922 Gastrointestinal hemorrhage, unspecified: Secondary | ICD-10-CM | POA: Diagnosis not present

## 2020-01-20 LAB — FERRITIN: Ferritin: 25 ng/mL (ref 24–336)

## 2020-01-20 LAB — HEMOGLOBIN AND HEMATOCRIT, BLOOD
HCT: 33.8 % — ABNORMAL LOW (ref 39.0–52.0)
Hemoglobin: 10.6 g/dL — ABNORMAL LOW (ref 13.0–17.0)

## 2020-01-20 LAB — HEMOGLOBIN A1C
Hgb A1c MFr Bld: 5.3 % (ref 4.8–5.6)
Mean Plasma Glucose: 105.41 mg/dL

## 2020-01-20 LAB — CBC
HCT: 36.8 % — ABNORMAL LOW (ref 39.0–52.0)
Hemoglobin: 11.6 g/dL — ABNORMAL LOW (ref 13.0–17.0)
MCH: 32.1 pg (ref 26.0–34.0)
MCHC: 31.5 g/dL (ref 30.0–36.0)
MCV: 101.9 fL — ABNORMAL HIGH (ref 80.0–100.0)
Platelets: 262 10*3/uL (ref 150–400)
RBC: 3.61 MIL/uL — ABNORMAL LOW (ref 4.22–5.81)
RDW: 12.1 % (ref 11.5–15.5)
WBC: 7.6 10*3/uL (ref 4.0–10.5)
nRBC: 0 % (ref 0.0–0.2)

## 2020-01-20 LAB — VITAMIN B12: Vitamin B-12: 536 pg/mL (ref 180–914)

## 2020-01-20 LAB — CBG MONITORING, ED: Glucose-Capillary: 91 mg/dL (ref 70–99)

## 2020-01-20 LAB — SARS CORONAVIRUS 2 BY RT PCR (HOSPITAL ORDER, PERFORMED IN ~~LOC~~ HOSPITAL LAB): SARS Coronavirus 2: NEGATIVE

## 2020-01-20 LAB — GLUCOSE, CAPILLARY: Glucose-Capillary: 125 mg/dL — ABNORMAL HIGH (ref 70–99)

## 2020-01-20 LAB — IRON AND TIBC
Iron: 51 ug/dL (ref 45–182)
Saturation Ratios: 13 % — ABNORMAL LOW (ref 17.9–39.5)
TIBC: 381 ug/dL (ref 250–450)
UIBC: 330 ug/dL

## 2020-01-20 LAB — POC OCCULT BLOOD, ED: Fecal Occult Bld: POSITIVE — AB

## 2020-01-20 MED ORDER — PANTOPRAZOLE SODIUM 40 MG IV SOLR
40.0000 mg | Freq: Once | INTRAVENOUS | Status: AC
  Administered 2020-01-20: 40 mg via INTRAVENOUS
  Filled 2020-01-20: qty 40

## 2020-01-20 MED ORDER — ACETAMINOPHEN 325 MG PO TABS: 650.0000 mg | ORAL_TABLET | Freq: Four times a day (QID) | ORAL | Status: DC | PRN

## 2020-01-20 MED ORDER — PEG-KCL-NACL-NASULF-NA ASC-C 100 G PO SOLR
0.5000 | Freq: Once | ORAL | Status: AC
  Administered 2020-01-20: 100 g via ORAL

## 2020-01-20 MED ORDER — SODIUM CHLORIDE 0.9 % IV SOLN: INTRAVENOUS | Status: DC

## 2020-01-20 MED ORDER — SODIUM CHLORIDE 0.9 % IV BOLUS
500.0000 mL | Freq: Once | INTRAVENOUS | Status: AC
  Administered 2020-01-20: 500 mL via INTRAVENOUS

## 2020-01-20 MED ORDER — INSULIN ASPART 100 UNIT/ML ~~LOC~~ SOLN
0.0000 [IU] | Freq: Three times a day (TID) | SUBCUTANEOUS | Status: DC
  Administered 2020-01-22: 2 [IU] via SUBCUTANEOUS
  Administered 2020-01-24: 1 [IU] via SUBCUTANEOUS

## 2020-01-20 MED ORDER — ROSUVASTATIN CALCIUM 5 MG PO TABS
10.0000 mg | ORAL_TABLET | Freq: Every day | ORAL | Status: DC
  Administered 2020-01-20 – 2020-01-24 (×5): 10 mg via ORAL
  Filled 2020-01-20 (×5): qty 2

## 2020-01-20 MED ORDER — PEG-KCL-NACL-NASULF-NA ASC-C 100 G PO SOLR: 1.0000 | Freq: Once | ORAL | Status: DC

## 2020-01-20 MED ORDER — IOHEXOL 300 MG/ML  SOLN
100.0000 mL | Freq: Once | INTRAMUSCULAR | Status: AC | PRN
  Administered 2020-01-20: 100 mL via INTRAVENOUS

## 2020-01-20 MED ORDER — PANTOPRAZOLE SODIUM 40 MG IV SOLR
40.0000 mg | Freq: Two times a day (BID) | INTRAVENOUS | Status: DC
  Administered 2020-01-20 – 2020-01-21 (×3): 40 mg via INTRAVENOUS
  Filled 2020-01-20 (×3): qty 40

## 2020-01-20 MED ORDER — ACETAMINOPHEN 650 MG RE SUPP: 650.0000 mg | Freq: Four times a day (QID) | RECTAL | Status: DC | PRN

## 2020-01-20 MED ORDER — TAMSULOSIN HCL 0.4 MG PO CAPS
0.4000 mg | ORAL_CAPSULE | Freq: Every day | ORAL | Status: DC
  Administered 2020-01-20 – 2020-01-24 (×5): 0.4 mg via ORAL
  Filled 2020-01-20 (×5): qty 1

## 2020-01-20 MED ORDER — WHITE PETROLATUM EX OINT
TOPICAL_OINTMENT | CUTANEOUS | Status: AC
  Filled 2020-01-20: qty 28.35

## 2020-01-20 MED ORDER — PEG-KCL-NACL-NASULF-NA ASC-C 100 G PO SOLR
0.5000 | Freq: Once | ORAL | Status: AC
  Administered 2020-01-20: 100 g via ORAL
  Filled 2020-01-20: qty 1

## 2020-01-20 NOTE — Consult Note (Addendum)
Referring Provider: Dr. Ronnald Nian Primary Care Physician:  System, Provider Not In Primary Gastroenterologist:  Dr.Pyrtle   Reason for Consultation:  GI bleed  HPI: Derrick Thornton is a 74 y.o. male with a past medical history of hypertension, diabetes mellitus type 2, anemia, Barrett's esophagus, diverticular bleed x 3 and colon polyps. He presented to [facility] yesterday afternoon due to having dark red rectal bleeding for 3 days and he was concerned he was having another diverticular bleed. Thursday 8/19 he passed bright red and dark red blood with an "almost black" colored loose stool x 2  with associated LLQ pain. Friday he passed a smaller amount of dark stool with darker red blood.  Yesterday, he passed a small amount of darker red blood with a black loose stool.  No associated chest pain, shortness of breath or dizziness. He contacted our on call service and he was advised to go the the ED for further evaluation. While waiting in the ED, he went to the bathroom and passed a large amount of dark red and black foamy blood which scared him. He stated he thought he was going to die. Labs in the ED showed a hemoglobin level of 12.4 (Hg 9.9. on 06/16/2019).  BUN 18.  An abdominal/pelvic CT scan showed diverticulosis without evidence of acute diverticulitis.  He received Protonix 74m IV x 1.  A GI consult was requested for further evaluation.  Currently, he complains of having mild left lower quadrant abdominal pain.  No chest pain or shortness of breath.  No further melena/hematochezia since his large bloody bowel movement in the ED last evening.  He was previously taking ferrous sulfate for anemia associated with his diverticular bleed January 2021.  However, he stopped taking the ferrous sulfate 2 months ago.  No Pepto-Bismol use.  He takes Diclofenac approximately  ten tabs monthly for the past 2 months for back and right leg pain.  He took a Prednisone taper for approximately 5 days 1  month ago due to having low energy with associated back pain.  He denies having any dysphagia.  He has intermittent indigestion.  He takes Omeprazole 40 mg 1 capsule as needed, approximately 10-12 doses monthly for the past 3 to 6 years.  He reports a prior history of Barrett's esophagus.  His last EGD was 3 years ago.  His last colon[facility] was 2 to 3 years [facility] no polyps were identified.  No family history of esophageal, gastric or colon cancer.  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 He drinks one glass of wine/beer or mixed cocktail once weekly.   He was as admitted to the hospital  06/15/2019 with a diverticular bleed. At that time, his initial Hg level was 12 and dropped to 10.6 within a few hours. He did not require a blood transfusion. An abd/pelvic CT angiography  showed descending and sigmoid colonic diverticulosis with multiple large diverticuli, no active extravasation or hemorrhage noted, moderate stool burden.  Nonobstructing left lower pole nephrolithiasis. His bleeding resolved and he was discharged home the next day. He was seen in our office by Dr. PHilarie Fredricksonon 08/06/2019. His blood counts were stable and a screening colonoscopy was recommended in 2024. A surveillance EGD was recommended May or June 2021 which was not done.   ED course: Labs 01/19/2020: Sodium 140. Potassium 3.9. Glucose 104. BUN 18. Creatinine 1.07. Anion gap 8.  Alk  phos 64. Albumin 3.9. AST 21 . ALT 27. Total bili 0.7. WBC 6.0. Hemoglobin 12.4. Hematocrit 38.7. MCV 102.9. Platelet 279.  Repeat CBC  8/22/20221 at 1107: Hemoglobin 11.6. Hematocrit 36.8. [employer] virus negative.  Abdominal/pelvic CT with contrast:  1. No evidence of acute abnormality. 2. Diffuse colonic diverticulosis without evidence of acute diverticulitis. 3. Nonobstructing LEFT renal calculi. 4. Small hiatal hernia. 5. Prostate  enlargement.   Past Medical History:  Diagnosis Date   Anemia    Aortic atherosclerosis ([place])    Barrett's esophagus    DDD (degenerative disc disease), lumbar    Diabetes mellitus without complication ([place])    Diverticulitis    Diverticulosis    GI bleed    Hiatal hernia    Hyperplastic colon polyp    Hypertension    Internal hemorrhoids    Nephrolithiasis     Past Surgical History:  Procedure Laterality Date   ACHILLES TENDON REPAIR     KNEE ARTHROSCOPY      Prior to Admission medications   Medication Sig Start Date End Date Taking? Authorizing Provider  ferrous sulfate 325 (65 FE) MG tablet Take 325 mg by mouth daily as needed (anemia).  10/04/17   [provider]  Garlic 7619 MG CAPS Take 2,000 mg by mouth daily.    [provider]  glimepiride (AMARYL) 4 MG tablet Take 2 mg by mouth daily as needed (for high sugar).     [provider]  losartan (COZAAR) 50 MG tablet Take 50 mg by mouth daily. Pt was supposed to start 1/15, but has not taken 1st dose    [provider]  Misc Natural Products ([place]) Take 2 tablets by mouth daily.    [provider]  omeprazole (PRILOSEC) 40 MG capsule Take 40 mg by mouth daily.    [provider]  OVER THE COUNTER MEDICATION Take 1 tablet by mouth daily. Zembright Mood Plus    [provider]  OVER THE COUNTER MEDICATION Take 1 capsule by mouth daily. BEET POWDER    [provider]  rosuvastatin (CRESTOR) 10 MG tablet Take 10 mg by mouth daily.    [provider]  tadalafil (CIALIS) 5 MG tablet Take 5 mg by mouth daily as needed for erectile dysfunction.  08/20/15   [provider]  tamsulosin (FLOMAX) 0.4 MG CAPS capsule Take 0.4 mg by mouth daily.    [provider]  Vitamin D, Cholecalciferol, 10 MCG (400 UNIT) CAPS Take 400 Units by mouth daily as needed (pt prefer).    [provider]    No current  facility-administered medications for this encounter.   Current Outpatient Medications  Medication Sig Dispense Refill   ferrous sulfate 325 (65 FE) MG tablet Take 325 mg by mouth daily as needed (anemia).      Garlic 5093 MG CAPS Take 2,000 mg by mouth daily.     glimepiride (AMARYL) 4 MG tablet Take 2 mg by mouth daily as needed (for high sugar).      losartan (COZAAR) 50 MG tablet Take 50 mg by mouth daily. Pt was supposed to start 1/15, but has not taken 1st dose     Misc Natural Products (PROSTATE HEALTH PO) Take 2 tablets by mouth daily.     omeprazole (PRILOSEC) 40 MG capsule Take 40 mg by mouth daily.     OVER THE COUNTER MEDICATION Take 1 tablet by mouth daily. Zembright Mood Plus     OVER  THE COUNTER MEDICATION Take 1 capsule by mouth daily. BEET POWDER     rosuvastatin (CRESTOR) 10 MG tablet Take 10 mg by mouth daily.     tadalafil (CIALIS) 5 MG tablet Take 5 mg by mouth daily as needed for erectile dysfunction.      tamsulosin (FLOMAX) 0.4 MG CAPS capsule Take 0.4 mg by mouth daily.     Vitamin D, Cholecalciferol, 10 MCG (400 UNIT) CAPS Take 400 Units by mouth daily as needed (pt prefer).      Allergies as of 01/19/2020   (No Known Allergies)    Family History  Problem Relation Age of Onset   Stroke Father    Colon cancer Neg Hx    Stomach cancer Neg Hx    Pancreatic cancer Neg Hx    Esophageal cancer Neg Hx     Social History   Socioeconomic History   Marital status: Married    Spouse name: Not on file   Number of children: Not on file   Years of education: Not on file   Highest education level: Not on file  Occupational History   Not on file  Tobacco Use   Smoking status: Never Smoker   Smokeless tobacco: Never Used  Vaping Use   Vaping Use: Never used  Substance and Sexual Activity   Alcohol use: Yes    Comment: occ   Drug use: Never   Sexual activity: Yes  Other Topics Concern   Not on file  Social History Narrative     Not on file   Social Determinants of Health   Financial Resource Strain:    Difficulty of Paying Living Expenses: Not on file  Food Insecurity:    Worried About Running Out of Food in the Last Year: Not on file   Ran Out of Food in the Last Year: Not on file  Transportation Needs:    Lack of Transportation (Medical): Not on file   Lack of Transportation (Non-Medical): Not on file  Physical Activity:    Days of Exercise per Week: Not on file   Minutes of Exercise per Session: Not on file  Stress:    Feeling of Stress : Not on file  Social Connections:    Frequency of Communication with Friends and Family: Not on file   Frequency of Social Gatherings with Friends and Family: Not on file   Attends Religious Services: Not on file   Active Member of Clubs or Organizations: Not on file   Attends Archivist Meetings: Not on file   Marital Status: Not on file  Intimate Partner Violence:    Fear of Current or Ex-Partner: Not on file   Emotionally Abused: Not on file   Physically Abused: Not on file   Sexually Abused: Not on file    Review of Systems: Gen: Denies fever, sweats or chills. No weight loss.  CV: Denies chest pain, palpitations or edema. Resp: Denies cough, shortness of breath of hemoptysis.  GI: See HPI.  GU : Decreased urine output. Denies urinary burning, blood in urine or increased frequency.  MS: Lower back pain.  Derm: Denies rash, itchiness, skin lesions or unhealing ulcers. Psych: Denies depression, anxiety, memory loss or confusion.  Heme: Denies easy bruising, bleeding. Neuro:  Denies headaches, dizziness or paresthesias. Endo:  Denies any problems with DM, thyroid or adrenal function.  Physical Exam: Vital signs in last 24 hours: Temp:  [98.3 F (36.8 C)-98.4 F (36.9 C)] 98.4 F (36.9 C) (08/22 4098)  Pulse Rate:  [73-99] 73 (08/22 1115) Resp:  [12-18] 12 (08/22 1115) BP: (102-130)/(74-92) 130/89 (08/22 1115) SpO2:  [96  %-100 %] 96 % (08/22 1115)   General:  Alert,  well-developed, well-nourished, pleasant and cooperative in NAD. Head:  Normocephalic and atraumatic. Eyes:  No scleral icterus. Conjunctiva pink. Ears:  Normal auditory acuity. Nose:  No deformity, discharge or lesions. Mouth:  Dentition intact. No ulcers or lesions.  Neck:  Supple. No lymphadenopathy or thyromegaly.  Lungs: Breath sounds clear throughout. Heart: Regular rate and rhythm, no murmurs. Abdomen: Soft, mild left lower quadrant tenderness without rebound or guarding.  No masses or organomegaly.  Positive bowel sounds all four quadrants. Rectal: No external hemorrhoids.  Questional scarring to the anterior anal area.  A small amount of solid black stool grossly heme positive in the rectal vault.  Prostate enlarged.  ER medical tech present during exam. Musculoskeletal:  Symmetrical without gross deformities.  Pulses:  Normal pulses noted. Extremities:  Without clubbing or edema. Neurologic:  Alert and  oriented x4. No focal deficits.  Skin:  Intact without significant lesions or rashes. Psych:  Alert and cooperative. Normal mood and affect.  Intake/Output from previous day: No intake/output data recorded. Intake/Output this shift: No intake/output data recorded.  Lab Results: Recent Labs    01/19/20 1946 01/20/20 1107  WBC 6.0 7.6  HGB 12.4* 11.6*  HCT 38.7* 36.8*  PLT 279 262   BMET Recent Labs    01/19/20 1946  NA 140  K 3.9  CL 105  CO2 27  GLUCOSE 104*  BUN 18  CREATININE 1.07  CALCIUM 9.4   LFT Recent Labs    01/19/20 1946  PROT 7.2  ALBUMIN 3.9  AST 21  ALT 27  ALKPHOS 64  BILITOT 0.7   PT/INR No results for input(s): LABPROT, INR in the last 72 hours. Hepatitis Panel No results for input(s): HEPBSAG, HCVAB, HEPAIGM, HEPBIGM in the last 72 hours.    Studies/Results: No results found.  IMPRESSION/PLAN:  5.  74 year old male with a past history of diverticular bleed x 3 presents to the  hospital  with LLQ pain and hematochezia (bright red and dark red blood per the rectum) with melenic stool. Hg 12.4 -> 11.6. CTAP showed diverticulosis without evidence of acute diverticulitis.  -Normal saline 500 cc IV bolus x1 now then 75 cc an hour -Clear liquid diet -N.p.o. after midnight -Monitor H&H closely. Repeat H/H 5pm -NSAID use for hemoglobin less than eight -EGD and colonoscopy 01/21/2020 with Dr. Fuller Plan  2. History of GERD.  Barrett's esophagus. -See Plan in # 1  3. Acute on chronic macrocytic anemia -check B12 levels and iron level with next lab draw  4. DM II  Further recommendations per Dr. Maurine Minister Dorathy Daft  01/20/2020, 12:07 PM

## 2020-01-20 NOTE — H&P (Addendum)
[facility] History and Physical Service Pager: [phone]  Patient name: Derrick Thornton Medical record number: [id number] Date of birth: 07/06/1945 Age: 74 y.o. Gender: male  Primary Care Provider: System, Provider Not In Consultants: GI Code Status: Full Code  Preferred Emergency Contact: Wife, Fredderick Severanceatti F [place], [phone]  Chief Complaint: Melena and abdominal pain   Assessment and Plan: Derrick Thornton is a 74 y.o. male presenting with acute GI bleed. PMH is significant for his esophagus, colon polyps, diverticulosis/diverticulitis, hypertension, HLD, anemia, ED, GERD, T2DM.  Acute GI bleed  history of diverticulosis/diverticulitis Patient has a history of GERD and prescribed omeprazole 40 mg at home medication.  Patient reported history of diclofenac and prednisone use within the last two weeks. WBC 7.6, Hgb 11.6, MCV 101.9.  FOBT positive.  Sodium 140 potassium 3.9 BUN 18 creatinine 1.07 AG 8.  ALP 64 albumin 3.9 AST 21 ALT 27 total bili 0.7. CT abdomen pelvis found no evidence of acute abnormality but did note diffuse colonic diverticulosis without evidence of acute diverticulitis.  Also noted non-obstructing left renal calculi with a small hiatal hernia and prostate enlargement. ED MD saw 1 episode and noted stool appeared to be melanotic on exam.  GI saw patient and plan to do EGD and colonoscopy tomorrow.  HR 73 BP 130/89.  Patient remains afebrile -Admit to MedSurg, Dr. McDiarmid FPTS -Consult GI, appreciate recommendations  -monitor Hgb with daily CBC  -Colonoscopy prep -Transfuse for Hgb< 8, per GI -EGD and colonoscopy in a.m. with Dr. Russella DarStark -Continue NS mIVF 75 mL/h -Transition patient to Protonix IV 40 inpatient  HTN Patient was prescribed losartan but patient reports that he never started this medication.  Patient reports taking beet powder and other herbals for BP.  Patient remains normotensive on admission.  BP range  123/74-130/89. -We will continue to monitor with vitals  HLD  Patient home medication includes rosuvastatin 10 mg. -Continue home medication  Acute on macrocytic anemia Hemoglobin on admission 12.6 down to 11.4 overnight.  Patient home medication includes ferrous sulfate 325.  MCV 101.9 on admission. -f/u B12 - f/u iron and TIBC  Type 2 diabetes Patient home medication includes metformin and glimepiride 2 mg as needed. -Sensitive sliding scale  BPH Patient has a history of enlarged prostate.  Enlarged prostate noted on CT abdomen pelvis.  Patient home medication includes Flomax 0.4mg . -Continue home medication  FEN/GI: clear liquids , NPO @ MN  Prophylaxis: SCDs due to active bleed   Disposition: med-surg  History of Present Illness:  Derrick Thornton is a 74 y.o. male presenting with  four days of melena/hematochezia. Patient reports  episodes of dark nonmelanotic stools that look different than his normal diverticulosis associated stools.  Patient reports this is his fourth diverticular bleed. The last one was in Sept/Oct of 2020 headaches that there episodes are about a year apart.  Patient reports bleeding started on Thursday and noted blood in stool was darker than normal last time. Patient also noted that the stool was not runny but not a full formed stool.  Patient reports some LLQ tenderness and "pinprick" pains in his abdomen. Patient was eating and drinking normally prior to presentation to the ED. Denies any fever. Reports vaccination against COVID-19. Patient reports he has been using diclofenac for chronic back pain and prednisone intermittently and has not been taking omeprazole on regular basis.  Patient reports that he was prescribed prednisone approximately 2 weeks ago by PCP for fatigue and reported  taking 3 to 4 days worth of the prescribed 7-day course.Patient denies any other NSAID or ASA use.  Patient reports taking zembright mood enhancer (for 1 year) b6, folate,  b12, beet powder(aking for one year, last used 1 week ago ) , ashwaggandha root (indian ginseng), sceletium tortuosum in addition to describe medications: Cialis as needed, rosuvastatin, metformin,glimepiride, and Protonix. 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    Social : used THC 1 week ago, smokes THC twice per week, denies cocaine, meth, denies tobacco use, occasionally (drinks 3-4 x per month) drinks beer or wine, vodka  ED course Standard labs.  CT abdomen pelvis.  Rectal exam found no external hemorrhoids with questionable scarring to the anterior anal area and a small amount of solid black grossly heme positive in the rectal vault.  Prostate was enlarged on rectal exam.  Received NS bolus 500 and started normal saline mIVF 75 mL/h.  Review Of Systems: Per HPI with the following additions:   Review of Systems  Constitutional: Negative for activity change and fever.  HENT: Negative for congestion and rhinorrhea.   Respiratory: Negative for cough and shortness of breath.   Cardiovascular: Negative for chest pain and leg swelling.  Gastrointestinal: Positive for abdominal pain and blood in stool. Negative for nausea and vomiting.  Genitourinary: Negative for hematuria.     Patient Active Problem List   Diagnosis Date Noted  . GI bleed 06/16/2019  . Acute GI bleeding 06/15/2019  . Unilateral primary osteoarthritis, right knee 11/08/2018  . Chronic pain of right knee 11/08/2018  . Lower GI bleed 05/05/2018  . Acute blood loss anemia 05/05/2018  . HTN (hypertension) 05/05/2018  . Type 2 diabetes mellitus ([facility]) 05/05/2018  . Internal hemorrhoids 05/05/2018  . Diverticulosis 05/05/2018    Past Medical History: Past Medical History:  Diagnosis Date  . Anemia   . Aortic atherosclerosis ([facility])   . Barrett's esophagus   . DDD (degenerative disc disease), lumbar   . Diabetes mellitus without  complication ([facility])   . Diverticulitis   . Diverticulosis   . GI bleed   . Hiatal hernia   . Hyperplastic colon polyp   . Hypertension   . Internal hemorrhoids   . Nephrolithiasis     Past Surgical History: Past Surgical History:  Procedure Laterality Date  . ACHILLES TENDON REPAIR    . KNEE ARTHROSCOPY      Social History: Social History   Tobacco Use  . Smoking status: Never Smoker  . Smokeless tobacco: Never Used  Vaping Use  . Vaping Use: Never used  Substance Use Topics  . Alcohol use: Yes    Comment: occ  . Drug use: Never   Family History: Family History  Problem Relation Age of Onset  . Stroke Father   . Colon cancer Neg Hx   . Stomach cancer Neg Hx   . Pancreatic cancer Neg Hx   . Esophageal cancer Neg Hx     Allergies and Medications: No Known Allergies No current facility-administered medications on file prior to encounter.   Current Outpatient Medications on File Prior to Encounter  Medication Sig Dispense Refill  . ferrous sulfate 325 (65 FE) MG tablet Take 325 mg by mouth daily as needed (anemia).     . Garlic 1000 MG CAPS Take 2,000 mg by mouth daily.    Marland Kitchen glimepiride (AMARYL) 4 MG tablet Take 2 mg  by mouth daily as needed (for high sugar).     Marland Kitchen losartan (COZAAR) 50 MG tablet Take 50 mg by mouth daily. Pt was supposed to start 1/15, but has not taken 1st dose    . Misc Natural Products (PROSTATE HEALTH PO) Take 2 tablets by mouth daily.    Marland Kitchen omeprazole (PRILOSEC) 40 MG capsule Take 40 mg by mouth daily.    Marland Kitchen OVER THE COUNTER MEDICATION Take 1 tablet by mouth daily. Zembright Mood Plus    . OVER THE COUNTER MEDICATION Take 1 capsule by mouth daily. BEET POWDER    . rosuvastatin (CRESTOR) 10 MG tablet Take 10 mg by mouth daily.    . tadalafil (CIALIS) 5 MG tablet Take 5 mg by mouth daily as needed for erectile dysfunction.     . tamsulosin (FLOMAX) 0.4 MG CAPS capsule Take 0.4 mg by mouth daily.    . Vitamin D, Cholecalciferol, 10 MCG (400 UNIT)  CAPS Take 400 Units by mouth daily as needed (pt prefer).      Objective: BP 130/89   Pulse 73   Temp 98.4 F (36.9 C) (Oral)   Resp 12   SpO2 96%   Exam: General: AAO x4, NAD, appropriate affect and mood, appears stated age Eyes: EOM intact, conjunctiva  appropriately erythematous ENTM: MMM Neck: No cervical lymphadenopathy Cardiovascular: RRR, no murmur detected Respiratory: Clear and equal breath sounds bilaterally, appropriate work of breathing, good chest expansion Gastrointestinal: LLQ pain and left-sided periumbilical pain increased with palpation, hyperactive bowel sounds MSK: 5/5 BUE, 5/5 bilateral grip strength, 5/5 BLE   Labs and Imaging: CBC BMET  Recent Labs  Lab 01/20/20 1107  WBC 7.6  HGB 11.6*  HCT 36.8*  PLT 262   Recent Labs  Lab 01/19/20 1946  NA 140  K 3.9  CL 105  CO2 27  BUN 18  CREATININE 1.07  GLUCOSE 104*  CALCIUM 9.4     EKG: Sinus rhythm  CT ABDOMEN PELVIS W CONTRAST  Result Date: 01/20/2020 CLINICAL DATA:  74 year old male with abdominal pain and rectal bleeding for 3 days. EXAM: CT ABDOMEN AND PELVIS WITH CONTRAST TECHNIQUE: Multidetector CT imaging of the abdomen and pelvis was performed using the standard protocol following bolus administration of intravenous contrast. CONTRAST:  OMNIPAQUE IOHEXOL 300 MG/ML  SOLN COMPARISON:  06/15/2019 CT and prior studies FINDINGS: Lower chest: No acute abnormality. Hepatobiliary: The liver and gallbladder are unremarkable. No biliary dilatation. Pancreas: Unremarkable Spleen: Unremarkable Adrenals/Urinary Tract: Nonobstructing LEFT LOWER pole renal calculi are again identified, the largest measuring 1 cm. LEFT renal cysts are again noted. Mild bilateral renal cortical thinning is noted. No other renal abnormalities are present. The adrenal glands and bladder are unremarkable. Stomach/Bowel: A small hiatal hernia is noted. Diffuse colonic diverticulosis noted without evidence of acute  diverticulitis. There is no evidence of bowel obstruction, bowel wall thickening or inflammatory changes. The appendix is normal. Vascular/Lymphatic: No significant vascular findings are present. No enlarged abdominal or pelvic lymph nodes. Reproductive: Prostate enlargement is again noted. Other: No ascites, pneumoperitoneum or focal collection. Musculoskeletal: No acute or suspicious bony abnormalities are identified. 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 IMPRESSION: 1. No evidence of acute abnormality. 2. Diffuse colonic diverticulosis without evidence of acute diverticulitis. 3. Nonobstructing LEFT renal calculi. 4. Small hiatal hernia. 5. Prostate enlargement. Electronically Signed   By: Harmon Pier M.D.   On: 01/20/2020 12:05    Eliseo Gum, MD  01/20/20 PGY-1, [facility] FPTS Intern pager: [phone], text pages welcome   FPTS Upper-Level Resident Addendum   I have independently interviewed and examined the patient. I have discussed the above with the original author and agree with their documentation. Please see also any attending notes.   Ronnald Ramp, MD PGY-2, [facility] 01/20/2020 5:37 PM  FPTS Service pager: [phone] (text pages welcome through [facility])

## 2020-01-20 NOTE — ED Provider Notes (Signed)
[facility] EMERGENCY DEPARTMENT Provider Note   CSN: [id number] Arrival date & time: 01/19/20  1844     History Chief Complaint  Patient presents with  . Rectal Bleeding    Derrick Thornton is a 74 y.o. male.  The history is provided by the patient.  Rectal Bleeding Quality:  Black and tarry, bright red and maroon Amount:  Moderate Duration:  2 days Timing:  Intermittent Context: spontaneously   Similar prior episodes: yes   Relieved by:  Nothing Associated symptoms: abdominal pain (epigastric and LLQ)   Associated symptoms: no fever and no vomiting   Risk factors: no anticoagulant use   Risk factors comment:  Hx of diverticular bleed but states having darky stools this time, however, had large bloody BM while in waiting roomover night      Past Medical History:  Diagnosis Date  . Anemia   . Aortic atherosclerosis (HCC)   . Barrett's esophagus   . DDD (degenerative disc disease), lumbar   . Diabetes mellitus without complication (HCC)   . Diverticulitis   . Diverticulosis   . GI bleed   . Hiatal hernia   . Hyperplastic colon polyp   . Hypertension   . Internal hemorrhoids   . Nephrolithiasis     Patient Active Problem List   Diagnosis Date Noted  . GI bleed 06/16/2019  . Acute GI bleeding 06/15/2019  . Unilateral primary osteoarthritis, right knee 11/08/2018  . Chronic pain of right knee 11/08/2018  . Lower GI bleed 05/05/2018  . Acute blood loss anemia 05/05/2018  . HTN (hypertension) 05/05/2018  . Type 2 diabetes mellitus (HCC) 05/05/2018  . Internal hemorrhoids 05/05/2018  . Diverticulosis 05/05/2018    Past Surgical History:  Procedure Laterality Date  . ACHILLES TENDON REPAIR    . KNEE ARTHROSCOPY         Family History  Problem Relation Age of Onset  . Stroke Father   . Colon cancer Neg Hx   . Stomach cancer Neg Hx   . Pancreatic cancer Neg Hx   . Esophageal cancer Neg Hx     Social History   Tobacco Use  .  Smoking status: Never Smoker  . Smokeless tobacco: Never Used  Vaping Use  . Vaping Use: Never used  Substance Use Topics  . Alcohol use: Yes    Comment: occ  . Drug use: Never    Home Medications Prior to Admission medications   Medication Sig Start Date End Date Taking? Authorizing Provider  ferrous sulfate 325 (65 FE) MG tablet Take 325 mg by mouth daily as needed (anemia).  10/04/17   [provider]  Garlic 1000 MG CAPS Take 2,000 mg by mouth daily.    [provider]  glimepiride (AMARYL) 4 MG tablet Take 2 mg by mouth daily as needed (for high sugar).     [provider]  losartan (COZAAR) 50 MG tablet Take 50 mg by mouth daily. Pt was supposed to start 1/15, but has not taken 1st dose    [provider]  Misc Natural Products (PROSTATE HEALTH PO) Take 2 tablets by mouth daily.    [provider]  omeprazole (PRILOSEC) 40 MG capsule Take 40 mg by mouth daily.    [provider]  OVER THE COUNTER MEDICATION Take 1 tablet by mouth daily. Zembright Mood Plus    [provider]  OVER THE COUNTER MEDICATION Take 1 capsule by mouth daily. BEET POWDER  [provider]  rosuvastatin (CRESTOR) 10 MG tablet Take 10 mg by mouth daily.    [provider]  tadalafil (CIALIS) 5 MG tablet Take 5 mg by mouth daily as needed for erectile dysfunction.  08/20/15   [provider]  tamsulosin (FLOMAX) 0.4 MG CAPS capsule Take 0.4 mg by mouth daily.    [provider]  Vitamin D, Cholecalciferol, 10 MCG (400 UNIT) CAPS Take 400 Units by mouth daily as needed (pt prefer).    [provider]    Allergies    Patient has no known allergies.  Review of Systems   Review of Systems  Constitutional: Negative for chills and fever.  HENT: Negative for ear pain and sore throat.   Eyes: Negative for pain and visual disturbance.  Respiratory: Negative for cough and shortness of breath.     Cardiovascular: Negative for chest pain and palpitations.  Gastrointestinal: Positive for abdominal pain (epigastric and LLQ), blood in stool and hematochezia. Negative for abdominal distention, constipation, diarrhea, nausea, rectal pain and vomiting.  Genitourinary: Negative for dysuria and hematuria.  Musculoskeletal: Negative for arthralgias and back pain.  Skin: Negative for color change and rash.  Neurological: Negative for seizures and syncope.  All other systems reviewed and are negative.   Physical Exam Updated Vital Signs BP 130/89   Pulse 73   Temp 98.4 F (36.9 C) (Oral)   Resp 12   SpO2 96%   Physical Exam Vitals and nursing note reviewed.  Constitutional:      General: He is not in acute distress.    Appearance: He is well-developed. He is not ill-appearing.  HENT:     Head: Normocephalic and atraumatic.     Nose: Nose normal.     Mouth/Throat:     Mouth: Mucous membranes are moist.  Eyes:     Extraocular Movements: Extraocular movements intact.     Conjunctiva/sclera: Conjunctivae normal.     Pupils: Pupils are equal, round, and reactive to light.  Cardiovascular:     Rate and Rhythm: Normal rate and regular rhythm.     Pulses: Normal pulses.     Heart sounds: Normal heart sounds. No murmur heard.   Pulmonary:     Effort: Pulmonary effort is normal. No respiratory distress.     Breath sounds: Normal breath sounds.  Abdominal:     Palpations: Abdomen is soft.     Tenderness: There is no abdominal tenderness.  Genitourinary:    Rectum: Guaiac result positive (dark/maroon/melena type stool on exam).  Musculoskeletal:     Cervical back: Neck supple.  Skin:    General: Skin is warm and dry.     Capillary Refill: Capillary refill takes less than 2 seconds.  Neurological:     General: No focal deficit present.     Mental Status: He is alert.     ED Results / Procedures / Treatments   Labs (all labs ordered are listed, but only abnormal results are  displayed) Labs Reviewed  COMPREHENSIVE METABOLIC PANEL - Abnormal; Notable for the following components:      Result Value   Glucose, Bld 104 (*)    All other components within normal limits  CBC - Abnormal; Notable for the following components:   RBC 3.76 (*)    Hemoglobin 12.4 (*)    HCT 38.7 (*)    MCV 102.9 (*)    All other components within normal limits  CBC - Abnormal; Notable for the following components:  RBC 3.61 (*)    Hemoglobin 11.6 (*)    HCT 36.8 (*)    MCV 101.9 (*)    All other components within normal limits  POC OCCULT BLOOD, ED - Abnormal; Notable for the following components:   Fecal Occult Bld POSITIVE (*)    All other components within normal limits  SARS CORONAVIRUS 2 BY RT PCR (HOSPITAL ORDER, PERFORMED IN [facility])  TYPE AND SCREEN    EKG None  Radiology CT ABDOMEN PELVIS W CONTRAST  Result Date: 01/20/2020 CLINICAL DATA:  74 year old male with abdominal pain and rectal bleeding for 3 days. EXAM: CT ABDOMEN AND PELVIS WITH CONTRAST TECHNIQUE: Multidetector CT imaging of the abdomen and pelvis was performed using the standard protocol following bolus administration of intravenous contrast. CONTRAST:  OMNIPAQUE IOHEXOL 300 MG/ML  SOLN COMPARISON:  06/15/2019 CT and prior studies FINDINGS: Lower chest: No acute abnormality. Hepatobiliary: The liver and gallbladder are unremarkable. No biliary dilatation. Pancreas: Unremarkable Spleen: Unremarkable Adrenals/Urinary Tract: Nonobstructing LEFT LOWER pole renal calculi are again identified, the largest measuring 1 cm. LEFT renal cysts are again noted. Mild bilateral renal cortical thinning is noted. No other renal abnormalities are present. The adrenal glands and bladder are unremarkable. Stomach/Bowel: A small hiatal hernia is noted. Diffuse colonic diverticulosis noted without evidence of acute diverticulitis. There is no evidence of bowel obstruction, bowel wall thickening or inflammatory  changes. The appendix is normal. Vascular/Lymphatic: No significant vascular findings are present. No enlarged abdominal or pelvic lymph nodes. Reproductive: Prostate enlargement is again noted. Other: No ascites, pneumoperitoneum or focal collection. Musculoskeletal: No acute or suspicious bony abnormalities are identified. 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 IMPRESSION: 1. No evidence of acute abnormality. 2. Diffuse colonic diverticulosis without evidence of acute diverticulitis. 3. Nonobstructing LEFT renal calculi. 4. Small hiatal hernia. 5. Prostate enlargement. Electronically Signed   By: Harmon Pier M.D.   On: 01/20/2020 12:05    Procedures Procedures (including critical care time)  Medications Ordered in ED Medications  pantoprazole (PROTONIX) injection 40 mg (40 mg Intravenous Given 01/20/20 1108)  iohexol (OMNIPAQUE) 300 MG/ML solution 100 mL (100 mLs Intravenous Contrast Given 01/20/20 1132)    ED Course  I have reviewed the triage vital signs and the nursing notes.  Pertinent labs & imaging results that were available during my care of the patient were reviewed by me and considered in my medical decision making (see chart for details).    MDM Rules/Calculators/A&P                          Derrick Thornton is a 74 year old male with history of diabetes, diverticulosis, diverticular bleed not on anticoagulation who presents to the ED with rectal bleeding.  Patient with normal vitals.  No fever.  Has had dark stools over the last 2 days.  Had large bloody bowel movement while in the waiting room overnight.  Appears to be melanotic on exam.  He is having some epigastric pain as well as left lower quadrant pain.  Concern for possibly upper GI bleed given the darker stools but does have a history of diverticular bleed.  Initial hemoglobin is 12.4.  Has been waiting for 12+ hours in the waiting room and repeat CBC was done upon arrival to the  room and shows a hemoglobin 11.6.  Do not have a prior to compare to as patient last hemoglobins were from when he  was admitted for GI bleed and was discharged hemoglobin of 10 but that was about 8 months ago.  Otherwise patient has no significant leukocytosis, electrolyte abnormality.  Hemoccult was positive.  Talked with [facility] and they recommend admission to medicine.  We will get a CT scan of the abdomen pelvis for further evaluation as well.  They will evaluate the patient to give further recommendations.  Patient was given IV Protonix.  Will hold on transfusion at this time given that patient is hemodynamically stable.  CT scan of the abdomen and pelvis is overall unremarkable.  Given his history of GI bleeds in the past will admit for further care.  GI to evaluate the patient in ED.  This chart was dictated using voice recognition software.  Despite best efforts to proofread,  errors can occur which can change the documentation meaning.    Final Clinical Impression(s) / ED Diagnoses Final diagnoses:  Acute GI bleeding    Rx / DC Orders ED Discharge Orders    None       Virgina Norfolk, DO 01/20/20 1212

## 2020-01-20 NOTE — ED Notes (Addendum)
Pt states when he went to the [facility] he had bloody stool. Pt states it was about a pint of dark red blood.

## 2020-01-20 NOTE — Plan of Care (Signed)

## 2020-01-21 ENCOUNTER — Telehealth: Payer: Self-pay | Admitting: Gastroenterology

## 2020-01-21 DIAGNOSIS — Z20822 Contact with and (suspected) exposure to covid-19: Secondary | ICD-10-CM | POA: Diagnosis present

## 2020-01-21 DIAGNOSIS — E785 Hyperlipidemia, unspecified: Secondary | ICD-10-CM | POA: Diagnosis present

## 2020-01-21 DIAGNOSIS — D62 Acute posthemorrhagic anemia: Secondary | ICD-10-CM | POA: Diagnosis present

## 2020-01-21 DIAGNOSIS — K922 Gastrointestinal hemorrhage, unspecified: Secondary | ICD-10-CM

## 2020-01-21 DIAGNOSIS — Z8719 Personal history of other diseases of the digestive system: Secondary | ICD-10-CM | POA: Diagnosis not present

## 2020-01-21 DIAGNOSIS — K2270 Barrett's esophagus without dysplasia: Secondary | ICD-10-CM | POA: Diagnosis present

## 2020-01-21 DIAGNOSIS — I10 Essential (primary) hypertension: Secondary | ICD-10-CM | POA: Diagnosis present

## 2020-01-21 DIAGNOSIS — D509 Iron deficiency anemia, unspecified: Secondary | ICD-10-CM | POA: Diagnosis not present

## 2020-01-21 DIAGNOSIS — E119 Type 2 diabetes mellitus without complications: Secondary | ICD-10-CM

## 2020-01-21 DIAGNOSIS — K449 Diaphragmatic hernia without obstruction or gangrene: Secondary | ICD-10-CM | POA: Diagnosis present

## 2020-01-21 DIAGNOSIS — E876 Hypokalemia: Secondary | ICD-10-CM | POA: Diagnosis not present

## 2020-01-21 DIAGNOSIS — I700 Atherosclerosis of aorta: Secondary | ICD-10-CM | POA: Diagnosis present

## 2020-01-21 DIAGNOSIS — Z823 Family history of stroke: Secondary | ICD-10-CM | POA: Diagnosis not present

## 2020-01-21 DIAGNOSIS — Z79899 Other long term (current) drug therapy: Secondary | ICD-10-CM | POA: Diagnosis not present

## 2020-01-21 DIAGNOSIS — G8929 Other chronic pain: Secondary | ICD-10-CM | POA: Diagnosis present

## 2020-01-21 DIAGNOSIS — N200 Calculus of kidney: Secondary | ICD-10-CM | POA: Diagnosis present

## 2020-01-21 DIAGNOSIS — N400 Enlarged prostate without lower urinary tract symptoms: Secondary | ICD-10-CM | POA: Diagnosis present

## 2020-01-21 DIAGNOSIS — K921 Melena: Secondary | ICD-10-CM | POA: Diagnosis not present

## 2020-01-21 DIAGNOSIS — M549 Dorsalgia, unspecified: Secondary | ICD-10-CM | POA: Diagnosis present

## 2020-01-21 DIAGNOSIS — K219 Gastro-esophageal reflux disease without esophagitis: Secondary | ICD-10-CM | POA: Diagnosis present

## 2020-01-21 DIAGNOSIS — K5731 Diverticulosis of large intestine without perforation or abscess with bleeding: Secondary | ICD-10-CM | POA: Diagnosis present

## 2020-01-21 DIAGNOSIS — Z7984 Long term (current) use of oral hypoglycemic drugs: Secondary | ICD-10-CM | POA: Diagnosis not present

## 2020-01-21 LAB — GLUCOSE, CAPILLARY
Glucose-Capillary: 107 mg/dL — ABNORMAL HIGH (ref 70–99)
Glucose-Capillary: 151 mg/dL — ABNORMAL HIGH (ref 70–99)
Glucose-Capillary: 90 mg/dL (ref 70–99)
Glucose-Capillary: 157 mg/dL — ABNORMAL HIGH (ref 70–99)

## 2020-01-21 LAB — CBC
HCT: 28.3 % — ABNORMAL LOW (ref 39.0–52.0)
Hemoglobin: 8.9 g/dL — ABNORMAL LOW (ref 13.0–17.0)
MCH: 32.7 pg (ref 26.0–34.0)
MCHC: 31.4 g/dL (ref 30.0–36.0)
MCV: 104 fL — ABNORMAL HIGH (ref 80.0–100.0)
Platelets: 214 10*3/uL (ref 150–400)
RBC: 2.72 MIL/uL — ABNORMAL LOW (ref 4.22–5.81)
RDW: 12 % (ref 11.5–15.5)
WBC: 5.8 10*3/uL (ref 4.0–10.5)
nRBC: 0 % (ref 0.0–0.2)

## 2020-01-21 LAB — COMPREHENSIVE METABOLIC PANEL
ALT: 22 U/L (ref 0–44)
AST: 19 U/L (ref 15–41)
Albumin: 3.1 g/dL — ABNORMAL LOW (ref 3.5–5.0)
Alkaline Phosphatase: 48 U/L (ref 38–126)
Anion gap: 11 (ref 5–15)
BUN: 19 mg/dL (ref 8–23)
CO2: 22 mmol/L (ref 22–32)
Calcium: 8.5 mg/dL — ABNORMAL LOW (ref 8.9–10.3)
Chloride: 110 mmol/L (ref 98–111)
Creatinine, Ser: 1.08 mg/dL (ref 0.61–1.24)
GFR calc Af Amer: >60 mL/min (ref >60)
GFR calc non Af Amer: >60 mL/min (ref >60)
Glucose, Bld: 107 mg/dL — ABNORMAL HIGH (ref 70–99)
Potassium: 3.9 mmol/L (ref 3.5–5.1)
Sodium: 143 mmol/L (ref 135–145)
Total Bilirubin: 0.8 mg/dL (ref 0.3–1.2)
Total Protein: 5.5 g/dL — ABNORMAL LOW (ref 6.5–8.1)

## 2020-01-21 LAB — FOLATE: Folate: 23.3 ng/mL (ref >5.9)

## 2020-01-21 MED ORDER — PEG-KCL-NACL-NASULF-NA ASC-C 100 G PO SOLR: 1.0000 | Freq: Once | ORAL | Status: DC

## 2020-01-21 MED ORDER — SODIUM CHLORIDE 0.9 % IV SOLN
510.0000 mg | Freq: Once | INTRAVENOUS | Status: AC
  Administered 2020-01-21: 510 mg via INTRAVENOUS
  Filled 2020-01-21: qty 17

## 2020-01-21 MED ORDER — METOCLOPRAMIDE HCL 5 MG/ML IJ SOLN
10.0000 mg | Freq: Once | INTRAMUSCULAR | Status: AC
  Administered 2020-01-21: 10 mg via INTRAVENOUS

## 2020-01-21 MED ORDER — METOCLOPRAMIDE HCL 5 MG/ML IJ SOLN
10.0000 mg | Freq: Once | INTRAMUSCULAR | Status: DC
  Filled 2020-01-21: qty 2

## 2020-01-21 MED ORDER — MAGNESIUM CITRATE PO SOLN
1.0000 | Freq: Once | ORAL | Status: AC
  Administered 2020-01-21: 1 via ORAL
  Filled 2020-01-21: qty 296

## 2020-01-21 MED ORDER — BISACODYL 5 MG PO TBEC
20.0000 mg | DELAYED_RELEASE_TABLET | Freq: Once | ORAL | Status: AC
  Administered 2020-01-21: 20 mg via ORAL
  Filled 2020-01-21: qty 4

## 2020-01-21 MED ORDER — PEG-KCL-NACL-NASULF-NA ASC-C 100 G PO SOLR
0.5000 | Freq: Once | ORAL | Status: AC
  Administered 2020-01-21: 100 g via ORAL
  Filled 2020-01-21: qty 1

## 2020-01-21 MED ORDER — PEG 3350-KCL-NABCB-NACL-NASULF 236 G PO SOLR
4000.0000 mL | Freq: Once | ORAL | Status: DC
  Filled 2020-01-21: qty 4000

## 2020-01-21 MED ORDER — PEG-KCL-NACL-NASULF-NA ASC-C 100 G PO SOLR
0.5000 | Freq: Once | ORAL | Status: AC
  Administered 2020-01-21: 100 g via ORAL

## 2020-01-21 NOTE — Plan of Care (Signed)
  Problem: Education: Goal: Ability to identify signs and symptoms of gastrointestinal bleeding will improve Outcome: Progressing   Problem: Bowel/Gastric: Goal: Will show no signs and symptoms of gastrointestinal bleeding Outcome: Progressing   

## 2020-01-21 NOTE — Progress Notes (Signed)
Progress Note   Subjective  He notes dark stool and some solid stool with prep. He feels he is not adequately prepped for colonoscopy    Objective  Vital signs in last 24 hours: Temp:  [97.5 F (36.4 C)-98.6 F (37 C)] 98.6 F (37 C) (08/23 0904) Pulse Rate:  [70-77] 70 (08/23 0904) Resp:  [12-18] 18 (08/23 0904) BP: (109-133)/(67-89) 133/73 (08/23 0904) SpO2:  [94 %-98 %] 94 % (08/23 0904) Weight:  [75.9 kg] 75.9 kg (08/22 1700) Last BM Date: 01/20/20  General: Alert, well-developed, in NAD Heart:  Regular rate and rhythm; no murmurs Chest: Clear to ascultation bilaterally Abdomen:  Soft, nontender and nondistended. Normal bowel sounds, without guarding, and without rebound.   Extremities:  Without edema. Neurologic:  Alert and  oriented x4; grossly normal neurologically. Psych:  Alert and cooperative. Normal mood and affect.  Intake/Output from previous day: 08/22 0701 - 08/23 0700 In: 2065.9 [P.O.:240; I.V.:1325.9; IV Piggyback:500] Out: 250 [Urine:250] Intake/Output this shift: No intake/output data recorded.  Lab Results: Recent Labs    01/19/20 1946 01/19/20 1946 01/20/20 1107 01/20/20 1600 01/21/20 0654  WBC 6.0  --  7.6  --  5.8  HGB 12.4*   < > 11.6* 10.6* 8.9*  HCT 38.7*   < > 36.8* 33.8* 28.3*  PLT 279  --  262  --  214   < > = values in this interval not displayed.   BMET Recent Labs    01/19/20 1946 01/21/20 0654  NA 140 143  K 3.9 3.9  CL 105 110  CO2 27 22  GLUCOSE 104* 107*  BUN 18 19  CREATININE 1.07 1.08  CALCIUM 9.4 8.5*   LFT Recent Labs    01/21/20 0654  PROT 5.5*  ALBUMIN 3.1*  AST 19  ALT 22  ALKPHOS 48  BILITOT 0.8    Studies/Results: CT ABDOMEN PELVIS W CONTRAST  Result Date: 01/20/2020 CLINICAL DATA:  74 year old male with abdominal pain and rectal bleeding for 3 days. EXAM: CT ABDOMEN AND PELVIS WITH CONTRAST TECHNIQUE: Multidetector CT imaging of the abdomen and pelvis was performed using the standard  protocol following bolus administration of intravenous contrast. CONTRAST:  OMNIPAQUE IOHEXOL 300 MG/ML  SOLN COMPARISON:  06/15/2019 CT and prior studies FINDINGS: Lower chest: No acute abnormality. Hepatobiliary: The liver and gallbladder are unremarkable. No biliary dilatation. Pancreas: Unremarkable Spleen: Unremarkable Adrenals/Urinary Tract: Nonobstructing LEFT LOWER pole renal calculi are again identified, the largest measuring 1 cm. LEFT renal cysts are again noted. Mild bilateral renal cortical thinning is noted. No other renal abnormalities are present. The adrenal glands and bladder are unremarkable. Stomach/Bowel: A small hiatal hernia is noted. Diffuse colonic diverticulosis noted without evidence of acute diverticulitis. There is no evidence of bowel obstruction, bowel wall thickening or inflammatory changes. The appendix is normal. Vascular/Lymphatic: No significant vascular findings are present. No enlarged abdominal or pelvic lymph nodes. Reproductive: Prostate enlargement is again noted. Other: No ascites, pneumoperitoneum or focal collection. Musculoskeletal: No acute or suspicious bony abnormalities are identified. 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 IMPRESSION: 1. No evidence of acute abnormality. 2. Diffuse colonic diverticulosis without evidence of acute diverticulitis. 3. Nonobstructing LEFT renal calculi. 4. Small hiatal hernia. 5. Prostate enlargement. Electronically Signed   By: Harmon Pier M.D.   On: 01/20/2020 12:05      Assessment & Recommendations   1. Acute GI bleed with hematochezia / melena. Suspected diverticular bleed. R/O ulcer, AVM,  neoplasm, etc. Continue clear liquid diet today, repeat bowel prep this evening and reschedule colonoscopy and EGD for tomorrow with Dr. Danis.   2. ABL and iron deficiency anemia. Hgb has decreased to 8.9. Continue to trend. Avoid oral iron until colonoscopy is completed and GI bleed has  fully resolved. Primary service to consider IV iron replacement.   3. GERD. History of Barrett's. Continue PPI IV bid for now. EGD tomorrow.     LOS: 0 days   Macyn Shropshire T. Tatiyana Foucher MD 01/21/2020, 9:32 AM 

## 2020-01-21 NOTE — H&P (View-Only) (Signed)
Progress Note   Subjective  He notes dark stool and some solid stool with prep. He feels he is not adequately prepped for colonoscopy    Objective  Vital signs in last 24 hours: Temp:  [97.5 F (36.4 C)-98.6 F (37 C)] 98.6 F (37 C) (08/23 0904) Pulse Rate:  [70-77] 70 (08/23 0904) Resp:  [12-18] 18 (08/23 0904) BP: (109-133)/(67-89) 133/73 (08/23 0904) SpO2:  [94 %-98 %] 94 % (08/23 0904) Weight:  [75.9 kg] 75.9 kg (08/22 1700) Last BM Date: 01/20/20  General: Alert, well-developed, in NAD Heart:  Regular rate and rhythm; no murmurs Chest: Clear to ascultation bilaterally Abdomen:  Soft, nontender and nondistended. Normal bowel sounds, without guarding, and without rebound.   Extremities:  Without edema. Neurologic:  Alert and  oriented x4; grossly normal neurologically. Psych:  Alert and cooperative. Normal mood and affect.  Intake/Output from previous day: 08/22 0701 - 08/23 0700 In: 2065.9 [P.O.:240; I.V.:1325.9; IV Piggyback:500] Out: 250 [Urine:250] Intake/Output this shift: No intake/output data recorded.  Lab Results: Recent Labs    01/19/20 1946 01/19/20 1946 01/20/20 1107 01/20/20 1600 01/21/20 0654  WBC 6.0  --  7.6  --  5.8  HGB 12.4*   < > 11.6* 10.6* 8.9*  HCT 38.7*   < > 36.8* 33.8* 28.3*  PLT 279  --  262  --  214   < > = values in this interval not displayed.   BMET Recent Labs    01/19/20 1946 01/21/20 0654  NA 140 143  K 3.9 3.9  CL 105 110  CO2 27 22  GLUCOSE 104* 107*  BUN 18 19  CREATININE 1.07 1.08  CALCIUM 9.4 8.5*   LFT Recent Labs    01/21/20 0654  PROT 5.5*  ALBUMIN 3.1*  AST 19  ALT 22  ALKPHOS 48  BILITOT 0.8    Studies/Results: CT ABDOMEN PELVIS W CONTRAST  Result Date: 01/20/2020 CLINICAL DATA:  74 year old male with abdominal pain and rectal bleeding for 3 days. EXAM: CT ABDOMEN AND PELVIS WITH CONTRAST TECHNIQUE: Multidetector CT imaging of the abdomen and pelvis was performed using the standard  protocol following bolus administration of intravenous contrast. CONTRAST:  OMNIPAQUE IOHEXOL 300 MG/ML  SOLN COMPARISON:  06/15/2019 CT and prior studies FINDINGS: Lower chest: No acute abnormality. Hepatobiliary: The liver and gallbladder are unremarkable. No biliary dilatation. Pancreas: Unremarkable Spleen: Unremarkable Adrenals/Urinary Tract: Nonobstructing LEFT LOWER pole renal calculi are again identified, the largest measuring 1 cm. LEFT renal cysts are again noted. Mild bilateral renal cortical thinning is noted. No other renal abnormalities are present. The adrenal glands and bladder are unremarkable. Stomach/Bowel: A small hiatal hernia is noted. Diffuse colonic diverticulosis noted without evidence of acute diverticulitis. There is no evidence of bowel obstruction, bowel wall thickening or inflammatory changes. The appendix is normal. Vascular/Lymphatic: No significant vascular findings are present. No enlarged abdominal or pelvic lymph nodes. Reproductive: Prostate enlargement is again noted. Other: No ascites, pneumoperitoneum or focal collection. Musculoskeletal: No acute or suspicious bony abnormalities are identified. 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 IMPRESSION: 1. No evidence of acute abnormality. 2. Diffuse colonic diverticulosis without evidence of acute diverticulitis. 3. Nonobstructing LEFT renal calculi. 4. Small hiatal hernia. 5. Prostate enlargement. Electronically Signed   By: Harmon Pier M.D.   On: 01/20/2020 12:05      Assessment & Recommendations   1. Acute GI bleed with hematochezia / melena. Suspected diverticular bleed. R/O ulcer, AVM,  neoplasm, etc. Continue clear liquid diet today, repeat bowel prep this evening and reschedule colonoscopy and EGD for tomorrow with Dr. Myrtie Neither.   2. ABL and iron deficiency anemia. Hgb has decreased to 8.9. Continue to trend. Avoid oral iron until colonoscopy is completed and GI bleed has  fully resolved. Primary service to consider IV iron replacement.   3. GERD. History of Barrett's. Continue PPI IV bid for now. EGD tomorrow.     LOS: 0 days   Jasalyn Frysinger T. Russella Dar MD 01/21/2020, 9:32 AM

## 2020-01-21 NOTE — Progress Notes (Signed)
Re-attempt colonoscopy, EGD set for 9 AM tmrw w Dr Myrtie Neither.  Getting 20 mg po Dulcolax now.  Plan Mag citrate x 1, then repeat movi-prep this PM.  Pt does not like golyelty and would refuse this if ordered.    S Declynn Lopresti PA-C

## 2020-01-21 NOTE — Telephone Encounter (Signed)
Patient is inpatient.  Inpatient team will assess patient.

## 2020-01-21 NOTE — Progress Notes (Signed)
Family Medicine Teaching Service Daily Progress Note Intern Pager: [phone]  Patient name: Derrick Thornton Medical record number: [id number] Date of birth: 1946-02-14 Age: 74 y.o. Gender: male  Primary Care Provider: System, Provider Not In Consultants: GI Code Status: FULL Code  Pt Overview and Major Events to Date:  8/22 admitted  Assessment and Plan: Acute onset hematochezia/melena  history of diverticulosis/diverticulitis Patient feels that he is not adequately prepped for colonoscopy. He finished the prep, but reports that he has not had clear stools. GI saw patient and recommend continue clear liquid diet today, repeat bowel prep this evening and reschedule for EGD and colonoscopy tom. with Dr. Myrtie Neither  HR 75 BP 109/67.  Patient remains afebrile. . Hematochezia. Hgb this morning 8.9 down from 11.6. This this decline was expected. Sodium 143 and potassium 3.9. Albumin 3.1. -GI consulted, appreciate recommendations  -monitor Hgb with daily CBC  -Transfuse for Hgb< 8, per GI -EGD and colonoscopy today with Dr. Russella Dar -Continue NS mIVF 75 mL/h -Continue Protonix IV 40 inpatient  HTN Patient remains normotensive. BP range 109/67-133/75. -We will continue to monitor with vitals  HLD  Patient home medication includes rosuvastatin 10 mg. -Continue home medication  Acute on macrocytic anemia Hemoglobin down to 8.9.  Patient home medication includes ferrous sulfate 325.  MCV 101.9 on admission. B12 536, iron 51, TIBC 381. Ferritin 25. - IV feraheme - f/u folate - avoid PO iron until colonoscopy complete and GI bleed has fully resolved  Type 2 diabetes Patient home medication includes metformin and glimepiride 2 mg as needed. CBG 107. A1c 5.3 at admission, did not require insulin overnight. -Sensitive sliding scale inpatient  BPH Patient has a history of enlarged prostate.  Enlarged prostate noted on CT abdomen pelvis.  Patient home medication includes Flomax 0.4mg . -Continue  home medication  FEN/GI: Clear Liquids, NPO@ [place]  Prophylaxis: SCDs due to active bleed   Disposition: med-surg   Subjective:  No overnight events.  Patient feels that prep is not complete at this time and would like to have an additional day of prep.  Patient feels that his stool is still dark.  Patient otherwise reports no chest pain or shortness of breath.   Objective: Temp:  [97.5 F (36.4 C)-98.2 F (36.8 C)] 97.5 F (36.4 C) (08/23 0521) Pulse Rate:  [71-77] 75 (08/23 0521) Resp:  [12-18] 18 (08/23 0521) BP: (109-133)/(67-89) 109/67 (08/23 0521) SpO2:  [96 %-98 %] 96 % (08/23 0521) Weight:  [75.9 kg] 75.9 kg (08/22 1700) Physical Exam: General:, Appears stated age, appropriate mood and affect, AAO x4 Cardiovascular: RRR, no murmur detected Respiratory: Clear and equal bilaterally, good chest expansion, good work of breathing Abdomen: LLQ in left-sided periumbilical abdominal pain with and without palpation, hyperactive bowel sounds Extremities: Thin, distal pulses intact  Laboratory: Recent Labs  Lab 01/19/20 1946 01/19/20 1946 01/20/20 1107 01/20/20 1600 01/21/20 0654  WBC 6.0  --  7.6  --  5.8  HGB 12.4*   < > 11.6* 10.6* 8.9*  HCT 38.7*   < > 36.8* 33.8* 28.3*  PLT 279  --  262  --  214   < > = values in this interval not displayed.   Recent Labs  Lab 01/19/20 1946 01/21/20 0654  NA 140 143  K 3.9 3.9  CL 105 110  CO2 27 22  BUN 18 19  CREATININE 1.07 1.08  CALCIUM 9.4 8.5*  PROT 7.2 5.5*  BILITOT 0.7 0.8  ALKPHOS 64 48  ALT 27  22  AST 21 19  GLUCOSE 104* 107*     Imaging/Diagnostic Tests: CT ABDOMEN PELVIS W CONTRAST  Result Date: 01/20/2020 CLINICAL DATA:  74 year old male with abdominal pain and rectal bleeding for 3 days. EXAM: CT ABDOMEN AND PELVIS WITH CONTRAST TECHNIQUE: Multidetector CT imaging of the abdomen and pelvis was performed using the standard protocol following bolus administration of intravenous contrast. CONTRAST:   OMNIPAQUE IOHEXOL 300 MG/ML  SOLN COMPARISON:  06/15/2019 CT and prior studies FINDINGS: Lower chest: No acute abnormality. Hepatobiliary: The liver and gallbladder are unremarkable. No biliary dilatation. Pancreas: Unremarkable Spleen: Unremarkable Adrenals/Urinary Tract: Nonobstructing LEFT LOWER pole renal calculi are again identified, the largest measuring 1 cm. LEFT renal cysts are again noted. Mild bilateral renal cortical thinning is noted. No other renal abnormalities are present. The adrenal glands and bladder are unremarkable. Stomach/Bowel: A small hiatal hernia is noted. Diffuse colonic diverticulosis noted without evidence of acute diverticulitis. There is no evidence of bowel obstruction, bowel wall thickening or inflammatory changes. The appendix is normal. Vascular/Lymphatic: No significant vascular findings are present. No enlarged abdominal or pelvic lymph nodes. Reproductive: Prostate enlargement is again noted. Other: No ascites, pneumoperitoneum or focal collection. Musculoskeletal: No acute or suspicious bony abnormalities are identified. 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 IMPRESSION: 1. No evidence of acute abnormality. 2. Diffuse colonic diverticulosis without evidence of acute diverticulitis. 3. Nonobstructing LEFT renal calculi. 4. Small hiatal hernia. 5. Prostate enlargement. Electronically Signed   By: Harmon Pier M.D.   On: 01/20/2020 12:05    Bobbye Morton, MD 01/21/2020, 8:00 AM PGY-1, [facility] Family Medicine FPTS Intern pager: [phone], text pages welcome

## 2020-01-22 ENCOUNTER — Inpatient Hospital Stay (HOSPITAL_COMMUNITY): Payer: Medicare Other

## 2020-01-22 ENCOUNTER — Encounter (HOSPITAL_COMMUNITY)
Admission: EM | Disposition: A | Discharge status: Home / Self Care | Payer: Self-pay | Source: Home / Self Care | Attending: Family Medicine

## 2020-01-22 ENCOUNTER — Inpatient Hospital Stay (HOSPITAL_COMMUNITY): Payer: Medicare Other | Admitting: Anesthesiology

## 2020-01-22 DIAGNOSIS — K921 Melena: Secondary | ICD-10-CM

## 2020-01-22 DIAGNOSIS — K2270 Barrett's esophagus without dysplasia: Secondary | ICD-10-CM

## 2020-01-22 HISTORY — PX: ESOPHAGOGASTRODUODENOSCOPY (EGD) WITH PROPOFOL: SHX5813

## 2020-01-22 HISTORY — PX: BIOPSY: SHX5522

## 2020-01-22 HISTORY — PX: COLONOSCOPY WITH PROPOFOL: SHX5780

## 2020-01-22 LAB — GLUCOSE, CAPILLARY
Glucose-Capillary: 103 mg/dL — ABNORMAL HIGH (ref 70–99)
Glucose-Capillary: 186 mg/dL — ABNORMAL HIGH (ref 70–99)
Glucose-Capillary: 89 mg/dL (ref 70–99)
Glucose-Capillary: 146 mg/dL — ABNORMAL HIGH (ref 70–99)

## 2020-01-22 LAB — CBC
HCT: 20.6 % — ABNORMAL LOW (ref 39.0–52.0)
Hemoglobin: 6.6 g/dL — CL (ref 13.0–17.0)
MCH: 33.5 pg (ref 26.0–34.0)
MCHC: 32 g/dL (ref 30.0–36.0)
MCV: 104.6 fL — ABNORMAL HIGH (ref 80.0–100.0)
Platelets: 188 10*3/uL (ref 150–400)
RBC: 1.97 MIL/uL — ABNORMAL LOW (ref 4.22–5.81)
RDW: 12.5 % (ref 11.5–15.5)
WBC: 9.8 10*3/uL (ref 4.0–10.5)
nRBC: 0 % (ref 0.0–0.2)

## 2020-01-22 LAB — PREPARE RBC (CROSSMATCH)

## 2020-01-22 SURGERY — ESOPHAGOGASTRODUODENOSCOPY (EGD) WITH PROPOFOL
Anesthesia: Monitor Anesthesia Care

## 2020-01-22 MED ORDER — PANTOPRAZOLE SODIUM 40 MG IV SOLR
40.0000 mg | Freq: Two times a day (BID) | INTRAVENOUS | Status: DC
  Administered 2020-01-22 (×2): 40 mg via INTRAVENOUS
  Filled 2020-01-22 (×3): qty 40

## 2020-01-22 MED ORDER — ONDANSETRON HCL 4 MG/2ML IJ SOLN
INTRAMUSCULAR | Status: DC | PRN
  Administered 2020-01-22: 4 mg via INTRAVENOUS

## 2020-01-22 MED ORDER — PHENYLEPHRINE HCL (PRESSORS) 10 MG/ML IV SOLN
INTRAVENOUS | Status: DC | PRN
  Administered 2020-01-22: 80 ug via INTRAVENOUS
  Administered 2020-01-22 (×4): 40 ug via INTRAVENOUS

## 2020-01-22 MED ORDER — IOHEXOL 350 MG/ML SOLN
100.0000 mL | Freq: Once | INTRAVENOUS | Status: AC | PRN
  Administered 2020-01-22: 100 mL via INTRAVENOUS

## 2020-01-22 MED ORDER — LIDOCAINE HCL (CARDIAC) PF 100 MG/5ML IV SOSY
PREFILLED_SYRINGE | INTRAVENOUS | Status: DC | PRN
  Administered 2020-01-22: 40 mg via INTRATRACHEAL

## 2020-01-22 MED ORDER — LACTATED RINGERS IV SOLN: INTRAVENOUS | Status: DC

## 2020-01-22 MED ORDER — ACETAMINOPHEN 325 MG PO TABS
650.0000 mg | ORAL_TABLET | Freq: Once | ORAL | Status: AC
  Administered 2020-01-22: 650 mg via ORAL
  Filled 2020-01-22: qty 2

## 2020-01-22 MED ORDER — PROPOFOL 500 MG/50ML IV EMUL
INTRAVENOUS | Status: DC | PRN
  Administered 2020-01-22: 125 ug/kg/min via INTRAVENOUS

## 2020-01-22 SURGICAL SUPPLY — 25 items

## 2020-01-22 NOTE — Progress Notes (Signed)
Family Medicine Teaching Service Daily Progress Note Intern Pager: [phone]  Patient name: Derrick Thornton Medical record number: [id number] Date of birth: 1945/10/22 Age: 74 y.o. Gender: male  Primary Care Provider: System, Provider Not In Consultants: GI Code Status: Full  Pt Overview and Major Events to Date:  8/22 admitted  Assessment and Plan: Acute onset hematochezia/melena  history of diverticulosis/diverticulitis Patient received EGD and colonoscopy this AM.  -GI consulted, appreciate recommendations  -monitor Hgb with daily CBC -Transfuse for Hgb< 8,per GI -EGD and colonoscopy today with Dr. Myrtie Neither -disontinue NSmIVF 75 mL/h unless otherwise suggested by GI -Continue Protonix IV 40 inpatient  HTN Patient remains normotensive.BP range 116/75-139/90. -We will continue to monitorwith vitals  HLD Patient home medication includes rosuvastatin 10 mg. -Continue home medication  Acute on macrocyticanemia Patient home medication includes ferrous sulfate 325.Folate 23.3. Patient received IV feraheme 8/23. - f/u folate - avoid PO iron until colonoscopy complete and GI bleed has fully resolved  Type 2 diabetes Patient home medication includesmetformin andglimepiride2mg  as needed. CBG 103. A1c 5.3 at admission, did not require insulin overnight. -Sensitive sliding scale inpatient  BPH Patient has a history of enlarged prostate. Enlarged prostate noted on CT abdomen pelvis. Patient home medication includes Flomax 0.4mg . -Continue home medication  FEN/GI:Patient was NPO @[place]  for procedure  Prophylaxis:SCDs due to active bleed  Disposition:med-surg  Subjective:  No overnight events. Patient in GI lab will see when he comes back and drop another note then.  Objective: Temp:  [97.5 F (36.4 C)-98.7 F (37.1 C)] 98.2 F (36.8 C) (08/24 0704) Pulse Rate:  [70-109] 82 (08/24 0704) Resp:  [13-18] 13 (08/24 0704) BP: (116-139)/(68-92) 133/68  (08/24 0704) SpO2:  [94 %-98 %] 98 % (08/24 0704) Weight:  [76.8 kg] 76.8 kg (08/23 2041) Physical Exam: General: Deferred until patient back from GI lab Cardiovascular: Deferred Respiratory: Deferred Abdomen: Deferred Extremities: Deferred  Laboratory: Recent Labs  Lab 01/19/20 1946 01/19/20 1946 01/20/20 1107 01/20/20 1600 01/21/20 0654  WBC 6.0  --  7.6  --  5.8  HGB 12.4*   < > 11.6* 10.6* 8.9*  HCT 38.7*   < > 36.8* 33.8* 28.3*  PLT 279  --  262  --  214   < > = values in this interval not displayed.   Recent Labs  Lab 01/19/20 1946 01/21/20 0654  NA 140 143  K 3.9 3.9  CL 105 110  CO2 27 22  BUN 18 19  CREATININE 1.07 1.08  CALCIUM 9.4 8.5*  PROT 7.2 5.5*  BILITOT 0.7 0.8  ALKPHOS 64 48  ALT 27 22  AST 21 19  GLUCOSE 104* 107*      Imaging/Diagnostic Tests: No results found. None new.  01/23/20, MD 01/22/2020, 7:54 AM PGY-1, [facility] Family Medicine FPTS Intern pager: [phone], text pages welcome

## 2020-01-22 NOTE — Hospital Course (Addendum)
Mr. Derrick Thornton is a 74 year old male who presented with acute onset hematochezia/melena with a PMH significant for diverticulosis, diverticulitis, HTN, HLD, T2DM, BPH.  Acute onset hematochezia/melena  history of diverticulosis/diverticulitis FOBT positive at admission GI was consulted.  EGD and colonoscopy scheduled.  Patient required 2-day prep.  Although he finished prep on the first day he did not feel that his bowels had cleared enough.  After 2 days colonoscopy visual was fair blood was noted from splenic flexure down but could not rule out active bleed.  EGD was only significant for small section of Barrett's esophagus.  GI recommended CTA abdomen pelvis.  CT abdomen pelvis was negative for abdominal hemorrhage.  GI concluded patient was likely suffering from intermittent bleed from a diverticula.  Patient had a few bloody BMs after colonoscopy however suspected to be old clots and blood.  BMs eventually stopped patient continued to have flatulence. Patient hemoglobin remained stable.  HTN Patient remained normotensive throughout hospitalization and did not require medication.  HLD  Patient home medication regimen was rosuvastatin 10 mg.  Continue this inpatient  Acute onset macrocytic anemia Patient MCV at admission 102.  Hgb at admission was 12.4.  Post EGD and colonoscopy patient Hgb 6.6.  Patient received 2U pRBCs.  Hemoglobin was trended and stabilized around 7.8.  Per GI recommendations patient p.o. iron home medication was held at admission due to GI bleed.  Patient received one-time IV iron prior to discharge.  T2 DM Patient home medication regimen including metformin and glimepiride 2 mg as needed.  A1c 5.3 at admission.  Patient was placed on sensitive sliding scale inpatient.    BPH Patient home medication included Flomax 0.4 mg patient received this during his stay in inpatient.  CT abdomen pelvis noted enlarged prostate.

## 2020-01-22 NOTE — Anesthesia Postprocedure Evaluation (Signed)
Anesthesia Post Note  Patient: Avis Epley  Procedure(s) Performed: ESOPHAGOGASTRODUODENOSCOPY (EGD) WITH PROPOFOL (N/A ) COLONOSCOPY WITH PROPOFOL (N/A ) BIOPSY     Patient location during evaluation: PACU Anesthesia Type: MAC Level of consciousness: awake and alert Pain management: pain level controlled Vital Signs Assessment: post-procedure vital signs reviewed and stable Respiratory status: spontaneous breathing, nonlabored ventilation, respiratory function stable and patient connected to nasal cannula oxygen Cardiovascular status: stable and blood pressure returned to baseline Postop Assessment: no apparent nausea or vomiting Anesthetic complications: no   No complications documented.  Last Vitals:  Vitals:   01/22/20 0850 01/22/20 0900  BP: (!) 104/57 116/60  Pulse: (!) 50 85  Resp: 18 19  Temp:    SpO2: 90% 100%    Last Pain:  Vitals:   01/22/20 0900  TempSrc:   PainSc: 0-No pain                 Merlinda Frederick

## 2020-01-22 NOTE — Progress Notes (Signed)
egd and colonoscopy reports done. No source of bleeding on EGD Blood and clots with extensive diverticulosis in colon.  May be ongoing bleeding - visualization limited.  CBC and CT Angiogram ordered.  To IR if CTA positive. Suspected diverticular bleeding.

## 2020-01-22 NOTE — Transfer of Care (Signed)
Immediate Anesthesia Transfer of Care Note  Patient: Derrick Thornton  Procedure(s) Performed: ESOPHAGOGASTRODUODENOSCOPY (EGD) WITH PROPOFOL (N/A ) COLONOSCOPY WITH PROPOFOL (N/A ) BIOPSY  Patient Location: Endoscopy Unit  Anesthesia Type:MAC  Level of Consciousness: sedated  Airway & Oxygen Therapy: Patient connected to nasal cannula oxygen  Post-op Assessment: Post -op Vital signs reviewed and stable  Post vital signs: stable  Last Vitals:  Vitals Value Taken Time  BP    Temp    Pulse    Resp    SpO2      Last Pain:  Vitals:   01/22/20 0704  TempSrc: Oral  PainSc: 0-No pain         Complications: No complications documented.

## 2020-01-22 NOTE — Interval H&P Note (Signed)
History and Physical Interval Note:  01/22/2020 7:47 AM  Derrick Thornton  has presented today for surgery, with the diagnosis of GI bleed, melena, bright red rectal bleeding.  The various methods of treatment have been discussed with the patient and family. After consideration of risks, benefits and other options for treatment, the patient has consented to  Procedure(s): ESOPHAGOGASTRODUODENOSCOPY (EGD) WITH PROPOFOL (N/A) COLONOSCOPY WITH PROPOFOL (N/A) as a surgical intervention.  The patient's history has been reviewed, patient examined, no change in status, stable for surgery.  I have reviewed the patient's chart and labs.  Questions were answered to the patient's satisfaction.    I rec'd signout from Dr Rhea Belton on this patient and spoke with Dr Russella Dar yesterday.  Prep done now (? Quality) Bleeding continues, Hgb down to 8.9 today.  Patient seen in pre-op area - hemodynamically stable, no distress, alert and conversational.  EGD and colonoscopy.  Charlie Pitter III

## 2020-01-22 NOTE — Anesthesia Preprocedure Evaluation (Addendum)
Anesthesia Evaluation  Patient identified by MRN, date of birth, ID band  Reviewed: Allergy & Precautions, NPO status , Patient's Chart, lab work & pertinent test results  Airway Mallampati: II  TM Distance: >3 FB Neck ROM: Full    Dental no notable dental hx.    Pulmonary neg pulmonary ROS,    Pulmonary exam normal breath sounds clear to auscultation       Cardiovascular Exercise Tolerance: Good hypertension,  Rhythm:Regular Rate:Tachycardia     Neuro/Psych negative neurological ROS  negative psych ROS   GI/Hepatic hiatal hernia,   Endo/Other  diabetes  Renal/GU Renal InsufficiencyRenal disease     Musculoskeletal  (+) Arthritis ,   Abdominal Normal abdominal exam  (+)   Peds  Hematology  (+) Blood dyscrasia, anemia ,   Anesthesia Other Findings   Reproductive/Obstetrics                            Anesthesia Physical Anesthesia Plan  ASA: III  Anesthesia Plan: MAC   Post-op Pain Management:    Induction: Intravenous  PONV Risk Score and Plan: 1 and Propofol infusion and TIVA  Airway Management Planned: Natural Airway, Simple Face Mask and Nasal Cannula  Additional Equipment:   Intra-op Plan:   Post-operative Plan:   Informed Consent: I have reviewed the patients History and Physical, chart, labs and discussed the procedure including the risks, benefits and alternatives for the proposed anesthesia with the patient or authorized representative who has indicated his/her understanding and acceptance.     Dental advisory given  Plan Discussed with:   Anesthesia Plan Comments:         Anesthesia Quick Evaluation

## 2020-01-22 NOTE — Progress Notes (Signed)
Earlier this afternoon we communicated with the teaching service about this patient severe anemia and then ordered 2 units PRBCs.  CT angiogram did not show active bleeding.  I still believe this has been diverticular bleeding that is intermittent, which is how it typically behaves.  We will manage him expectantly with close observation, serial hemoglobins, transfusion as needed, liquid diet for now.  If he has brisk rebleeding with a drop in hemoglobin, then would most likely need another stat CT angiogram to see if we can catch active bleeding that might be amenable to angiographic control with interventional radiology.

## 2020-01-22 NOTE — Op Note (Signed)
[facility] Patient Name: Derrick Thornton Procedure Date : 01/22/2020 MRN: [id number] Attending MD: Starr Lake. Myrtie Neither , MD Date of Birth: 1945/12/03 CSN: [id number] Age: 74 Admit Type: Inpatient Procedure:                Upper GI endoscopy Indications:              Melena Providers:                Sherilyn Cooter L. Myrtie Neither, MD, Norman Clay, RN, Brion Aliment,                            Technician Referring MD:             [facility] Medicines:                Monitored Anesthesia Care Complications:            No immediate complications. Estimated Blood Loss:     Estimated blood loss was minimal. Procedure:                Pre-Anesthesia Assessment:                           - Prior to the procedure, a History and Physical                            was performed, and patient medications and                            allergies were reviewed. The patient's tolerance of                            previous anesthesia was also reviewed. The risks                            and benefits of the procedure and the sedation                            options and risks were discussed with the patient.                            All questions were answered, and informed consent                            was obtained. Prior Anticoagulants: The patient has                            taken no previous anticoagulant or antiplatelet                            agents. ASA Grade Assessment: III - A patient with                            severe systemic disease. After reviewing the risks  and benefits, the patient was deemed in                            satisfactory condition to undergo the procedure.                           After obtaining informed consent, the endoscope was                            passed under direct vision. Throughout the                            procedure, the patient's blood pressure, pulse, and                            oxygen saturations  were monitored continuously. The                            [id number]-H190 ([id number] Olympus gastroscope was                            introduced through the mouth, and advanced to the                            second part of duodenum. The upper GI endoscopy was                            accomplished without difficulty. The patient                            tolerated the procedure well. Scope In: Scope Out: Findings:      A 3 cm hiatal hernia was present.      There were esophageal mucosal changes secondary to established       short-segment Barrett's disease present in the lower third of the       esophagus. (Several tongues and an island)The maximum longitudinal       extent of these mucosal changes was 2 cm in length. Mucosa was biopsied       with a cold forceps for histology in a targeted manner at intervals of 2       cm at 35 and 37 cm from the incisors. A total of 2 specimen bottles were       sent to pathology.      The exam of the esophagus was otherwise normal.      The stomach was normal.      The cardia and gastric fundus were normal on retroflexion.      The examined duodenum was normal. Impression:               - 3 cm hiatal hernia.                           - Esophageal mucosal changes secondary to                            established short-segment Barrett's disease.  Biopsied.                           - Normal stomach.                           - Normal examined duodenum.                           No source of UGI bleeding seen. Recommendation:           - Return patient to hospital ward for ongoing care.                           - Clear liquid diet.                           - See the other procedure note for documentation of                            additional recommendations. Procedure Code(s):        --- Professional ---                           [phone], Esophagogastroduodenoscopy, flexible,                            transoral;  with biopsy, single or multiple Diagnosis Code(s):        --- Professional ---                           K44.9, Diaphragmatic hernia without obstruction or                            gangrene                           K22.70, Barrett's esophagus without dysplasia                           K92.1, Melena (includes Hematochezia) CPT copyright 2019 [employer]. All rights reserved. The codes documented in this report are preliminary and upon coder review may  be revised to meet current compliance requirements. Angelee Bahr L. Myrtie Neither, MD 01/22/2020 8:51:01 AM This report has been signed electronically. Number of Addenda: 0

## 2020-01-22 NOTE — Progress Notes (Addendum)
12:53  Ref. Range 01/22/2020 11:43  WBC Latest Ref Range: 4.0 - 10.5 K/uL 9.8  RBC Latest Ref Range: 4.22 - 5.81 MIL/uL 1.97 (L)  Hemoglobin Latest Ref Range: 13.0 - 17.0 g/dL 6.6 (LL)  HCT Latest Ref Range: 39 - 52 % 20.6 (L)  MCV Latest Ref Range: 80.0 - 100.0 fL 104.6 (H)  MCH Latest Ref Range: 26.0 - 34.0 pg 33.5  MCHC Latest Ref Range: 30.0 - 36.0 g/dL 82.9  RDW Latest Ref Range: 11.5 - 15.5 % 12.5  Platelets Latest Ref Range: 150 - 400 K/uL 188  nRBC Latest Ref Range: 0.0 - 0.2 % 0.0   Critical HB value reported to [employer]

## 2020-01-22 NOTE — Op Note (Signed)
[facility] Patient Name: Derrick Thornton Procedure Date : 01/22/2020 MRN: [id number] Attending MD: Starr Lake. Myrtie Neither , MD Date of Birth: 1945/12/11 CSN: [id number] Age: 74 Admit Type: Inpatient Procedure:                Colonoscopy Indications:              Hematochezia, Acute post hemorrhagic anemia Providers:                Sherilyn Cooter L. Myrtie Neither, MD, Norman Clay, RN, Arlee Muslim                            Tech., Technician, Brion Aliment, Technician Referring MD:             [facility] Medicines:                Monitored Anesthesia Care Complications:            No immediate complications. Estimated Blood Loss:     Estimated blood loss: none. Procedure:                Pre-Anesthesia Assessment:                           - Prior to the procedure, a History and Physical                            was performed, and patient medications and                            allergies were reviewed. The patient's tolerance of                            previous anesthesia was also reviewed. The risks                            and benefits of the procedure and the sedation                            options and risks were discussed with the patient.                            All questions were answered, and informed consent                            was obtained. Prior Anticoagulants: The patient has                            taken no previous anticoagulant or antiplatelet                            agents. ASA Grade Assessment: III - A patient with                            severe systemic disease. After reviewing the risks  and benefits, the patient was deemed in                            satisfactory condition to undergo the procedure.                           After obtaining informed consent, the colonoscope                            was passed under direct vision. Throughout the                            procedure, the patient's blood pressure,  pulse, and                            oxygen saturations were monitored continuously. The                            CF-HQ190L ([id number] Olympus colonoscope was                            introduced through the anus and advanced to the the                            terminal ileum, with identification of the                            appendiceal orifice and IC valve. The colonoscopy                            was performed with difficulty due to multiple                            diverticula in the colon and poor endoscopic                            visualization. Successful completion of the                            procedure was aided by lavage (but visualization                            remained poor in the left colon). The patient                            tolerated the procedure well. The quality of the                            bowel preparation was fair due to blood and clots.                            The terminal ileum, ileocecal valve, appendiceal  orifice, and rectum were photographed. Scope In: 8:12:45 AM Scope Out: 8:35:31 AM Scope Withdrawal Time: 0 hours 17 minutes 15 seconds  Total Procedure Duration: 0 hours 22 minutes 46 seconds  Findings:      The perianal and digital rectal examinations were normal.      The terminal ileum appeared normal. (intubated for about 10cm)      Many large-mouthed diverticula were found in the left colon and right       colon.      Red blood was found from splenic flexure to rectum. There was scant       blood in the right colon.      The exam was otherwise without abnormality. Impression:               - Preparation of the colon was fair.                           - The examined portion of the ileum was normal.                           - Diverticulosis in the left colon and in the right                            colon.                           - Blood from splenic flexure to rectum.                            - The examination was otherwise normal.                           - No specimens collected.                           Suspected diverticular bleeding, more likely left                            than right but cannot be certain. Recommendation:           - Return patient to hospital ward for ongoing care.                           - Clear liquid diet.                           - CBC now and then every 12 hours x 5                           CT Angiogram this morning to assess for ongoing                            bleeding AND CALL INTERVENTIONAL RADIOLOGY IF                            POSITIVE. Procedure Code(s):        --- Professional ---  [id number], Colonoscopy, flexible; diagnostic, including                            collection of specimen(s) by brushing or washing,                            when performed (separate procedure) Diagnosis Code(s):        --- Professional ---                           K92.2, Gastrointestinal hemorrhage, unspecified                           K92.1, Melena (includes Hematochezia)                           D62, Acute posthemorrhagic anemia                           K57.30, Diverticulosis of large intestine without                            perforation or abscess without bleeding CPT copyright 2019 [employer]. All rights reserved. The codes documented in this report are preliminary and upon coder review may  be revised to meet current compliance requirements. Jerrod Damiano L. Myrtie Neitheranis, MD 01/22/2020 8:58:19 AM This report has been signed electronically. Number of Addenda: 0

## 2020-01-22 NOTE — Progress Notes (Signed)
FPTS Interim Progress Note  S: Patient returned from EGD and colonoscopy.  Patient has also been to CT angiogram.  Per GI patient will remain on clear liquid diet which he was taking well.  Patient has no complaints.  O: BP 112/62 (BP Location: Right Arm)   Pulse 73   Temp 98.8 F (37.1 C) (Oral)   Resp 18   Ht 5\' 9"  (1.753 m)   Wt 76.8 kg   SpO2 95%   BMI 25.02 kg/m   General: Patient sitting up in bed eating clear liquids diet CV: RRR, no murmur Respiratory: Clear and equal bilaterally, good work of breathing GI: Continues to have LLQ pain and left periumbilical pain although decreased than before with palpation, normoactive bowel sounds Extremities: Thin distal pulses intact  A/P: Per GI may have active bleed and they recommended CTA. Pending CTA read per GI patient may need IR.  GI would like patient to continue IV PPI and he will remain on clears.  , MD 01/22/2020, 1:16 PM PGY-1, [facility] Family Medicine Service pager [phone]

## 2020-01-23 ENCOUNTER — Encounter: Payer: Self-pay | Admitting: Gastroenterology

## 2020-01-23 ENCOUNTER — Encounter (HOSPITAL_COMMUNITY): Payer: Self-pay | Admitting: Gastroenterology

## 2020-01-23 DIAGNOSIS — K5731 Diverticulosis of large intestine without perforation or abscess with bleeding: Principal | ICD-10-CM

## 2020-01-23 LAB — BPAM RBC
Blood Product Expiration Date: 202109202359
Blood Product Expiration Date: 202109212359
ISSUE DATE / TIME: 202108241623
ISSUE DATE / TIME: 202108242014
Unit Type and Rh: 6200
Unit Type and Rh: 6200

## 2020-01-23 LAB — BASIC METABOLIC PANEL
Anion gap: 8 (ref 5–15)
BUN: <5 mg/dL — ABNORMAL LOW (ref 8–23)
CO2: 25 mmol/L (ref 22–32)
Calcium: 8.3 mg/dL — ABNORMAL LOW (ref 8.9–10.3)
Chloride: 107 mmol/L (ref 98–111)
Creatinine, Ser: 0.88 mg/dL (ref 0.61–1.24)
GFR calc Af Amer: >60 mL/min (ref >60)
GFR calc non Af Amer: >60 mL/min (ref >60)
Glucose, Bld: 107 mg/dL — ABNORMAL HIGH (ref 70–99)
Potassium: 3.2 mmol/L — ABNORMAL LOW (ref 3.5–5.1)
Sodium: 140 mmol/L (ref 135–145)

## 2020-01-23 LAB — TYPE AND SCREEN
ABO/RH(D): A POS
Antibody Screen: NEGATIVE
Unit division: 0
Unit division: 0

## 2020-01-23 LAB — GLUCOSE, CAPILLARY
Glucose-Capillary: 107 mg/dL — ABNORMAL HIGH (ref 70–99)
Glucose-Capillary: 96 mg/dL (ref 70–99)
Glucose-Capillary: 102 mg/dL — ABNORMAL HIGH (ref 70–99)
Glucose-Capillary: 140 mg/dL — ABNORMAL HIGH (ref 70–99)

## 2020-01-23 LAB — SURGICAL PATHOLOGY

## 2020-01-23 LAB — HEMOGLOBIN AND HEMATOCRIT, BLOOD
HCT: 23.1 % — ABNORMAL LOW (ref 39.0–52.0)
Hemoglobin: 7.7 g/dL — ABNORMAL LOW (ref 13.0–17.0)

## 2020-01-23 MED ORDER — PANTOPRAZOLE SODIUM 40 MG PO TBEC
40.0000 mg | DELAYED_RELEASE_TABLET | Freq: Every day | ORAL | Status: DC
  Administered 2020-01-23 – 2020-01-24 (×2): 40 mg via ORAL
  Filled 2020-01-23 (×2): qty 1

## 2020-01-23 MED ORDER — POTASSIUM CHLORIDE CRYS ER 20 MEQ PO TBCR
40.0000 meq | EXTENDED_RELEASE_TABLET | ORAL | Status: AC
  Administered 2020-01-23 (×2): 40 meq via ORAL
  Filled 2020-01-23 (×2): qty 2

## 2020-01-23 NOTE — Progress Notes (Addendum)
Daily Rounding Note  01/23/2020, 9:22 AM  LOS: 2 days   SUBJECTIVE:   Chief complaint: hematochezia, dark stools.  anemia    Feeling well.  1 dark stool overnight.  No hematochezia.  Slight L abd TTTP.  Denies abdominal pain.  Denies chest pain dyspnea or dysuria  OBJECTIVE:         Vital signs in last 24 hours:    Temp:  [97.7 F (36.5 C)-99.1 F (37.3 C)] 99.1 F (37.3 C) (08/25 0528) Pulse Rate:  [73-94] 77 (08/25 0528) Resp:  [18-19] 18 (08/25 0528) BP: (106-121)/(62-73) 106/67 (08/25 0528) SpO2:  [95 %-97 %] 95 % (08/25 0528) Last BM Date: 01/21/20 Filed Weights   01/20/20 1700 01/21/20 2041  Weight: 75.9 kg 76.8 kg   General: looks well, comfortable   Heart: RRR Chest: clear bil Abdomen: minor L upper/mid abd tenderness.  Active BS  Extremities: no CCE Neuro/Psych:  Oriented x 3.  No gross deficits.  Fluid speech.  Calm.    Intake/Output from previous day: 08/24 0701 - 08/25 0700 In: 1214.5 [P.O.:600; I.V.:614.5] Out: 1675 [Urine:1475; Blood:200]  Intake/Output this shift: No intake/output data recorded.  Lab Results: Recent Labs    01/21/20 0654 01/22/20 1143 01/23/20 0711  WBC 5.8 9.8 8.5  HGB 8.9* 6.6* 7.9*  HCT 28.3* 20.6* 24.3*  PLT 214 188 182   BMET Recent Labs    01/21/20 0654 01/23/20 0711  NA 143 140  K 3.9 3.2*  CL 110 107  CO2 22 25  GLUCOSE 107* 107*  BUN 19 <5*  CREATININE 1.08 0.88  CALCIUM 8.5* 8.3*   LFT Recent Labs    01/21/20 0654  PROT 5.5*  ALBUMIN 3.1*  AST 19  ALT 22  ALKPHOS 48  BILITOT 0.8   PT/INR No results for input(s): LABPROT, INR in the last 72 hours. Hepatitis Panel No results for input(s): HEPBSAG, HCVAB, HEPAIGM, HEPBIGM in the last 72 hours.  Studies/Results: CT Angio Abd/Pel w/ and/or w/o  Result Date: 01/22/2020 CLINICAL DATA:  75 year old male with melena EXAM: CTA ABDOMEN AND PELVIS WITHOUT AND WITH CONTRAST TECHNIQUE:  Multidetector CT imaging of the abdomen and pelvis was performed using the standard protocol during bolus administration of intravenous contrast. Multiplanar reconstructed images and MIPs were obtained and reviewed to evaluate the vascular anatomy. CONTRAST:  OMNIPAQUE IOHEXOL 350 MG/ML SOLN COMPARISON:  01/20/2020, 06/15/2019 FINDINGS: VASCULAR Aorta: Unremarkable course, caliber, contour of the abdominal aorta. No dissection, aneurysm, or periaortic fluid. Celiac: Patent, with no significant atherosclerotic changes. SMA: Patent, with no significant atherosclerotic changes. Renals: - Right: Right renal artery patent. - Left: Left renal artery patent. IMA: Inferior mesenteric artery is patent. Right lower extremity: Unremarkable course, caliber, and contour of the right iliac system. No aneurysm, dissection, or occlusion. Hypogastric artery is patent. Common femoral artery patent. Proximal SFA and profunda femoris patent. Left lower extremity: Unremarkable course, caliber, and contour of the left iliac system. No aneurysm, dissection, or occlusion. Hypogastric artery is patent. Common femoral artery patent. Proximal SFA and profunda femoris patent. Veins: Unremarkable appearance of the venous system. Review of the MIP images confirms the above findings. NON-VASCULAR Lower chest: No acute. Hepatobiliary: Unremarkable appearance of the liver. Hyperdense material layered in the dependent gallbladder, potentially sludge/microlithiasis. Pancreas: Unremarkable. Spleen: Unremarkable. Adrenals/Urinary Tract: - Right adrenal gland: Unremarkable - Left adrenal gland: Unremarkable. - Right kidney: No hydronephrosis, nephrolithiasis, inflammation, or ureteral dilation. No focal lesion. - Left Kidney:  No hydronephrosis. Stone in the lower pole collecting system measures 11 mm, nonobstructive. Low-density/nonenhancing cystic lesion on the medial cortex. Low-density cystic lesion in the inferior cortex. - Urinary Bladder:  Unremarkable. Stomach/Bowel: - Stomach: Small hiatal hernia.  Otherwise unremarkable stomach - Small bowel: Unremarkable - Appendix: Appendix is not visualized, however, no inflammatory changes are present adjacent to the cecum to indicate an appendicitis. - Colon: Colonic diverticula throughout the colon. No focal inflammatory changes. No pooling of contrast or air-fluid level to indicate a site of GI hemorrhage. No significant stool burden. Lymphatic: No adenopathy. Mesenteric: No free fluid or air. No mesenteric adenopathy. Reproductive: Transverse diameter of the prostate measures 52 mm. Other: Small fat containing umbilical hernia. Musculoskeletal: No acute displaced fracture. Degenerative changes of the spine. No bony canal narrowing. Mild degenerative changes of the hips. IMPRESSION: CT is negative for evidence of gastrointestinal hemorrhage. Extensive colonic diverticula again demonstrated with no evidence of acute diverticulitis. Additional ancillary findings as above. Signed, Yvone Neu. Reyne Dumas, RPVI [facility] Radiology Electronically Signed   By: Gilmer Mor D.O.   On: 01/22/2020 15:56    ASSESMENT:   *   LGIB, suspect diverticular.  Bloody and melenic stool.  Divertic bleed 06/2019.  8.24 colonoscopy: blood in left colon, tics R and L colon but no exact source of bleeding, suspect divertic bleed.   8/24 EGD: HH, short segment barretts bx's, O/w normal to duodenum.  Esoph path: Squamous and glandular epithelium with acute and chronic inflammation.  Intestinal metaplasia w/o dysplasia, malignancy; findings c/w Barretts.     8/25 CTAP/andiography: no active bleeding.  Extensive diverticulosis.    *   ABL anemia Hgb 6.6 >> 2 PRBC >>  7.9  *   Hypokalemia.     PLAN   *   If brisk re-bleeding, proceed to stat CTAP/angio  *   Switch back to oral PPI.  Note that intake meds info describes Omeprazole 40 mg daily prn. w Barretts, needs  chronic daily PPI.    *   ? Advance diet?   Jennye Moccasin  01/23/2020, 9:22 AM Phone [phone]  I have discussed the case with the PA, and that is the plan I formulated. I personally interviewed and examined the patient.  Diverticular bleeding appears to have stopped, I think he is passing old blood and clots, of which there was a lot remaining after the colonoscopy yesterday. CT angiogram without active bleeding.  Hemoglobin rose with transfusion.  Full liquid diet today, and hold there until tomorrow.  If no recurrent bleeding by tomorrow morning, we will advance to regular diet and perhaps observe another day depending on his clinical progress. Charlie Pitter III Office: [phone]

## 2020-01-23 NOTE — Progress Notes (Signed)
Family Medicine Teaching Service Daily Progress Note Intern Pager: [phone]  Patient name: Derrick Thornton Medical record number: [id number] Date of birth: 17-Aug-1945 Age: 74 y.o. Gender: male  Primary Care Provider: System, Provider Not In Consultants: GI, IR Code Status: Full  Pt Overview and Major Events to Date:  8/22 admitted 8/24 received 2U pRBCs, EGD, Colonscopy, CTA Abdomen  Assessment and Plan: Acute onset hematochezia/melena history of diverticulosis/diverticulitis Patient received EGD and colonoscopy yesterday 8/24. EGD was negative for bleeding source; patient did have a small segment of Barrett's esophagus. Patient had active bleeding and visualization was fair on colonoscopy. Due to active bleeding patient was sent for CTA Abdomen which was negative for abdominal hemorrhage. Per GI recs we will continue to monitor patient Hgb and provide liquid diet for now. They believe that patient has a diverticular bleed that is intermittent. If patient has brisk rebleeding with a drop in Hgb then patient will need a STAT CTA to catch active bleeding that might be amendable to angiographic control with IR. On exam patient appears stable. Hgb 7.9 this AM. Patient K+ 3.2 this AM. -GI consulted, appreciate recommendations  -monitor Hgb BID -Transfuse for Hgb< 8,per GI -ContinueProtonix IV 40 inpatient - K+ PO q4  BID - CTA STAT if patient Hgb drops again or patient reports hematochezia to RN  HTN Patient remains normotensive.BP range104/57-133/68. BP this AM 106/67. -We will continue to monitorwith vitals  HLD Patient home medication includes rosuvastatin 10 mg. -Continue home medication  Acute on macrocyticanemia Folate 23.3. Patient Hgb resulted 6.6 8/24. Patient received 2U pRBCs. Hgb this AM: 7.9. - avoid PO iron until colonoscopy complete and GI bleed has fully resolved  Type 2 diabetes Patient home medication includesmetformin andglimepiride2mg  as  needed.CBG 107. A1c 5.3 at admission, did not require insulin overnight. -Sensitive sliding scaleinpatient  BPH Patient has a history of enlarged prostate. Enlarged prostate noted on CT abdomen pelvis. Patient home medication includes Flomax 0.4mg . -Continue home medication  FEN/GI:Full Liquid  Prophylaxis:SCDs due to active bleed  Subjective:  Patient required 2U pRBCs yesterday after Hgb 6.6. P[atient responded well. Patient self-reports hematochezia 30 min prior to interview after finishing breakfast. Patient continues to have minor, intermittent "prickling" sensation in his LLQ. No chest pain or SOB.  Objective: Temp:  [97.7 F (36.5 C)-99.1 F (37.3 C)] 99.1 F (37.3 C) (08/25 0528) Pulse Rate:  [50-94] 77 (08/25 0528) Resp:  [10-19] 18 (08/25 0528) BP: (89-121)/(32-73) 106/67 (08/25 0528) SpO2:  [90 %-100 %] 95 % (08/25 0528) Physical Exam: General: Resting in bed, NAD, appropriate mood and affect Cardiovascular: RRR no murmur detected Respiratory: Clear and equal bilaterally, good work of breathing Abdomen: LLQ pain to palpation, hyperactive bowel sounds Extremities: <2sec cap refill, Distal pulses intact, No edema noted in BLE  Laboratory: Recent Labs  Lab 01/20/20 1107 01/20/20 1107 01/20/20 1600 01/21/20 0654 01/22/20 1143  WBC 7.6  --   --  5.8 9.8  HGB 11.6*   < > 10.6* 8.9* 6.6*  HCT 36.8*   < > 33.8* 28.3* 20.6*  PLT 262  --   --  214 188   < > = values in this interval not displayed.   Recent Labs  Lab 01/19/20 1946 01/21/20 0654  NA 140 143  K 3.9 3.9  CL 105 110  CO2 27 22  BUN 18 19  CREATININE 1.07 1.08  CALCIUM 9.4 8.5*  PROT 7.2 5.5*  BILITOT 0.7 0.8  ALKPHOS 64 48  ALT 27  22  AST 21 19  GLUCOSE 104* 107*    Imaging/Diagnostic Tests: CT Angio Abd/Pel w/ and/or w/o  Result Date: 01/22/2020 CLINICAL DATA:  74 year old male with melena EXAM: CTA ABDOMEN AND PELVIS WITHOUT AND WITH CONTRAST TECHNIQUE: Multidetector CT  imaging of the abdomen and pelvis was performed using the standard protocol during bolus administration of intravenous contrast. Multiplanar reconstructed images and MIPs were obtained and reviewed to evaluate the vascular anatomy. CONTRAST:  OMNIPAQUE IOHEXOL 350 MG/ML SOLN COMPARISON:  01/20/2020, 06/15/2019 FINDINGS: VASCULAR Aorta: Unremarkable course, caliber, contour of the abdominal aorta. No dissection, aneurysm, or periaortic fluid. Celiac: Patent, with no significant atherosclerotic changes. SMA: Patent, with no significant atherosclerotic changes. Renals: - Right: Right renal artery patent. - Left: Left renal artery patent. IMA: Inferior mesenteric artery is patent. Right lower extremity: Unremarkable course, caliber, and contour of the right iliac system. No aneurysm, dissection, or occlusion. Hypogastric artery is patent. Common femoral artery patent. Proximal SFA and profunda femoris patent. Left lower extremity: Unremarkable course, caliber, and contour of the left iliac system. No aneurysm, dissection, or occlusion. Hypogastric artery is patent. Common femoral artery patent. Proximal SFA and profunda femoris patent. Veins: Unremarkable appearance of the venous system. Review of the MIP images confirms the above findings. NON-VASCULAR Lower chest: No acute. Hepatobiliary: Unremarkable appearance of the liver. Hyperdense material layered in the dependent gallbladder, potentially sludge/microlithiasis. Pancreas: Unremarkable. Spleen: Unremarkable. Adrenals/Urinary Tract: - Right adrenal gland: Unremarkable - Left adrenal gland: Unremarkable. - Right kidney: No hydronephrosis, nephrolithiasis, inflammation, or ureteral dilation. No focal lesion. - Left Kidney: No hydronephrosis. Stone in the lower pole collecting system measures 11 mm, nonobstructive. Low-density/nonenhancing cystic lesion on the medial cortex. Low-density cystic lesion in the inferior cortex. - Urinary Bladder: Unremarkable.  Stomach/Bowel: - Stomach: Small hiatal hernia.  Otherwise unremarkable stomach - Small bowel: Unremarkable - Appendix: Appendix is not visualized, however, no inflammatory changes are present adjacent to the cecum to indicate an appendicitis. - Colon: Colonic diverticula throughout the colon. No focal inflammatory changes. No pooling of contrast or air-fluid level to indicate a site of GI hemorrhage. No significant stool burden. Lymphatic: No adenopathy. Mesenteric: No free fluid or air. No mesenteric adenopathy. Reproductive: Transverse diameter of the prostate measures 52 mm. Other: Small fat containing umbilical hernia. Musculoskeletal: No acute displaced fracture. Degenerative changes of the spine. No bony canal narrowing. Mild degenerative changes of the hips. IMPRESSION: CT is negative for evidence of gastrointestinal hemorrhage. Extensive colonic diverticula again demonstrated with no evidence of acute diverticulitis. Additional ancillary findings as above. Signed, Yvone Neu. Reyne Dumas, RPVI [facility] Radiology Electronically Signed   By: Gilmer Mor D.O.   On: 01/22/2020 15:56   Bobbye Morton, MD 01/23/2020, 7:57 AM PGY-1, [facility] Family Medicine FPTS Intern pager: [phone], text pages welcome

## 2020-01-23 NOTE — Plan of Care (Signed)
  Problem: Education: Goal: Ability to identify signs and symptoms of gastrointestinal bleeding will improve Outcome: Progressing   Problem: Bowel/Gastric: Goal: Will show no signs and symptoms of gastrointestinal bleeding Outcome: Progressing   Problem: Elimination: Goal: Will not experience complications related to bowel motility Outcome: Progressing

## 2020-01-24 LAB — CBC
HCT: 24.3 % — ABNORMAL LOW (ref 39.0–52.0)
Hemoglobin: 7.9 g/dL — ABNORMAL LOW (ref 13.0–17.0)
MCH: 30.2 pg (ref 26.0–34.0)
MCHC: 32.5 g/dL (ref 30.0–36.0)
MCV: 92.7 fL (ref 80.0–100.0)
Platelets: 182 10*3/uL (ref 150–400)
RBC: 2.62 MIL/uL — ABNORMAL LOW (ref 4.22–5.81)
RDW: 20.3 % — ABNORMAL HIGH (ref 11.5–15.5)
WBC: 8.5 10*3/uL (ref 4.0–10.5)
nRBC: 0 % (ref 0.0–0.2)

## 2020-01-24 LAB — GLUCOSE, CAPILLARY
Glucose-Capillary: 110 mg/dL — ABNORMAL HIGH (ref 70–99)
Glucose-Capillary: 128 mg/dL — ABNORMAL HIGH (ref 70–99)
Glucose-Capillary: 120 mg/dL — ABNORMAL HIGH (ref 70–99)

## 2020-01-24 LAB — HEMOGLOBIN AND HEMATOCRIT, BLOOD
HCT: 21.9 % — ABNORMAL LOW (ref 39.0–52.0)
HCT: 24.7 % — ABNORMAL LOW (ref 39.0–52.0)
Hemoglobin: 7.2 g/dL — ABNORMAL LOW (ref 13.0–17.0)
Hemoglobin: 7.8 g/dL — ABNORMAL LOW (ref 13.0–17.0)

## 2020-01-24 LAB — BASIC METABOLIC PANEL
Anion gap: 6 (ref 5–15)
BUN: 5 mg/dL — ABNORMAL LOW (ref 8–23)
CO2: 26 mmol/L (ref 22–32)
Calcium: 8.3 mg/dL — ABNORMAL LOW (ref 8.9–10.3)
Chloride: 109 mmol/L (ref 98–111)
Creatinine, Ser: 0.91 mg/dL (ref 0.61–1.24)
GFR calc Af Amer: >60 mL/min (ref >60)
GFR calc non Af Amer: >60 mL/min (ref >60)
Glucose, Bld: 112 mg/dL — ABNORMAL HIGH (ref 70–99)
Potassium: 4 mmol/L (ref 3.5–5.1)
Sodium: 141 mmol/L (ref 135–145)

## 2020-01-24 MED ORDER — FERROUS SULFATE 325 (65 FE) MG PO TBEC: 325.0000 mg | DELAYED_RELEASE_TABLET | Freq: Two times a day (BID) | ORAL | 0 refills | Status: DC

## 2020-01-24 MED ORDER — OMEPRAZOLE 40 MG PO CPDR: 40.0000 mg | DELAYED_RELEASE_CAPSULE | Freq: Every day | ORAL | 0 refills | Status: AC

## 2020-01-24 MED ORDER — SODIUM CHLORIDE 0.9 % IV SOLN
510.0000 mg | Freq: Once | INTRAVENOUS | Status: AC
  Administered 2020-01-24: 510 mg via INTRAVENOUS
  Filled 2020-01-24: qty 17

## 2020-01-24 NOTE — Progress Notes (Addendum)
Family Medicine Teaching Service Daily Progress Note Intern Pager: [phone]  Patient name: Derrick Thornton Medical record number: [id number] Date of birth: 04/12/1946 Age: 74 y.o. Gender: male  Primary Care Provider: System, Provider Not In Consultants: GI, IR Code Status: FULL  Pt Overview and Major Events to Date:  8/22 admitted 8/24 received 2U pRBCs, EGD, Colonscopy, CTA Abdomen  Assessment and Plan: Acute onset hematochezia/melena history of diverticulosis/diverticulitis Per GI rec if patient diet can be advanced to regular after  a day of no recurrent bleeding. GI believes that at this time diverticular bleed has stopped and patient is passing old blood and clots that were seen on colonoscopy.  Patient reports no bloody BM after yesterday AM. Patient does not report abdominal pain this AM. Patient concerned that he has not had a BM, but he has had flatulence. Patient's bowels are likely recuperating from recent bleed.  GI feels that if patient Hgb is stabilized he can be discharged home.   Believe that patient had a diverticular bleed that was intermittent.On exam patient appears stable. Hgb 7.2 this AM. Patient K+ 4.0 this AM. Repeat Hgb 7.8 at noon. Patient is medically stable for discharge. He is handle liquid diet well and can go home with full diet.  -GI consulted, appreciate recommendations  - Discontinue monitor Hgb BID -Transfuse for Hgb< 8,per GI -Transition to PO PPI 40mg  PRN, continue indefinitely.   HTN Patient remains normotensive.BP range110/67-123/75. BP this AM 110/67. -We will continue to monitorwith vitals  HLD Patient home medication includes rosuvastatin 10 mg. -Continue home medication  Acute on macrocyticanemia Folate 23.3. Hgb this AM: 7.2. Repeat 7.8 at noon. - avoid PO iron until colonoscopy complete and GI bleed has fully resolved - IV iron one time  Type 2 diabetes Patient home medication includesmetformin andglimepiride2mg  as  needed.CBG 110. A1c 5.3 at admission, did not require insulin overnight. -Sensitive sliding scaleinpatient  BPH Patient has a history of enlarged prostate. Enlarged prostate noted on CT abdomen pelvis. Patient home medication includes Flomax 0.4mg . -Continue home medication  FEN/GI:carb modified  Prophylaxis:SCDs due to active bleed  Disposition: Home  Subjective:  No overnight events. Patient does not report chest pain, SOB, or abdominal pain this AM. Patient is a bit upset at the Hgb sticks he has been having, but is otherwise okay. Patient understands why he is being stuck but reports that some sticks hurt more than others. Patient also voiced concern that he stopped having bowel movements but is having intermittent gas.   Objective: Temp:  [98 F (36.7 C)-98.7 F (37.1 C)] 98.4 F (36.9 C) (08/26 0554) Pulse Rate:  [79-83] 80 (08/26 0554) Resp:  [18-20] 18 (08/26 0554) BP: (110-123)/(67-80) 110/67 (08/26 0554) SpO2:  [95 %-99 %] 96 % (08/26 0554) Physical Exam: General: NAD, appropriate mood and affect Cardiovascular: RRR, no murmur detected Respiratory: clear and equal bilaterally, good chest expansion, good work of breathing Abdomen: hyperactive bowel  Sounds, no pain to palpation Extremities: Distal pulses intact, cap refill <2 secs  Laboratory: Recent Labs  Lab 01/21/20 0654 01/21/20 0654 01/22/20 1143 01/22/20 1143 01/23/20 0711 01/23/20 1841 01/24/20 0207  WBC 5.8  --  9.8  --  8.5  --   --   HGB 8.9*   < > 6.6*   < > 7.9* 7.7* 7.2*  HCT 28.3*   < > 20.6*   < > 24.3* 23.1* 21.9*  PLT 214  --  188  --  182  --   --    < > =  values in this interval not displayed.   Recent Labs  Lab 01/19/20 1946 01/19/20 1946 01/21/20 0654 01/23/20 0711 01/24/20 0207  NA 140   < > 143 140 141  K 3.9   < > 3.9 3.2* 4.0  CL 105   < > 110 107 109  CO2 27   < > 22 25 26   BUN 18   < > 19 <5* 5*  CREATININE 1.07   < > 1.08 0.88 0.91  CALCIUM 9.4   < > 8.5*  8.3* 8.3*  PROT 7.2  --  5.5*  --   --   BILITOT 0.7  --  0.8  --   --   ALKPHOS 64  --  48  --   --   ALT 27  --  22  --   --   AST 21  --  19  --   --   GLUCOSE 104*   < > 107* 107* 112*   < > = values in this interval not displayed.      Imaging/Diagnostic Tests: No results found. none new  , MD 01/24/2020, 7:22 AM PGY-1, [facility] FPTS Intern pager: [phone], text pages welcome

## 2020-01-24 NOTE — Progress Notes (Signed)
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 Discussed prescriptions and follow up appointments with the patient. Prescriptions given to patient; medication list explained in detail. Patient verbalized understanding.  Skin clean, dry and intact without evidence of skin break down, no evidence of skin tears noted. IV catheter discontinued intact. Site without signs and symptoms of complications. Dressing and pressure applied. Pt denies pain at the site currently. No complaints noted.  Patient free of lines, drains, and wounds.   An After Visit Summary (AVS) was printed and given to the patient. Patient escorted via wheelchair, and discharged home via private auto.  Katrina Stack, RN

## 2020-01-24 NOTE — Plan of Care (Signed)
  Problem: Bowel/Gastric: Goal: Will show no signs and symptoms of gastrointestinal bleeding 01/24/2020 1638 by Katrina Stack, RN Outcome: Adequate for Discharge 01/24/2020 1140 by Katrina Stack, RN Outcome: Progressing   Problem: Fluid Volume: Goal: Will show no signs and symptoms of excessive bleeding Outcome: Adequate for Discharge   Problem: Clinical Measurements: Goal: Complications related to the disease process, condition or treatment will be avoided or minimized Outcome: Adequate for Discharge   Problem: Education: Goal: Knowledge of General Education information will improve Description: Including pain rating scale, medication(s)/side effects and non-pharmacologic comfort measures 01/24/2020 1638 by Katrina Stack, RN Outcome: Adequate for Discharge 01/24/2020 1140 by Katrina Stack, RN Outcome: Progressing   Problem: Health Behavior/Discharge Planning: Goal: Ability to manage health-related needs will improve Outcome: Adequate for Discharge   Problem: Coping: Goal: Level of anxiety will decrease Outcome: Adequate for Discharge   Problem: Nutrition: Goal: Adequate nutrition will be maintained Outcome: Adequate for Discharge   Problem: Elimination: Goal: Will not experience complications related to bowel motility 01/24/2020 1638 by Katrina Stack, RN Outcome: Adequate for Discharge 01/24/2020 1140 by Katrina Stack, RN Outcome: Progressing   Problem: Elimination: Goal: Will not experience complications related to urinary retention Outcome: Adequate for Discharge

## 2020-01-24 NOTE — Discharge Instructions (Signed)
Dear [name],   Thank you so much for allowing [place] to be part of your care!  You were admitted to [facility] for recurrent GI bleed that we think is due to intermittent diverticular bleeding in the setting of your history of diverticulosis and diverticulitis flares.  You were treated with blood transfusions for your low hemoglobin that has now improved.  You also had imaging completed by gastroenterology that was unable to identify a source of bleeding at this time.  Please follow-up with gastroenterology as outpatient.   POST-HOSPITAL & CARE INSTRUCTIONS 1. Please continue to watch for signs of dizziness or lightheadedness and make your primary care physician aware. 2. Please let your doctor know if you begin to experience bloody stools again. 3. Please let PCP/Specialists know of any changes that were made.  4. Please see medications section of this packet for any medication changes.   DOCTOR'S APPOINTMENT & FOLLOW UP CARE INSTRUCTIONS  No future appointments.  RETURN PRECAUTIONS: Please return to care if you begin to experience increased bleeding from your rectum or become dizzy and feel as if you might pass out.  Take care and be well!  Family Medicine Teaching Service Inpatient Team [place]  [facility]  [address] [place], [place] [address] [phone]

## 2020-01-24 NOTE — Plan of Care (Signed)
  Problem: Bowel/Gastric: Goal: Will show no signs and symptoms of gastrointestinal bleeding Outcome: Progressing   Problem: Education: Goal: Knowledge of General Education information will improve Description: Including pain rating scale, medication(s)/side effects and non-pharmacologic comfort measures Outcome: Progressing   Problem: Elimination: Goal: Will not experience complications related to bowel motility Outcome: Progressing

## 2020-01-24 NOTE — Progress Notes (Addendum)
Daily Rounding Note  01/24/2020, 10:25 AM  LOS: 3 days   SUBJECTIVE:   Chief complaint:  LGIB, recurrent divertic bleed.  Blood loss anemia     Feels well.  No BM's or bleeding > 24 hours.  Not dizzy.  He is concerned that he has not had a BM since the colonoscopy.  He does not have abdominal pain, nausea or vomiting.  OBJECTIVE:         Vital signs in last 24 hours:    Temp:  [98.4 F (36.9 C)-98.7 F (37.1 C)] 98.4 F (36.9 C) (08/26 0554) Pulse Rate:  [79-81] 80 (08/26 0554) Resp:  [18-20] 18 (08/26 0554) BP: (110-123)/(67-75) 110/67 (08/26 0554) SpO2:  [96 %-99 %] 96 % (08/26 0554) Last BM Date: 01/22/20 Filed Weights   01/20/20 1700 01/21/20 2041  Weight: 75.9 kg 76.8 kg   General: looks well, alert, comfortable   Heart: RRR Chest: clear bil.  No dyspnea Abdomen: soft, NT, ND.  Active BS  Extremities: no CCE Neuro/Psych:  Oriented x 3.  Fully alsert/oriented.  No deficits, no tremors.   Intake/Output from previous day: 08/25 0701 - 08/26 0700 In: 2239.9 [P.O.:1320; I.V.:919.9] Out: 1600 [Urine:1600]  Intake/Output this shift: Total I/O In: 339.9 [P.O.:240; I.V.:99.9] Out: 300 [Urine:300]  Lab Results: Recent Labs    01/22/20 1143 01/22/20 1143 01/23/20 0711 01/23/20 1841 01/24/20 0207  WBC 9.8  --  8.5  --   --   HGB 6.6*   < > 7.9* 7.7* 7.2*  HCT 20.6*   < > 24.3* 23.1* 21.9*  PLT 188  --  182  --   --    < > = values in this interval not displayed.   BMET Recent Labs    01/23/20 0711 01/24/20 0207  NA 140 141  K 3.2* 4.0  CL 107 109  CO2 25 26  GLUCOSE 107* 112*  BUN <5* 5*  CREATININE 0.88 0.91  CALCIUM 8.3* 8.3*     Scheduled Meds: . insulin aspart  0-9 Units Subcutaneous TID WC  . pantoprazole  40 mg Oral Q0600  . rosuvastatin  10 mg Oral Daily  . tamsulosin  0.4 mg Oral Daily   Continuous Infusions: . sodium chloride 50 mL/hr at 01/24/20 0800   PRN  Meds:.acetaminophen **OR** acetaminophen   ASSESMENT:   *   Recurrent diverticular bleed  8/24 colonoscopy: blood in L colon, tics R and L colon but no exact source of bleeding, suspect divertic bleed.   8/24 EGD: HH, short segment barretts, o/w normal to duodenum.  Esoph path: Squamous and glandular epithelium with acute and chronic inflammation.  Intestinal metaplasia w/o dysplasia, malignancy; findings c/w Barretts.     8/25 CTAP/andiography: no active bleeding.  Extensive diverticulosis.    *   Blood loss anemia.   Hgb 6.6 >> 2 PRBCs >> 7.7 >> 7.2.    PLAN   *   Advance to CM diet.   CBC in AM, pt request butterfly apparatus at future blood draws.  I advised him to make this condition of blood draws.   H and H due at 1300, if stable could discharge home.     Jennye Moccasin  01/24/2020, 10:25 AM Phone [phone]  I have discussed the case with the PA, and that is the plan I formulated. I personally interviewed and examined the patient.  His bleeding has stopped.  He has most likely not had  a BM since the colonoscopy because he had a bowel preparation and only started eating solid food today. Stool softener would be fine, but I would not use cathartic laxatives at this point. Hemoglobin stable, and I think he can be discharged home today from a GI perspective.  There is always the chance he could rebleed, which is unpredictable but would definitely warrant an immediate trip back to the hospital.  He would benefit from a dose of IV iron prior to discharge if his current hemoglobin of 7.8 is felt to be above transfusion threshold.Marland Kitchen    Charlie Pitter III Office: [phone]

## 2020-01-25 NOTE — Progress Notes (Signed)
Contacted patient in order to give update on discharge medication list. Informed patient to not continue diclofenac pills due to increased risk of GI bleeding. No answer, confidential voicemail left with instructions.    Ronnald Ramp, MD  [facility] Service, PGY-2  FPTS Intern Pager [phone]

## 2020-01-26 NOTE — Discharge Summary (Addendum)
Family Medicine Teaching Derrick Thornton Discharge Summary  Patient name: Derrick Thornton Medical record number: [id number] Date of birth: [birth year] Age: 74 y.o. Gender: male Date of Admission: 01/19/2020  Date of Discharge: 01/24/2020 Admitting Physician: Leighton Roach McDiarmid, MD  Primary Care Provider: System, Provider Not In Consultants: GI  Indication for Hospitalization: Acute GI bleed  Discharge Diagnoses/Problem List:  Principal Problem:   Acute GI bleeding Active Problems:   Acute blood loss anemia   Type 2 diabetes mellitus (HCC)   Iron deficiency anemia HLD BPH  Disposition: Home  Discharge Condition: Stable  Discharge Exam:  General: NAD, appropriate mood and affect Cardiovascular: RRR, no murmur detected Respiratory: clear and equal bilaterally, good chest expansion, good work of breathing Abdomen: hyperactive bowel  Sounds, no pain to palpation Extremities: Distal pulses intact, cap refill <2 secs  Brief Hospital Course:  Derrick Thornton is a 74 year old male who presented with acute onset hematochezia/melena with a PMH significant for diverticulosis, diverticulitis, HTN, HLD, T2DM, BPH.  Acute onset hematochezia/melena  history of diverticulosis/diverticulitis FOBT was positive at admission GI was consulted. Hgb on admission WNL at 12.5. EGD and colonoscopy scheduled.  Patient required 2-day bowel prep. After 2 days of prep, colonoscopy visual showed a  fair blood was noted from splenic flexure down but could not rule out active bleed.  EGD was only significant for small section of Barrett's esophagus.  GI recommended CTA abdomen pelvis.  CT abdomen pelvis was negative for abdominal hemorrhage.  GI concluded patient was likely suffering from intermittent bleed from a diverticula that was unable to be visualized during imaging/visualization attempts.  Patient had a few bloody BMs after colonoscopy however suspected to be old clots and blood.  BMs eventually stopped & patient  continued to have flatulence. Patient hemoglobin remained stable.  Acute onset macrocytic anemia Patient MCV at admission 102.  Hgb at admission was 12.4.  Post EGD and colonoscopy patient Hgb 6.6.  Patient received 2U pRBCs.  Hemoglobin was trended and stabilized around 7.8.  Per GI recommendations patient p.o. iron home medication was held at admission due to GI bleed.  Patient received one-time IV iron prior to discharge.  T2 DM Patient home medication regimen including metformin and glimepiride 2 mg as needed.  A1c 5.3 at admission.  Patient was placed on sensitive sliding scale inpatient.    BPH Patient home medication included Flomax 0.4 mg patient received this during his stay in inpatient.  CT abdomen pelvis noted enlarged prostate.   Issues for Follow Up:  1. Consider evaluating Hgb as patient became significantly anemic due to bleeding.  Patient discharged with instructions to continue p.o. iron.  2. Consider following up with patient concerning adherence to Protonix daily 3. Patient instructed to not take oral NSAIDS and not to combine NSAIDS with steroids as it increases risk of GI bleeding.  Significant Procedures:  8/24 EGD, colonoscopy and CTA  Significant Labs and Imaging:  Recent Labs  Lab 01/21/20 0654 01/21/20 0654 01/22/20 1143 01/22/20 1143 01/23/20 0711 01/23/20 0711 01/23/20 1841 01/24/20 0207 01/24/20 1216  WBC 5.8  --  9.8  --  8.5  --   --   --   --   HGB 8.9*   < > 6.6*   < > 7.9*   < > 7.7* 7.2* 7.8*  HCT 28.3*   < > 20.6*   < > 24.3*   < > 23.1* 21.9* 24.7*  PLT 214  --  188  --  182  --   --   --   --    < > =  values in this interval not displayed.   Recent Labs  Lab 01/19/20 1946 01/19/20 1946 01/21/20 0654 01/21/20 0654 01/23/20 0711 01/24/20 0207  NA 140  --  143  --  140 141  K 3.9   < > 3.9   < > 3.2* 4.0  CL 105  --  110  --  107 109  CO2 27  --  22  --  25 26  GLUCOSE 104*  --  107*  --  107* 112*  BUN 18  --  19  --  <5* 5*   CREATININE 1.07  --  1.08  --  0.88 0.91  CALCIUM 9.4  --  8.5*  --  8.3* 8.3*  ALKPHOS 64  --  48  --   --   --   AST 21  --  19  --   --   --   ALT 27  --  22  --   --   --   ALBUMIN 3.9  --  3.1*  --   --   --    < > = values in this interval not displayed.   01/20/2019 CT abdomen pelvis IMPRESSION: 1. No evidence of acute abnormality. 2. Diffuse colonic diverticulosis without evidence of acute diverticulitis. 3. Nonobstructing LEFT renal calculi. 4. Small hiatal hernia. 5. Prostate enlargement.  01/22/2020 CT angio abdomen pelvis  IMPRESSION: CT is negative for evidence of gastrointestinal hemorrhage.  Extensive colonic diverticula again demonstrated with no evidence of acute diverticulitis.  Additional ancillary findings as above.  01/22/2020 colonoscopy and upper endoscopy please review and procedure notes Results/Tests Pending at Time of Discharge: None  Discharge Medications:  Allergies as of 01/24/2020   No Known Allergies     Medication List    STOP taking these medications   diclofenac 75 MG EC tablet Commonly known as: VOLTAREN     TAKE these medications   ferrous sulfate 325 (65 FE) MG EC tablet Take 1 tablet (325 mg total) by mouth 2 (two) times daily.   glimepiride 4 MG tablet Commonly known as: AMARYL Take 1-2 mg by mouth daily as needed (for high sugar).   metFORMIN 500 MG tablet Commonly known as: GLUCOPHAGE Take 500 mg by mouth at bedtime.   omeprazole 40 MG capsule Commonly known as: PRILOSEC Take 1 capsule (40 mg total) by mouth daily. What changed:   when to take this  reasons to take this   OVER THE COUNTER MEDICATION Take 1 tablet by mouth daily. Zembright Mood Plus   OVER THE COUNTER MEDICATION Take 1 capsule by mouth every other day. BEET POWDER   rosuvastatin 5 MG tablet Commonly known as: CRESTOR Take 2.5 mg by mouth daily.   tadalafil 5 MG tablet Commonly known as: CIALIS Take 5 mg by mouth daily as needed for  erectile dysfunction.   tamsulosin 0.4 MG Caps capsule Commonly known as: FLOMAX Take 0.4 mg by mouth daily.       Discharge Instructions: Please refer to Patient Instructions section of EMR for full details.  Patient was counseled important signs and symptoms that should prompt return to medical care, changes in medications, dietary instructions, activity restrictions, and follow up appointments.   Bobbye Morton, MD 01/26/2020, 7:02 PM PGY-1, [facility]

## 2020-02-05 ENCOUNTER — Telehealth: Payer: Self-pay | Admitting: Internal Medicine

## 2020-02-05 NOTE — Telephone Encounter (Signed)
Honestly no NSAID bypasses the gut It is presumed that he had diverticular bleeding, which is now resolved.  NSAIDs are high risk for ulceration than diverticular hemorrhage. Celecoxib has theoretical less risk to the gut but this would need to be prescribed by primary care for arthritis

## 2020-02-05 NOTE — Telephone Encounter (Signed)
Spoke with patient, he reports that he was taking Diclofenac and Prednisone due to his back pain, pt states that he was told to discontinue those medications due to having an acute GI bleed. Pt advised that he could take Tylenol for the pain, he states that that won't help with the inflammation.  One Ibuprofen 200 mg took about an hour and a half to start working. Pt states that his elimination is getting back to normal, taking 1-2 packets of Metamucil daily. Pt wanting to know what type of anti-inflammatory medication that he can take that will bypass his gut. Please advise, thank you

## 2020-02-05 NOTE — Telephone Encounter (Signed)
Spoke with patient regarding recommendations, provided patient with the medication name (Celecoxib) and advised that this would have to be prescribed by PCP for arthritis. Pt advised to stop Ibuprofen and try Tylenol until he can see his PCP.

## 2020-04-01 ENCOUNTER — Telehealth: Payer: Self-pay | Admitting: Internal Medicine

## 2020-04-01 NOTE — Telephone Encounter (Signed)
Pt states he thinks he had a small diverticular bleed that started Sunday night and Monday around 2pm. He has not had any bleeding since then. He states he started taking metamucil and he feels it is working well. He wanted to let Dr. Rhea Belton know. Dr. Rhea Belton notified

## 2020-04-01 NOTE — Telephone Encounter (Signed)
Okay Certainly if further bleeding he needs to let us know and will need CBC Thanks

## 2020-04-01 NOTE — Telephone Encounter (Signed)
Spoke with pt and he is aware. 

## 2020-04-02 ENCOUNTER — Encounter (HOSPITAL_COMMUNITY): Payer: Self-pay | Admitting: Emergency Medicine

## 2020-04-02 ENCOUNTER — Other Ambulatory Visit: Payer: Self-pay

## 2020-04-02 ENCOUNTER — Observation Stay (HOSPITAL_COMMUNITY)
Admission: EM | Admit: 2020-04-02 | Discharge: 2020-04-03 | Disposition: A | Discharge status: Home / Self Care | Payer: Medicare Other | Attending: Family Medicine | Admitting: Family Medicine

## 2020-04-02 DIAGNOSIS — Z79899 Other long term (current) drug therapy: Secondary | ICD-10-CM | POA: Insufficient documentation

## 2020-04-02 DIAGNOSIS — E119 Type 2 diabetes mellitus without complications: Secondary | ICD-10-CM

## 2020-04-02 DIAGNOSIS — I10 Essential (primary) hypertension: Secondary | ICD-10-CM | POA: Diagnosis not present

## 2020-04-02 DIAGNOSIS — K625 Hemorrhage of anus and rectum: Principal | ICD-10-CM | POA: Diagnosis present

## 2020-04-02 DIAGNOSIS — K922 Gastrointestinal hemorrhage, unspecified: Secondary | ICD-10-CM

## 2020-04-02 DIAGNOSIS — Z7984 Long term (current) use of oral hypoglycemic drugs: Secondary | ICD-10-CM | POA: Diagnosis not present

## 2020-04-02 DIAGNOSIS — Z20822 Contact with and (suspected) exposure to covid-19: Secondary | ICD-10-CM | POA: Insufficient documentation

## 2020-04-02 DIAGNOSIS — K5790 Diverticulosis of intestine, part unspecified, without perforation or abscess without bleeding: Secondary | ICD-10-CM | POA: Diagnosis present

## 2020-04-02 LAB — CBC
HCT: 38.5 % — ABNORMAL LOW (ref 39.0–52.0)
Hemoglobin: 12.1 g/dL — ABNORMAL LOW (ref 13.0–17.0)
MCH: 31.2 pg (ref 26.0–34.0)
MCHC: 31.4 g/dL (ref 30.0–36.0)
MCV: 99.2 fL (ref 80.0–100.0)
Platelets: 283 10*3/uL (ref 150–400)
RBC: 3.88 MIL/uL — ABNORMAL LOW (ref 4.22–5.81)
RDW: 13.9 % (ref 11.5–15.5)
WBC: 8.2 10*3/uL (ref 4.0–10.5)
nRBC: 0 % (ref 0.0–0.2)

## 2020-04-02 LAB — RESPIRATORY PANEL BY RT PCR (FLU A&B, COVID)
Influenza A by PCR: NEGATIVE
Influenza B by PCR: NEGATIVE
SARS Coronavirus 2 by RT PCR: NEGATIVE

## 2020-04-02 LAB — PROTIME-INR
INR: 1 (ref 0.8–1.2)
Prothrombin Time: 12.7 seconds (ref 11.4–15.2)

## 2020-04-02 LAB — COMPREHENSIVE METABOLIC PANEL
ALT: 16 U/L (ref 0–44)
AST: 21 U/L (ref 15–41)
Albumin: 3.9 g/dL (ref 3.5–5.0)
Alkaline Phosphatase: 63 U/L (ref 38–126)
Anion gap: 14 (ref 5–15)
BUN: 19 mg/dL (ref 8–23)
CO2: 22 mmol/L (ref 22–32)
Calcium: 9.3 mg/dL (ref 8.9–10.3)
Chloride: 99 mmol/L (ref 98–111)
Creatinine, Ser: 1.16 mg/dL (ref 0.61–1.24)
GFR, Estimated: >60 mL/min (ref >60)
Glucose, Bld: 139 mg/dL — ABNORMAL HIGH (ref 70–99)
Potassium: 3.7 mmol/L (ref 3.5–5.1)
Sodium: 135 mmol/L (ref 135–145)
Total Bilirubin: 0.9 mg/dL (ref 0.3–1.2)
Total Protein: 7.1 g/dL (ref 6.5–8.1)

## 2020-04-02 LAB — TYPE AND SCREEN
ABO/RH(D): A POS
Antibody Screen: NEGATIVE

## 2020-04-02 LAB — GLUCOSE, CAPILLARY: Glucose-Capillary: 173 mg/dL — ABNORMAL HIGH (ref 70–99)

## 2020-04-02 LAB — HEMOGLOBIN A1C
Hgb A1c MFr Bld: 5.5 % (ref 4.8–5.6)
Mean Plasma Glucose: 111.15 mg/dL

## 2020-04-02 LAB — POC OCCULT BLOOD, ED: Fecal Occult Bld: POSITIVE — AB

## 2020-04-02 LAB — HEMOGLOBIN: Hemoglobin: 11.1 g/dL — ABNORMAL LOW (ref 13.0–17.0)

## 2020-04-02 MED ORDER — ONDANSETRON HCL 4 MG PO TABS: 4.0000 mg | ORAL_TABLET | Freq: Four times a day (QID) | ORAL | Status: DC | PRN

## 2020-04-02 MED ORDER — PANTOPRAZOLE SODIUM 40 MG PO TBEC: 40.0000 mg | DELAYED_RELEASE_TABLET | Freq: Every day | ORAL | Status: DC

## 2020-04-02 MED ORDER — PANTOPRAZOLE SODIUM 40 MG IV SOLR
40.0000 mg | INTRAVENOUS | Status: DC
  Administered 2020-04-02: 40 mg via INTRAVENOUS
  Filled 2020-04-02: qty 40

## 2020-04-02 MED ORDER — PANTOPRAZOLE SODIUM 40 MG IV SOLR: 40.0000 mg | INTRAVENOUS | Status: DC

## 2020-04-02 MED ORDER — TADALAFIL 5 MG PO TABS
5.0000 mg | ORAL_TABLET | Freq: Every day | ORAL | Status: DC
  Administered 2020-04-02 – 2020-04-03 (×2): 5 mg via ORAL
  Filled 2020-04-02 (×2): qty 1

## 2020-04-02 MED ORDER — INSULIN ASPART 100 UNIT/ML ~~LOC~~ SOLN
0.0000 [IU] | Freq: Three times a day (TID) | SUBCUTANEOUS | Status: DC
  Administered 2020-04-02: 3 [IU] via SUBCUTANEOUS

## 2020-04-02 MED ORDER — TAMSULOSIN HCL 0.4 MG PO CAPS
0.4000 mg | ORAL_CAPSULE | Freq: Every day | ORAL | Status: DC
  Administered 2020-04-02 – 2020-04-03 (×2): 0.4 mg via ORAL
  Filled 2020-04-02 (×2): qty 1

## 2020-04-02 MED ORDER — DEXTROSE-NACL 5-0.9 % IV SOLN: INTRAVENOUS | Status: DC

## 2020-04-02 MED ORDER — ONDANSETRON HCL 4 MG/2ML IJ SOLN: 4.0000 mg | Freq: Four times a day (QID) | INTRAMUSCULAR | Status: DC | PRN

## 2020-04-02 MED ORDER — SODIUM CHLORIDE 0.9 % IV SOLN: INTRAVENOUS | Status: DC

## 2020-04-02 MED ORDER — ROSUVASTATIN CALCIUM 5 MG PO TABS
2.5000 mg | ORAL_TABLET | Freq: Every day | ORAL | Status: DC
  Administered 2020-04-02 – 2020-04-03 (×2): 2.5 mg via ORAL
  Filled 2020-04-02 (×2): qty 1

## 2020-04-02 NOTE — ED Triage Notes (Signed)
Patient arrives to ED with complaints of dark red blood per rectum x2 days. Hx of hematochezia and diverticulitis. Now feels fatigued and dehydrated. LLQ dull abdominal pain.

## 2020-04-02 NOTE — ED Notes (Signed)
Provider at bedside

## 2020-04-02 NOTE — ED Notes (Signed)
Lab to add on A1C to blood that was already sent to lab.

## 2020-04-02 NOTE — Consult Note (Addendum)
[facility] Consult: 2:39 PM 04/02/2020  LOS: 0 days    Referring Provider: Daisy Lazar in ED Primary Care Physician:  System, Provider Not In Primary Gastroenterologist:  Dr. Rhea Belton. Since 06/2019.      Reason for Consultation: Recurrent hematochezia, likely diverticular bleed   HPI: Derrick Thornton is a 74 y.o. male.  PMH DM 2.  Barrett's esophagus.  Hypertension.  Recurrent episodes and admissions 04/2018 (received 5 PRBCs) , 06/2019 (no transfusions, 12/2019 (2 PRBCs) for painless hematochezia, dark stools attributed to diverticular bleed, ABL anemia.  Previous colonoscopies had been performed by Dr. Noe Gens in [facility].  No history of colon polyps. Latest episode and admission was in late 12/2019. 01/20/2020 CTAP and 01/22/2020 CTAP with angiography showed extensive diverticulosis but no active bleeding. Underwent EGD and colonoscopy.  Received 2 PRBCs. 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  Mucosal changes associated with short segment Barrett's disease, biopsied   Stomach, duodenum normal.  No source for upper GI bleeding.  Path: Acute and chronic inflammation, intestinal metaplasia, "with proper endoscopic findings this is diagnostic of Barrett's esophagus". 01/22/2020 colonoscopy w fair prep revealed left and right colon diverticulosis.  Blood from splenic flexure to rectum but exact source of bleeding not located.  Dr. Myrtie Neither suspected diverticular bleed most likely from the left colon.  Current episode of painless, small volume hematochezia intermingled w stool, developed late Sunday night.  Had a recurrence on Monday afternoon, still smaller amount of blood w brown stool.  On Tuesday another episode of larger amount of blood, same thing this AM.  Dizzy and pre-syncopal this AM, now resolved.  Earlier this week Dr.  Lauro Franklin staff advised patient that if the bleeding continued he would need to proceed to the emergency room.  He came in ~ 10 AM today.  Feels tired, dehydrated.  Heart rate racing briefly, no chest pressure..  Having some dull pain in his left lower quadrant, typical of prior episodes bleeding.  No new meds, no dose changes.  No ASA or NSAIDs.   Hgb 12.1.  It was 7.8 on 01/24/2020.  Platelets normal.  BUN normal.  LFTs normal.  BPs 120s/80s, HR initially 130, now in 90s.      Past Medical History:  Diagnosis Date  . Anemia   . Aortic atherosclerosis (HCC)   . Barrett's esophagus   . DDD (degenerative disc disease), lumbar   . Diabetes mellitus without complication (HCC)   . Diverticulitis   . Diverticulosis   . GI bleed   . Hiatal hernia   . Hyperplastic colon polyp   . Hypertension   . Internal hemorrhoids   . Nephrolithiasis     Past Surgical History:  Procedure Laterality Date  . ACHILLES TENDON REPAIR    . BIOPSY  01/22/2020   Procedure: BIOPSY;  Surgeon: Sherrilyn Rist, MD;  Location: [facility] ENDOSCOPY;  Service: Endoscopy;;  . COLONOSCOPY WITH PROPOFOL N/A 01/22/2020   Procedure: COLONOSCOPY WITH PROPOFOL;  Surgeon: Sherrilyn Rist, MD;  Location: [facility] ENDOSCOPY;  Service: Endoscopy;  Laterality: N/A;  .  ESOPHAGOGASTRODUODENOSCOPY (EGD) WITH PROPOFOL N/A 01/22/2020   Procedure: ESOPHAGOGASTRODUODENOSCOPY (EGD) WITH PROPOFOL;  Surgeon: Sherrilyn Rist, MD;  Location: [facility] ENDOSCOPY;  Service: Endoscopy;  Laterality: N/A;  . KNEE ARTHROSCOPY      Prior to Admission medications   Medication Sig Start Date End Date Taking? Authorizing Provider  Ascorbic Acid (VITAMIN C WITH ROSE HIPS) 500 MG tablet Take 500 mg by mouth daily.   Yes [provider]  glimepiride (AMARYL) 4 MG tablet Take 1-2 mg by mouth daily as needed (for high sugar).    Yes [provider]  metFORMIN (GLUCOPHAGE) 500 MG tablet Take 500 mg by mouth at bedtime. 12/22/19  Yes [provider]  omeprazole (PRILOSEC) 40 MG capsule Take 1 capsule (40 mg total) by mouth daily. 01/24/20  Yes Simmons-Robinson, Makiera, MD  OVER THE COUNTER MEDICATION Take 1 tablet by mouth daily. Zembright Mood Plus   Yes [provider]  OVER THE COUNTER MEDICATION Take 1 capsule by mouth every other day. BEET POWDER    Yes [provider]  rosuvastatin (CRESTOR) 5 MG tablet Take 2.5 mg by mouth daily.   Yes [provider]  tadalafil (CIALIS) 5 MG tablet Take 5 mg by mouth daily as needed for erectile dysfunction.  08/20/15  Yes [provider]  tamsulosin (FLOMAX) 0.4 MG CAPS capsule Take 0.4 mg by mouth daily.   Yes [provider]    Scheduled Meds:  Infusions:  PRN Meds:    Allergies as of 04/02/2020  . (No Known Allergies)    Family History  Problem Relation Age of Onset  . Stroke Father   . Colon cancer Neg Hx   . Stomach cancer Neg Hx   . Pancreatic cancer Neg Hx   . Esophageal cancer Neg Hx     Social History   Socioeconomic History  . Marital status: Married    Spouse name: Not on file  . Number of children: Not on file  . Years of education: Not on file  . Highest education level: Not on file  Occupational History  . Not on file  Tobacco Use  . Smoking status: Never Smoker  . Smokeless tobacco: Never Used  Vaping Use  . Vaping Use: Never used  Substance and Sexual Activity  . Alcohol use: Yes    Comment: occ  . Drug use: Never  . Sexual activity: Yes  Other Topics Concern  . Not on file  Social History Narrative  . Not on file   Social Determinants of Health   Financial Resource Strain:   . Difficulty of Paying Living Expenses: Not on file  Food Insecurity:   . Worried About Programme researcher, broadcasting/film/video in the Last Year: Not on file  . Ran Out of Food in the Last Year: Not on file  Transportation Needs:   . Lack of Transportation (Medical): Not on file  . Lack of Transportation (Non-Medical): Not on file  Physical  Activity:   . Days of Exercise per Week: Not on file  . Minutes of Exercise per Session: Not on file  Stress:   . Feeling of Stress : Not on file  Social Connections:   . Frequency of Communication with Friends and Family: Not on file  . Frequency of Social Gatherings with Friends and Family: Not on file  . Attends Religious Services: Not on file  . Active Member of Clubs or Organizations: Not on file  . Attends Banker Meetings:  Not on file  . Marital Status: Not on file  Intimate Partner Violence:   . Fear of Current or Ex-Partner: Not on file  . Emotionally Abused: Not on file  . Physically Abused: Not on file  . Sexually Abused: Not on file    REVIEW OF SYSTEMS: Constitutional: See HPI ENT:  No nose bleeds Pulm: No dyspnea. CV:  No palpitations, no LE edema.  No angina GU:  No hematuria, no frequency GI: See HPI Heme: Other than the intestinal bleeding, no unusual or excessive bleeding. Transfusions: See HPI. Neuro:  No headaches, no peripheral tingling or numbness Derm:  No itching, no rash or sores.  Endocrine:  No sweats or chills.  No polyuria or dysuria Immunization: Was actually going to have his booster Covid vaccination today but ended up missing that appointment. Travel: Last month he spent some time vacationing in [facility]   PHYSICAL EXAM: Vital signs in last 24 hours: Vitals:   04/02/20 1400 04/02/20 1415  BP: 120/87 124/86  Pulse: 99 93  Resp:    Temp:    SpO2: 98% 96%   Wt Readings from Last 3 Encounters:  04/02/20 79.4 kg  01/21/20 76.8 kg  08/06/19 82.1 kg    General: Pleasant, comfortable, well-appearing gentleman who looks younger than stated age. Head: No facial asymmetry or swelling.  No signs of head trauma. Eyes: No scleral icterus or conjunctival pallor. Ears: Not hard of hearing Nose: No congestion or discharge Mouth: Oropharynx moist, pink, clear.  Tongue midline.  Good dentition. Neck: No JVD, no masses, no  thyromegaly Lungs: Excellent breath sounds, clear bilaterally.  No cough.  No dyspnea Heart: RRR.  No MRG.  S1, S2 present Abdomen: Soft.  Minor left sided, nonfocal tenderness.  No HSM, masses, bruits, hernias.   Rectal: Deferred Musc/Skeltl: No joint redness, swelling or gross deformity. Extremities: No CCE. Neurologic: Alert.  Oriented x3.  Excellent historian.  Moves all 4 limbs with full strength.  No tremors.  No gross deficits. Skin: No telangiectasia, rash or suspicious lesions.  No sores Nodes: No cervical adenopathy Psych: Pleasant, calm, cooperative.  Fluid speech.  Intake/Output from previous day: No intake/output data recorded. Intake/Output this shift: No intake/output data recorded.  LAB RESULTS: Recent Labs    04/02/20 1248  WBC 8.2  HGB 12.1*  HCT 38.5*  PLT 283   BMET Lab Results  Component Value Date   NA 135 04/02/2020   NA 141 01/24/2020   NA 140 01/23/2020   K 3.7 04/02/2020   K 4.0 01/24/2020   K 3.2 (L) 01/23/2020   CL 99 04/02/2020   CL 109 01/24/2020   CL 107 01/23/2020   CO2 22 04/02/2020   CO2 26 01/24/2020   CO2 25 01/23/2020   GLUCOSE 139 (H) 04/02/2020   GLUCOSE 112 (H) 01/24/2020   GLUCOSE 107 (H) 01/23/2020   BUN 19 04/02/2020   BUN 5 (L) 01/24/2020   BUN <5 (L) 01/23/2020   CREATININE 1.16 04/02/2020   CREATININE 0.91 01/24/2020   CREATININE 0.88 01/23/2020   CALCIUM 9.3 04/02/2020   CALCIUM 8.3 (L) 01/24/2020   CALCIUM 8.3 (L) 01/23/2020   LFT Recent Labs    04/02/20 1248  PROT 7.1  ALBUMIN 3.9  AST 21  ALT 16  ALKPHOS 63  BILITOT 0.9   PT/INR Lab Results  Component Value Date   INR 1.07 05/05/2018   INR 1.04 05/05/2018     RADIOLOGY STUDIES: No results found.  IMPRESSION:   *   Recurrent diverticular bleed. So far no anemia.  However, in past has required transfusion for diverticular associated GI blood loss anemia.  *    Barrett's esophagus.  Omeprazole 40 mg/day at home.    PLAN:     *    It has been 5 or 6 hours since his last episode of hematochezia so the window of opportunity for locating source of bleeding on angiography has likely passed.  Had asked the ED PA to order a CTAP with angiography but after seeing the patient, I have canceled this. However, should he have recurrent aggressive bleeding, by all means we should order a CTAP with angiography.  *     No plans for repeat colonoscopy or EGD.  Will allow regular diet as what he eats, when he eats, if he eats is not going to cause worsening of bleeding tendency.  In fact eating solid stool may help him eliminate any residual blood retained in the colon.  *    Obtain a follow-up CBC in the morning.  Hgb/crit this evening.  *   This is lower GI bleed.  Patient does not need IV Protonix so I stopped IV formulation and put him on Protonix 40 mg p.o. daily.     Jennye MoccasinSarah Gribbin  04/02/2020, 2:39 PM Phone [phone]   Attending physician's note   I have taken an interval history, reviewed the chart and examined the patient. I agree with the Advanced Practitioner's note, impression and recommendations.   Recurrent diverticular bleed (likely)- Hb 12.  Appears to have resolved. Recent colon 01/22/2020 (also d/t diverticular bleed) neg except for pancolonic diverticulosis.  Similar several adm in past.  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 Last EGD 12/2019  Plan: -Only supportive treatment for now. -Advance diet. -Trend CBC. -Continue Protonix -If any active brisk bleeding, CTA abdo/pel with embolization if pos. -If no active bleeding, then can D/C in a.m. on Metamucil/high-fiber diet. -Avoid nonsteroidals   Edman Circleaj Jetson Pickrel, MD [facility].

## 2020-04-02 NOTE — H&P (Signed)
History and Physical    Derrick Thornton XNA:[id number] DOB: [birth year] DOA: 04/02/2020  PCP: System, Provider Not In   Patient coming from: Home  I have personally briefly reviewed patient's old medical records in [facility]  Chief Complaint: Rectal bleed  HPI: Derrick Thornton is a 74 y.o. male with medical history significant for diverticulosis with multiple admissions for diverticular bleed requiring blood transfusions, history of diabetes mellitus, hypertension and Barrett's esophagus who presents to the ER for evaluation of rectal bleeding that he has had for about 3 days.  He states that the bleed is similar to his diverticular bleed and has been scant amount until the day of his admission when he had a large bowel movement containing maroon-colored blood and clots.  He also has discomfort in his left lower quadrant.  He admits to having an episode of dizziness, lightheadedness and shortness of breath after his large bowel movement.  He denies having any falls or any syncopal episodes.  He denies any NSAID use, denies having any fever, no nausea, no vomiting, no urinary symptoms, no cough or shortness of breath. Labs show sodium 135, potassium 3.7, chloride 99, bicarb 22, BUN 19, creatinine 1.6, calcium 9.3, alkaline phosphatase 63, albumin 3.9, AST 21, ALT 16, total protein 7.1, white count 8.2, hemoglobin 12.1, hematocrit 38.5, MCV 99.2, RDW 13.9, platelet count 283, PT 12.7, INR 1.0, hemoglobin A1c 5.5 Hemoccult is positive   ED Course: Patient is a 74 year old male with a history of diverticulosis and frequent admissions for diverticular bleed who presents to the ER for evaluation of rectal bleeding for about 3 days associated with intermittent dizziness, lightheadedness and shortness of breath.  His stools are heme positive and hemoglobin is stable at 12.1.  Will refer patient to observation status for further evaluation.  Review of Systems: As per HPI otherwise 10 point review of  systems negative.    Past Medical History:  Diagnosis Date  . Anemia   . Aortic atherosclerosis (HCC)   . Barrett's esophagus   . DDD (degenerative disc disease), lumbar   . Diabetes mellitus without complication (HCC)   . Diverticulitis   . Diverticulosis   . GI bleed   . Hiatal hernia   . Hyperplastic colon polyp   . Hypertension   . Internal hemorrhoids   . Nephrolithiasis     Past Surgical History:  Procedure Laterality Date  . ACHILLES TENDON REPAIR    . BIOPSY  01/22/2020   Procedure: BIOPSY;  Surgeon: Sherrilyn Rist, MD;  Location: [facility] ENDOSCOPY;  Service: Endoscopy;;  . COLONOSCOPY WITH PROPOFOL N/A 01/22/2020   Procedure: COLONOSCOPY WITH PROPOFOL;  Surgeon: Sherrilyn Rist, MD;  Location: [facility] ENDOSCOPY;  Service: Endoscopy;  Laterality: N/A;  . ESOPHAGOGASTRODUODENOSCOPY (EGD) WITH PROPOFOL N/A 01/22/2020   Procedure: ESOPHAGOGASTRODUODENOSCOPY (EGD) WITH PROPOFOL;  Surgeon: Sherrilyn Rist, MD;  Location: [facility] ENDOSCOPY;  Service: Endoscopy;  Laterality: N/A;  . KNEE ARTHROSCOPY       reports that he has never smoked. He has never used smokeless tobacco. He reports current alcohol use. He reports that he does not use drugs.  No Known Allergies  Family History  Problem Relation Age of Onset  . Stroke Father   . Colon cancer Neg Hx   . Stomach cancer Neg Hx   . Pancreatic cancer Neg Hx   . Esophageal cancer Neg Hx      Prior to Admission medications   Medication Sig Start Date End  Date Taking? Authorizing Provider  Ascorbic Acid (VITAMIN C WITH ROSE HIPS) 500 MG tablet Take 500 mg by mouth daily.   Yes [provider]  glimepiride (AMARYL) 4 MG tablet Take 1-2 mg by mouth daily as needed (for high sugar).    Yes [provider]  metFORMIN (GLUCOPHAGE) 500 MG tablet Take 500 mg by mouth at bedtime. 12/22/19  Yes [provider]  omeprazole (PRILOSEC) 40 MG capsule Take 1 capsule (40 mg total) by mouth daily. 01/24/20  Yes  Simmons-Robinson, Makiera, MD  OVER THE COUNTER MEDICATION Take 1 tablet by mouth daily. Zembright Mood Plus   Yes [provider]  OVER THE COUNTER MEDICATION Take 1 capsule by mouth every other day. BEET POWDER    Yes [provider]  rosuvastatin (CRESTOR) 5 MG tablet Take 2.5 mg by mouth daily.   Yes [provider]  tadalafil (CIALIS) 5 MG tablet Take 5 mg by mouth daily as needed for erectile dysfunction.  08/20/15  Yes [provider]  tamsulosin (FLOMAX) 0.4 MG CAPS capsule Take 0.4 mg by mouth daily.   Yes [provider]    Physical Exam: Vitals:   04/02/20 1330 04/02/20 1345 04/02/20 1400 04/02/20 1415  BP: 127/85 126/87 120/87 124/86  Pulse: (!) 102 97 99 93  Resp:      Temp:      SpO2: 97% 97% 98% 96%  Weight:      Height:         Vitals:   04/02/20 1330 04/02/20 1345 04/02/20 1400 04/02/20 1415  BP: 127/85 126/87 120/87 124/86  Pulse: (!) 102 97 99 93  Resp:      Temp:      SpO2: 97% 97% 98% 96%  Weight:      Height:        Constitutional: NAD, alert and oriented x 3 Eyes: PERRL, lids and conjunctivae normal ENMT: Mucous membranes are moist.  Neck: normal, supple, no masses, no thyromegaly Respiratory: clear to auscultation bilaterally, no wheezing, no crackles. Normal respiratory effort. No accessory muscle use.  Cardiovascular: Regular rate and rhythm, no murmurs / rubs / gallops. No extremity edema. 2+ pedal pulses. No carotid bruits.  Abdomen: Mild left lower quadrant tenderness, no masses palpated. No hepatosplenomegaly. Bowel sounds positive.  Musculoskeletal: no clubbing / cyanosis. No joint deformity upper and lower extremities.  Skin: no rashes, lesions, ulcers.  Neurologic: No gross focal neurologic deficit. Psychiatric: Normal mood and affect.   Labs on Admission: I have personally reviewed following labs and imaging studies  CBC: Recent Labs  Lab 04/02/20 1248  WBC 8.2  HGB 12.1*  HCT 38.5*    MCV 99.2  PLT 283   Basic Metabolic Panel: Recent Labs  Lab 04/02/20 1248  NA 135  K 3.7  CL 99  CO2 22  GLUCOSE 139*  BUN 19  CREATININE 1.16  CALCIUM 9.3   GFR: Estimated Creatinine Clearance: 57.6 mL/min (by C-G formula based on SCr of 1.16 mg/dL). Liver Function Tests: Recent Labs  Lab 04/02/20 1248  AST 21  ALT 16  ALKPHOS 63  BILITOT 0.9  PROT 7.1  ALBUMIN 3.9   No results for input(s): LIPASE, AMYLASE in the last 168 hours. No results for input(s): AMMONIA in the last 168 hours. Coagulation Profile: Recent Labs  Lab 04/02/20 1519  INR 1.0   Cardiac Enzymes: No results for input(s): CKTOTAL, CKMB, CKMBINDEX, TROPONINI in the last 168 hours. BNP (last 3 results) No results for  input(s): PROBNP in the last 8760 hours. HbA1C: Recent Labs    04/02/20 1535  HGBA1C 5.5   CBG: No results for input(s): GLUCAP in the last 168 hours. Lipid Profile: No results for input(s): CHOL, HDL, LDLCALC, TRIG, CHOLHDL, LDLDIRECT in the last 72 hours. Thyroid Function Tests: No results for input(s): TSH, T4TOTAL, FREET4, T3FREE, THYROIDAB in the last 72 hours. Anemia Panel: No results for input(s): VITAMINB12, FOLATE, FERRITIN, TIBC, IRON, RETICCTPCT in the last 72 hours. Urine analysis: No results found for: COLORURINE, APPEARANCEUR, LABSPEC, PHURINE, GLUCOSEU, HGBUR, BILIRUBINUR, KETONESUR, PROTEINUR, UROBILINOGEN, NITRITE, LEUKOCYTESUR  Radiological Exams on Admission: No results found.  EKG: Independently reviewed.     Assessment/Plan Principal Problem:   Rectal bleeding Active Problems:   HTN (hypertension)   Type 2 diabetes mellitus (HCC)   Diverticulosis     Rectal bleed ??  Diverticular bleed Patient has a history of diverticulosis and has been admitted in the past for diverticular bleed Hemoglobin is stable at 12g/dl Check serial H&H and transfuse as needed We will request GI consult   Diabetes mellitus Patient is on a clear liquid  diet Hold oral hypoglycemic agents Sliding scale insulin for glycemic control   Hypertension Patient is normotensive Hold all antihypertensive medications     DVT prophylaxis: SCD Code Status: Full code Family Communication: 50% of time was spent discussing plan of care with patient at the bedside.  He verbalizes understanding and agrees with the plan of care.  All questions and concerns have been addressed. Disposition Plan: Back to previous home environment Consults called: Gastroenterology    Lucile Shutters MD [facility]     04/02/2020, 4:20 PM

## 2020-04-02 NOTE — ED Provider Notes (Signed)
Derrick Thornton   CSN: [id number] Arrival date & time: 04/02/20  1203     History Chief Complaint  Patient presents with  . GI Bleeding    Derrick Thornton is a 74 y.o. male.  Patient with history of diverticulosis and multiple episodes of GI bleeding presents the emergency department with dark red blood being passed per rectum over the past 2 days.  Bleeding has become more pronounced and heavier over the past day.  He has had associated left lower quadrant "aching".  No fever, nausea or vomiting.  Today he feels more tired and reported subjective shortness of breath earlier today.  Also felt like his heart was racing.  No lightheadedness or syncope.  Symptoms are similar to previous episodes.  He denies anticoagulation or use of NSAIDs.  Previous episode of bleeding August 2021.  His hemoglobin dropped precipitously during the stay and he required blood transfusion.  PCP: Dr. Ludwig Thornton per patient report.         Past Medical History:  Diagnosis Date  . Anemia   . Aortic atherosclerosis (HCC)   . Barrett's esophagus   . DDD (degenerative disc disease), lumbar   . Diabetes mellitus without complication (HCC)   . Diverticulitis   . Diverticulosis   . GI bleed   . Hiatal hernia   . Hyperplastic colon polyp   . Hypertension   . Internal hemorrhoids   . Nephrolithiasis     Patient Active Problem List   Diagnosis Date Noted  . Iron deficiency anemia   . Acute GI bleeding 06/15/2019  . Unilateral primary osteoarthritis, right knee 11/08/2018  . Chronic pain of right knee 11/08/2018  . Lower GI bleed 05/05/2018  . Acute blood loss anemia 05/05/2018  . HTN (hypertension) 05/05/2018  . Type 2 diabetes mellitus (HCC) 05/05/2018  . Internal hemorrhoids 05/05/2018  . Diverticulosis 05/05/2018    Past Surgical History:  Procedure Laterality Date  . ACHILLES TENDON REPAIR    . BIOPSY  01/22/2020   Procedure: BIOPSY;   Surgeon: Sherrilyn Rist, MD;  Location: [facility] ENDOSCOPY;  Service: Endoscopy;;  . COLONOSCOPY WITH PROPOFOL N/A 01/22/2020   Procedure: COLONOSCOPY WITH PROPOFOL;  Surgeon: Sherrilyn Rist, MD;  Location: [facility] ENDOSCOPY;  Service: Endoscopy;  Laterality: N/A;  . ESOPHAGOGASTRODUODENOSCOPY (EGD) WITH PROPOFOL N/A 01/22/2020   Procedure: ESOPHAGOGASTRODUODENOSCOPY (EGD) WITH PROPOFOL;  Surgeon: Sherrilyn Rist, MD;  Location: [facility] ENDOSCOPY;  Service: Endoscopy;  Laterality: N/A;  . KNEE ARTHROSCOPY         Family History  Problem Relation Age of Onset  . Stroke Father   . Colon cancer Neg Hx   . Stomach cancer Neg Hx   . Pancreatic cancer Neg Hx   . Esophageal cancer Neg Hx     Social History   Tobacco Use  . Smoking status: Never Smoker  . Smokeless tobacco: Never Used  Vaping Use  . Vaping Use: Never used  Substance Use Topics  . Alcohol use: Yes    Comment: occ  . Drug use: Never    Home Medications Prior to Admission medications   Medication Sig Start Date End Date Taking? Authorizing Provider  Ascorbic Acid (VITAMIN C WITH ROSE HIPS) 500 MG tablet Take 500 mg by mouth daily.   Yes [provider]  glimepiride (AMARYL) 4 MG tablet Take 1-2 mg by mouth daily as needed (for high sugar).    Yes [provider]  metFORMIN (GLUCOPHAGE) 500 MG tablet Take 500 mg by mouth at bedtime. 12/22/19  Yes [provider]  omeprazole (PRILOSEC) 40 MG capsule Take 1 capsule (40 mg total) by mouth daily. 01/24/20  Yes Simmons-Robinson, Makiera, MD  OVER THE COUNTER MEDICATION Take 1 tablet by mouth daily. Zembright Mood Plus   Yes [provider]  OVER THE COUNTER MEDICATION Take 1 capsule by mouth every other day. BEET POWDER    Yes [provider]  rosuvastatin (CRESTOR) 5 MG tablet Take 2.5 mg by mouth daily.   Yes [provider]  tadalafil (CIALIS) 5 MG tablet Take 5 mg by mouth daily as needed for erectile dysfunction.  08/20/15   Yes [provider]  tamsulosin (FLOMAX) 0.4 MG CAPS capsule Take 0.4 mg by mouth daily.   Yes [provider]    Allergies    Patient has no known allergies.  Review of Systems   Review of Systems  Constitutional: Negative for fever.  HENT: Negative for rhinorrhea and sore throat.   Eyes: Negative for redness.  Respiratory: Negative for cough.   Cardiovascular: Positive for palpitations. Negative for chest pain.  Gastrointestinal: Positive for abdominal pain and blood in stool. Negative for diarrhea, nausea and vomiting.  Genitourinary: Negative for dysuria and hematuria.  Musculoskeletal: Negative for myalgias.  Skin: Negative for rash.  Neurological: Positive for light-headedness. Negative for headaches.    Physical Exam Updated Vital Signs BP 123/79   Pulse (!) 130   Temp 97.7 F (36.5 C)   Resp 18   Ht 5\' 9"  (1.753 m)   Wt 79.4 kg   SpO2 99%   BMI 25.84 kg/m   Physical Exam Vitals and nursing Thornton reviewed.  Constitutional:      Appearance: He is well-developed.  HENT:     Head: Normocephalic and atraumatic.  Eyes:     General:        Right eye: No discharge.        Left eye: No discharge.     Conjunctiva/sclera: Conjunctivae normal.  Cardiovascular:     Rate and Rhythm: Normal rate and regular rhythm.     Heart sounds: Normal heart sounds.  Pulmonary:     Effort: Pulmonary effort is normal.     Breath sounds: Normal breath sounds.  Abdominal:     Palpations: Abdomen is soft.     Tenderness: There is abdominal tenderness. There is no guarding or rebound.     Comments: Mild to moderate left lower and left lateral abdominal tenderness to palpation.  Genitourinary:    Rectum: Guaiac result positive. No tenderness or external hemorrhoid.  Musculoskeletal:     Cervical back: Normal range of motion and neck supple.  Skin:    General: Skin is warm and dry.  Neurological:     Mental Status: He is alert.     ED Results / Procedures /  Treatments   Labs (all labs ordered are listed, but only abnormal results are displayed) Labs Reviewed  COMPREHENSIVE METABOLIC PANEL - Abnormal; Notable for the following components:      Result Value   Glucose, Bld 139 (*)    All other components within normal limits  CBC - Abnormal; Notable for the following components:   RBC 3.88 (*)    Hemoglobin 12.1 (*)    HCT 38.5 (*)    All other components within normal limits  POC OCCULT BLOOD, ED - Abnormal; Notable for the following components:   Fecal Occult Bld  POSITIVE (*)    All other components within normal limits  RESPIRATORY PANEL BY RT PCR (FLU A&B, COVID)  PROTIME-INR  HEMOGLOBIN A1C  HEMOGLOBIN  TYPE AND SCREEN    EKG None  Radiology No results found.  Procedures Procedures (including critical care time)  Medications Ordered in ED Medications - No data to display  ED Course  I have reviewed the triage vital signs and the nursing notes.  Pertinent labs & imaging results that were available during my care of the patient were reviewed by me and considered in my medical decision making (see chart for details).  Patient seen and examined. Work-up initiated. Hbg 12.1.   Vital signs reviewed and are as follows: BP 123/79   Pulse (!) 130   Temp 97.7 F (36.5 C)   Resp 18   Ht 5\' 9"  (1.753 m)   Wt 79.4 kg   SpO2 99%   BMI 25.84 kg/m   HR 100-110 during eval.   2:12 PM Pt updated.   2:33 PM Rectal exam performed with APP student chaperone (RN unable). Maroon blood on finger, no large volume bleeding at time of exam.   Spoke with G[place]bin PA-C who will consult. Awaiting reccs.   3:31 PM Spoke with Dr. .     MDM Rules/Calculators/A&P                          Admit, stable, acute GI bleeding.    Final Clinical Impression(s) / ED Diagnoses Final diagnoses:  Lower GI bleed    Rx / DC Orders ED Discharge Orders    None       Joylene Igo, PA-C 04/02/20 1604    [place]egeler, 13/03/21,  MD 04/02/20 1625

## 2020-04-03 DIAGNOSIS — K625 Hemorrhage of anus and rectum: Secondary | ICD-10-CM | POA: Diagnosis not present

## 2020-04-03 LAB — CBC
HCT: 32.1 % — ABNORMAL LOW (ref 39.0–52.0)
Hemoglobin: 10.4 g/dL — ABNORMAL LOW (ref 13.0–17.0)
MCH: 31.2 pg (ref 26.0–34.0)
MCHC: 32.4 g/dL (ref 30.0–36.0)
MCV: 96.4 fL (ref 80.0–100.0)
Platelets: 265 10*3/uL (ref 150–400)
RBC: 3.33 MIL/uL — ABNORMAL LOW (ref 4.22–5.81)
RDW: 13.9 % (ref 11.5–15.5)
WBC: 6.5 10*3/uL (ref 4.0–10.5)
nRBC: 0 % (ref 0.0–0.2)

## 2020-04-03 LAB — BASIC METABOLIC PANEL
Anion gap: 8 (ref 5–15)
BUN: 21 mg/dL (ref 8–23)
CO2: 26 mmol/L (ref 22–32)
Calcium: 8.9 mg/dL (ref 8.9–10.3)
Chloride: 104 mmol/L (ref 98–111)
Creatinine, Ser: 1.12 mg/dL (ref 0.61–1.24)
GFR, Estimated: >60 mL/min (ref >60)
Glucose, Bld: 77 mg/dL (ref 70–99)
Potassium: 3.8 mmol/L (ref 3.5–5.1)
Sodium: 138 mmol/L (ref 135–145)

## 2020-04-03 LAB — GLUCOSE, CAPILLARY: Glucose-Capillary: 118 mg/dL — ABNORMAL HIGH (ref 70–99)

## 2020-04-03 MED ORDER — OMEPRAZOLE 40 MG PO CPDR: 40.0000 mg | DELAYED_RELEASE_CAPSULE | Freq: Every day | ORAL | 0 refills | Status: DC

## 2020-04-03 NOTE — Plan of Care (Signed)

## 2020-04-03 NOTE — Discharge Instructions (Signed)
Lower Gastrointestinal Bleeding  Lower gastrointestinal (GI) bleeding is the result of bleeding from the colon, rectum, or anal area. The colon is the last part of the digestive tract, where stool, also called feces, is formed. If you have lower GI bleeding, you may see blood in or on your stool. It may be bright red. Lower GI bleeding often stops without treatment. Continued or heavy bleeding needs emergency treatment at the hospital. What are the causes? Lower GI bleeding may be caused by:  A condition that causes pouches to form in the colon over time (diverticulosis).  Swelling and irritation (inflammation) in areas with diverticulosis (diverticulitis).  Inflammation of the colon (inflammatory bowel disease).  Swollen veins in the rectum (hemorrhoids).  Painful tears in the anus (anal fissures), often caused by passing hard stools.  Cancer of the colon or rectum.  Noncancerous growths (polyps) of the colon or rectum.  A bleeding disorder that impairs the formation of blood clots and causes easy bleeding (coagulopathy).  An abnormal weakening of a blood vessel where an artery and a vein come together (arteriovenous malformation). What increases the risk? You are more likely to develop this condition if:  You are older than 72 years of age.  You take aspirin or NSAIDs on a regular basis.  You take anticoagulant or antiplatelet drugs.  You have a history of high-dose X-ray treatment (radiation therapy) of the colon.  You recently had a colon polyp removed. What are the signs or symptoms? Symptoms of this condition include:  Bright red blood or blood clots coming from your rectum.  Bloody stools.  Black or maroon-colored stools.  Pain or cramping in the abdomen.  Weakness or dizziness.  Racing heartbeat. How is this diagnosed? This condition may be diagnosed based on:  Your symptoms and medical history.  A physical exam. During the exam, your health care  provider will check for signs of blood loss, such as low blood pressure and a rapid pulse.  Tests, such as: ? Flexible sigmoidoscopy. In this procedure, a flexible tube with a camera on the end is used to examine your anus and the first part of your colon to look for the source of bleeding. ? Colonoscopy. This is similar to a flexible sigmoidoscopy, but the camera can extend all the way to the uppermost part of your colon. ? Blood tests to measure your red blood cell count and to check for coagulopathy. ? An imaging study of your colon to look for a bleeding site. In some cases, you may have X-rays taken after a dye or radioactive substance is injected into your bloodstream (angiogram). How is this treated? Treatment for this condition depends on the cause of the bleeding. Heavy or persistent bleeding is treated at the hospital. Treatment may include:  Getting fluids through an IV tube inserted into one of your veins.  Getting blood through an IV tube (blood transfusion).  Stopping bleeding through high-heat coagulation, injections of certain medicines, or applying surgical clips. This can all be done during a colonoscopy.  Having a procedure that involves first doing an angiogram and then blocking blood flow to the bleeding site (embolization).  Stopping some of your regular medicines for a certain amount of time.  Having surgery to remove part of the colon. This may be needed if bleeding is severe and does not respond to other treatment. Follow these instructions at home:  Take over-the-counter and prescription medicines only as told by your health care provider. You may need to   avoid aspirin, NSAIDs, or other medicines that increase bleeding.  Eat foods that are high in fiber. This will help keep your stools soft. These foods include whole grains, legumes, fruits, and vegetables. Eating 1-3 prunes each day works well for many people.  Drink enough fluid to keep your urine clear or pale  yellow.  Keep all follow-up visits as told by your health care provider. This is important. Contact a health care provider if:  Your symptoms do not improve. Get help right away if:  Your bleeding increases.  You feel light-headed or you faint.  You feel weak.  You have severe cramps in your back or abdomen.  You pass large blood clots in your stool.  Your symptoms get worse. This information is not intended to replace advice given to you by your health care provider. Make sure you discuss any questions you have with your health care provider. Document Revised: 09/08/2018 Document Reviewed: 10/02/2015 [employer] Patient Education  2020 [employer]  

## 2020-04-03 NOTE — Discharge Summary (Signed)
Physician Discharge Summary  Derrick Thornton ZOX:[id number] DOB: 07/09/1945 DOA: 04/02/2020  PCP: System, Provider Not In  Admit date: 04/02/2020 Discharge date: 04/03/2020  Admitted From: Home Disposition: Home  Recommendations for Outpatient Follow-up:  1. Follow up with PCP in 1-2 weeks 2. Follow-up with GI as needed 3. Please obtain BMP/CBC in one week 4. Please follow up with your PCP on the following pending results: Unresulted Labs (From admission, onward)         None       Home Health: None Equipment/Devices: None  Discharge Condition: Stable CODE STATUS: Full code Diet recommendation: Cardiac  Subjective: Seen and examined.  No complaints.  Last hematochezia more than 12 hours ago.  Wants to go home.  HPI: Derrick Thornton is a 74 y.o. male with medical history significant for diverticulosis with multiple admissions for diverticular bleed requiring blood transfusions, history of diabetes mellitus, hypertension and Barrett's esophagus who presents to the ER for evaluation of rectal bleeding that he has had for about 3 days.  He states that the bleed is similar to his diverticular bleed and has been scant amount until the day of his admission when he had a large bowel movement containing maroon-colored blood and clots.  He also has discomfort in his left lower quadrant.  He admits to having an episode of dizziness, lightheadedness and shortness of breath after his large bowel movement.  He denies having any falls or any syncopal episodes.  He denies any NSAID use, denies having any fever, no nausea, no vomiting, no urinary symptoms, no cough or shortness of breath. Labs show sodium 135, potassium 3.7, chloride 99, bicarb 22, BUN 19, creatinine 1.6, calcium 9.3, alkaline phosphatase 63, albumin 3.9, AST 21, ALT 16, total protein 7.1, white count 8.2, hemoglobin 12.1, hematocrit 38.5, MCV 99.2, RDW 13.9, platelet count 283, PT 12.7, INR 1.0, hemoglobin A1c 5.5 Hemoccult is  positive   ED Course: Patient is a 74 year old male with a history of diverticulosis and frequent admissions for diverticular bleed who presents to the ER for evaluation of rectal bleeding for about 3 days associated with intermittent dizziness, lightheadedness and shortness of breath.  His stools are heme positive and hemoglobin is stable at 12.1.  Will refer patient to observation status for further evaluation.  Brief/Interim Summary: Patient was admitted due to recurrent lower GI bleed/hematochezia.  His hemoglobin remained stable.  His last rectal bleeding was more than 12 hours ago.  He was seen by GI yesterday.  They planned on continuing supportive care and obtaining CT angiogram of abdomen and pelvis if he has further rectal bleeding but he did not have that.  No plans for EGD and/or colonoscopy.  I discussed with Dr. Chales Abrahams who has cleared the patient for discharge.  He will be discharged on omeprazole for history of Barrett's esophagus.  Discharge Diagnoses:  Principal Problem:   Rectal bleeding Active Problems:   HTN (hypertension)   Type 2 diabetes mellitus (HCC)   Diverticulosis    Discharge Instructions   Allergies as of 04/03/2020   No Known Allergies     Medication List    STOP taking these medications   glimepiride 4 MG tablet Commonly known as: AMARYL     TAKE these medications   metFORMIN 500 MG tablet Commonly known as: GLUCOPHAGE Take 500 mg by mouth at bedtime.   omeprazole 40 MG capsule Commonly known as: PRILOSEC Take 1 capsule (40 mg total) by mouth daily. What changed: Another medication with  the same name was added. Make sure you understand how and when to take each.   omeprazole 40 MG capsule Commonly known as: PRILOSEC Take 1 capsule (40 mg total) by mouth daily. What changed: You were already taking a medication with the same name, and this prescription was added. Make sure you understand how and when to take each.   OVER THE COUNTER  MEDICATION Take 1 tablet by mouth daily. Zembright Mood Plus   OVER THE COUNTER MEDICATION Take 1 capsule by mouth every other day. BEET POWDER   rosuvastatin 5 MG tablet Commonly known as: CRESTOR Take 2.5 mg by mouth daily.   tadalafil 5 MG tablet Commonly known as: CIALIS Take 5 mg by mouth daily as needed for erectile dysfunction.   tamsulosin 0.4 MG Caps capsule Commonly known as: FLOMAX Take 0.4 mg by mouth daily.   vitamin C with rose hips 500 MG tablet Take 500 mg by mouth daily.       No Known Allergies  Consultations: GI   Procedures/Studies: No results found.   Discharge Exam: Vitals:   04/03/20 0345 04/03/20 0757  BP:  125/75  Pulse: 91 76  Resp: 16 18  Temp: 98.4 F (36.9 C) 97.9 F (36.6 C)  SpO2: 98% 98%   Vitals:   04/02/20 1930 04/02/20 2100 04/03/20 0345 04/03/20 0757  BP: 115/80 (!) 141/78  125/75  Pulse: 96 98 91 76  Resp: 20  16 18   Temp:  98.3 F (36.8 C) 98.4 F (36.9 C) 97.9 F (36.6 C)  TempSrc:  Oral Oral Oral  SpO2: 96% 97% 98% 98%  Weight:   76.8 kg   Height:        General: Pt is alert, awake, not in acute distress Cardiovascular: RRR, S1/S2 +, no rubs, no gallops Respiratory: CTA bilaterally, no wheezing, no rhonchi Abdominal: Soft, NT, ND, bowel sounds + Extremities: no edema, no cyanosis    The results of significant diagnostics from this hospitalization (including imaging, microbiology, ancillary and laboratory) are listed below for reference.     Microbiology: Recent Results (from the past 240 hour(s))  Respiratory Panel by RT PCR (Flu A&B, Covid) - Nasopharyngeal Swab     Status: None   Collection Time: 04/02/20  3:19 PM   Specimen: Nasopharyngeal Swab  Result Value Ref Range Status   SARS Coronavirus 2 by RT PCR NEGATIVE NEGATIVE Final    Comment: (NOTE) SARS-CoV-2 target nucleic acids are NOT DETECTED.  The SARS-CoV-2 RNA is generally detectable in upper respiratoy specimens during the acute phase of  infection. The lowest concentration of SARS-CoV-2 viral copies this assay can detect is 131 copies/mL. A negative result does not preclude SARS-Cov-2 infection and should not be used as the sole basis for treatment or other patient management decisions. A negative result may occur with  improper specimen collection/handling, submission of specimen other than nasopharyngeal swab, presence of viral mutation(s) within the areas targeted by this assay, and inadequate number of viral copies (<131 copies/mL). A negative result must be combined with clinical observations, patient history, and epidemiological information. The expected result is Negative.  Fact Sheet for Patients:  13/03/21  Fact Sheet for Healthcare Providers:  https://www.moore.com/  This test is no t yet approved or cleared by the https://www.young.biz/ FDA and  has been authorized for detection and/or diagnosis of SARS-CoV-2 by FDA under an Emergency Use Authorization (EUA). This EUA will remain  in effect (meaning this test can be used) for the duration of  the COVID-19 declaration under Section 564(b)(1) of the Act, 21 U.S.C. section 360bbb-3(b)(1), unless the authorization is terminated or revoked sooner.     Influenza A by PCR NEGATIVE NEGATIVE Final   Influenza B by PCR NEGATIVE NEGATIVE Final    Comment: (NOTE) The Xpert Xpress SARS-CoV-2/FLU/RSV assay is intended as an aid in  the diagnosis of influenza from Nasopharyngeal swab specimens and  should not be used as a sole basis for treatment. Nasal washings and  aspirates are unacceptable for Xpert Xpress SARS-CoV-2/FLU/RSV  testing.  Fact Sheet for Patients: https://www.moore.com/  Fact Sheet for Healthcare Providers: https://www.young.biz/  This test is not yet approved or cleared by the [place] FDA and  has been authorized for detection and/or diagnosis of SARS-CoV-2  by  FDA under an Emergency Use Authorization (EUA). This EUA will remain  in effect (meaning this test can be used) for the duration of the  Covid-19 declaration under Section 564(b)(1) of the Act, 21  U.S.C. section 360bbb-3(b)(1), unless the authorization is  terminated or revoked. Performed at [facility], [address], [place], [place] [address]      Labs: BNP (last 3 results) No results for input(s): BNP in the last 8760 hours. Basic Metabolic Panel: Recent Labs  Lab 04/02/20 1248 04/03/20 0148  NA 135 138  K 3.7 3.8  CL 99 104  CO2 22 26  GLUCOSE 139* 77  BUN 19 21  CREATININE 1.16 1.12  CALCIUM 9.3 8.9   Liver Function Tests: Recent Labs  Lab 04/02/20 1248  AST 21  ALT 16  ALKPHOS 63  BILITOT 0.9  PROT 7.1  ALBUMIN 3.9   No results for input(s): LIPASE, AMYLASE in the last 168 hours. No results for input(s): AMMONIA in the last 168 hours. CBC: Recent Labs  Lab 04/02/20 1248 04/02/20 1700 04/03/20 0148  WBC 8.2  --  6.5  HGB 12.1* 11.1* 10.4*  HCT 38.5*  --  32.1*  MCV 99.2  --  96.4  PLT 283  --  265   Cardiac Enzymes: No results for input(s): CKTOTAL, CKMB, CKMBINDEX, TROPONINI in the last 168 hours. BNP: Invalid input(s): POCBNP CBG: Recent Labs  Lab 04/02/20 2041 04/03/20 0818  GLUCAP 173* 118*   D-Dimer No results for input(s): DDIMER in the last 72 hours. Hgb A1c Recent Labs    04/02/20 1535  HGBA1C 5.5   Lipid Profile No results for input(s): CHOL, HDL, LDLCALC, TRIG, CHOLHDL, LDLDIRECT in the last 72 hours. Thyroid function studies No results for input(s): TSH, T4TOTAL, T3FREE, THYROIDAB in the last 72 hours.  Invalid input(s): FREET3 Anemia work up No results for input(s): VITAMINB12, FOLATE, FERRITIN, TIBC, IRON, RETICCTPCT in the last 72 hours. Urinalysis No results found for: COLORURINE, APPEARANCEUR, LABSPEC, PHURINE, GLUCOSEU, HGBUR, BILIRUBINUR, KETONESUR, PROTEINUR, UROBILINOGEN, NITRITE,  LEUKOCYTESUR Sepsis Labs Invalid input(s): PROCALCITONIN,  WBC,  LACTICIDVEN Microbiology Recent Results (from the past 240 hour(s))  Respiratory Panel by RT PCR (Flu A&B, Covid) - Nasopharyngeal Swab     Status: None   Collection Time: 04/02/20  3:19 PM   Specimen: Nasopharyngeal Swab  Result Value Ref Range Status   SARS Coronavirus 2 by RT PCR NEGATIVE NEGATIVE Final    Comment: (NOTE) SARS-CoV-2 target nucleic acids are NOT DETECTED.  The SARS-CoV-2 RNA is generally detectable in upper respiratoy specimens during the acute phase of infection. The lowest concentration of SARS-CoV-2 viral copies this assay can detect is 131 copies/mL. A negative result does not preclude SARS-Cov-2  infection and should not be used as the sole basis for treatment or other patient management decisions. A negative result may occur with  improper specimen collection/handling, submission of specimen other than nasopharyngeal swab, presence of viral mutation(s) within the areas targeted by this assay, and inadequate number of viral copies (<131 copies/mL). A negative result must be combined with clinical observations, patient history, and epidemiological information. The expected result is Negative.  Fact Sheet for Patients:  https://www.moore.com/https://www.fda.gov/media/142436/download  Fact Sheet for Healthcare Providers:  https://www.young.biz/https://www.fda.gov/media/142435/download  This test is no t yet approved or cleared by the [place] FDA and  has been authorized for detection and/or diagnosis of SARS-CoV-2 by FDA under an Emergency Use Authorization (EUA). This EUA will remain  in effect (meaning this test can be used) for the duration of the COVID-19 declaration under Section 564(b)(1) of the Act, 21 U.S.C. section 360bbb-3(b)(1), unless the authorization is terminated or revoked sooner.     Influenza A by PCR NEGATIVE NEGATIVE Final   Influenza B by PCR NEGATIVE NEGATIVE Final    Comment: (NOTE) The Xpert Xpress  SARS-CoV-2/FLU/RSV assay is intended as an aid in  the diagnosis of influenza from Nasopharyngeal swab specimens and  should not be used as a sole basis for treatment. Nasal washings and  aspirates are unacceptable for Xpert Xpress SARS-CoV-2/FLU/RSV  testing.  Fact Sheet for Patients: https://www.moore.com/https://www.fda.gov/media/142436/download  Fact Sheet for Healthcare Providers: https://www.young.biz/https://www.fda.gov/media/142435/download  This test is not yet approved or cleared by the [place] FDA and  has been authorized for detection and/or diagnosis of SARS-CoV-2 by  FDA under an Emergency Use Authorization (EUA). This EUA will remain  in effect (meaning this test can be used) for the duration of the  Covid-19 declaration under Section 564(b)(1) of the Act, 21  U.S.C. section 360bbb-3(b)(1), unless the authorization is  terminated or revoked. Performed at [facility], [address] [place], [place] [id number]      Time coordinating discharge: Over 30 minutes  SIGNED:   Hughie Clossavi Corbyn Wildey, MD  [facility] 04/03/2020, 9:50 AM  If 7PM-7AM, please contact night-coverage [email or web address]

## 2021-04-06 ENCOUNTER — Ambulatory Visit: Payer: Self-pay | Admitting: Surgery

## 2021-04-06 IMAGING — CT CT CTA ABD/PEL W/CM AND/OR W/O CM
3 of 10 series · 11 of 46 positions shown, 17 images · IV contrast (omnipaque)
Comparison: 01/20/2020, 06/15/2019

CLINICAL DATA: 74-year-old male with melena

EXAM:
CTA ABDOMEN AND PELVIS WITHOUT AND WITH CONTRAST
TECHNIQUE: Multidetector CT imaging of the abdomen and pelvis was performed
using the standard protocol during bolus administration of
intravenous contrast. Multiplanar reconstructed images and MIPs were
obtained and reviewed to evaluate the vascular anatomy.
CONTRAST:  100mL OMNIPAQUE IOHEXOL 350 MG/ML SOLN

[Series 7: arterial 3.0 · axial · arterial · 0.77mm/px · z∈[+1057,+1252]mm · 5 of 147 slices shown]
[im 17/147  soft-tissue]
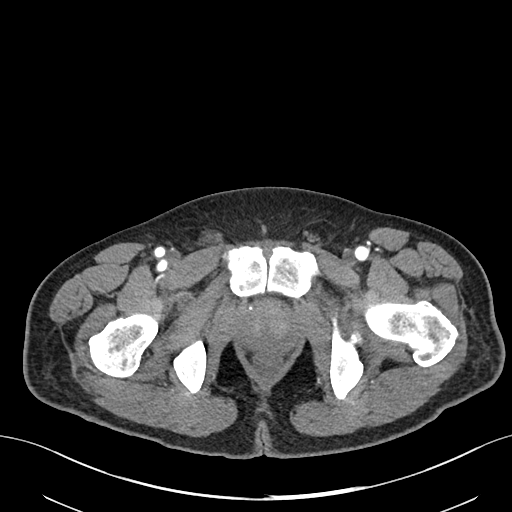
[im 33/147  soft-tissue]
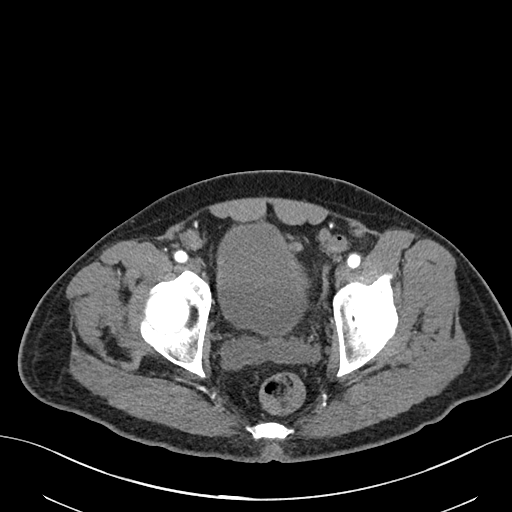
[im 49/147  soft-tissue]
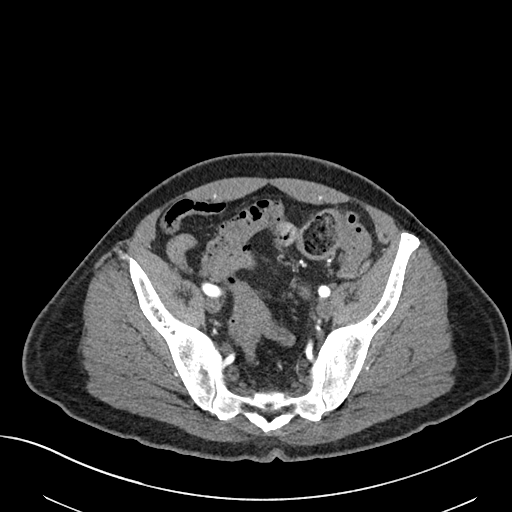
[im 65/147  soft-tissue]
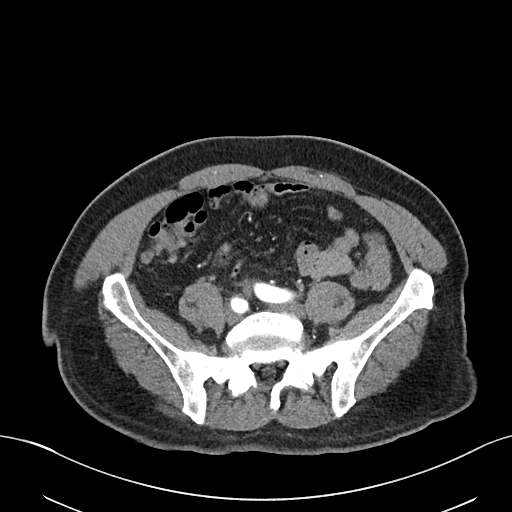
[im 82/147  soft-tissue]
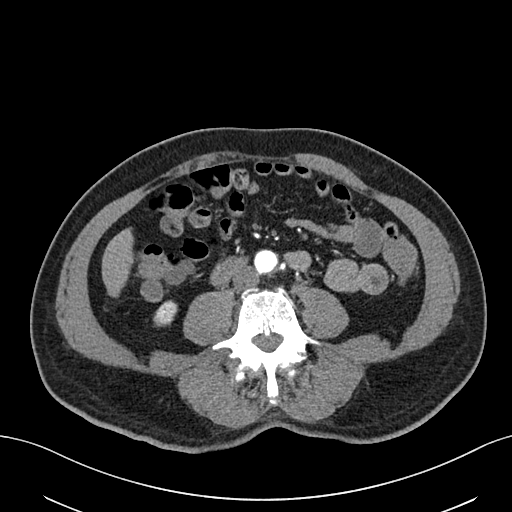

[Series 8: portal venous · axial · portal-venous · 0.77mm/px · z∈[+1092,+1357]mm · 4 of 89 slices shown, 9 images]
[im 18/89  soft-tissue]
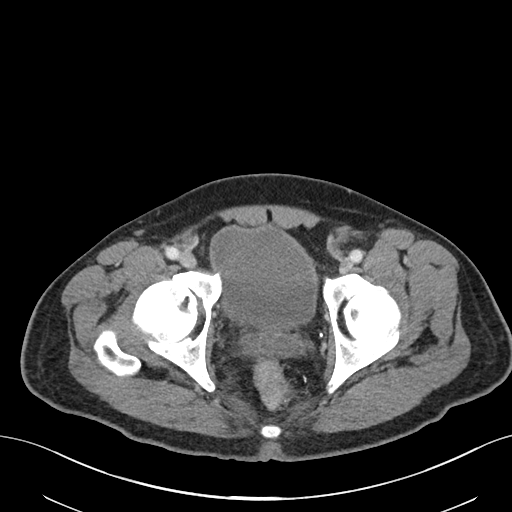
[im 18/89  lung]
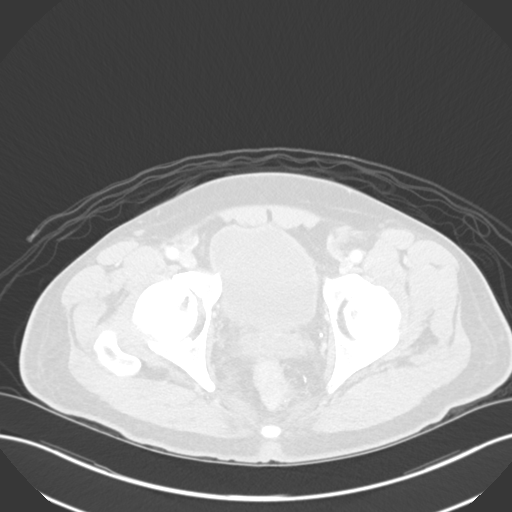
[im 18/89  bone]
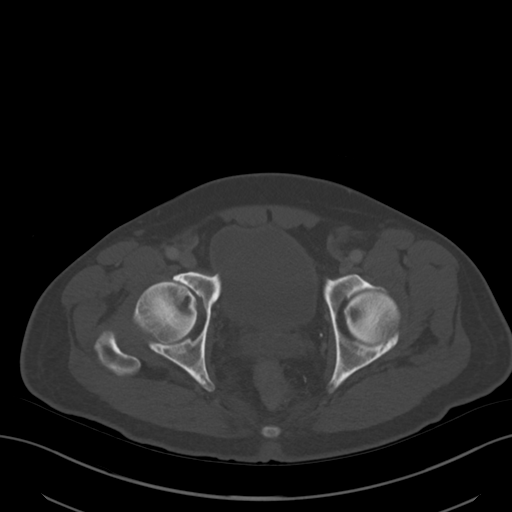
[im 36/89  soft-tissue]
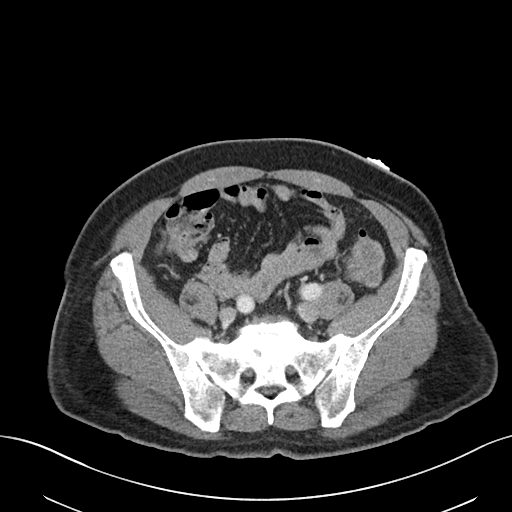
[im 36/89  lung]
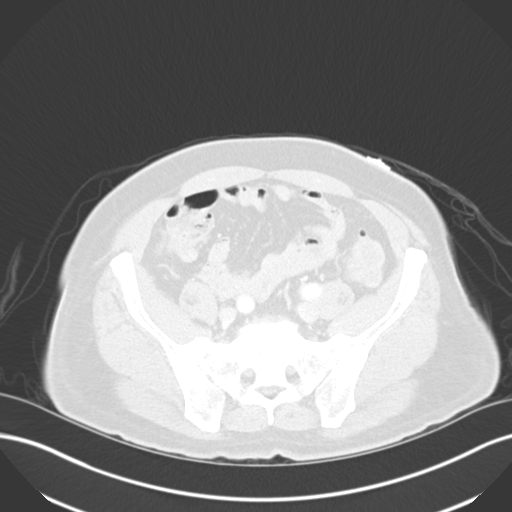
[im 53/89  soft-tissue]
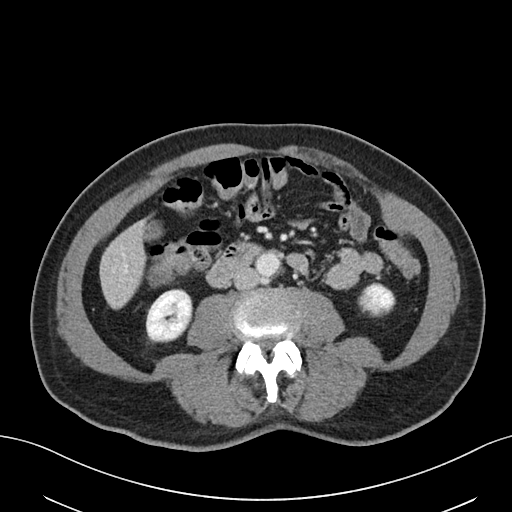
[im 53/89  lung]
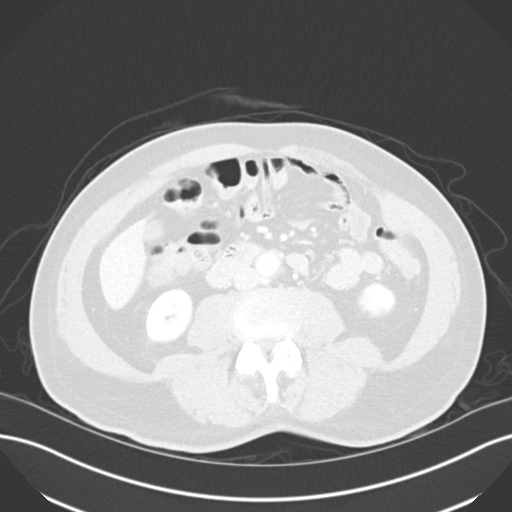
[im 71/89  soft-tissue]
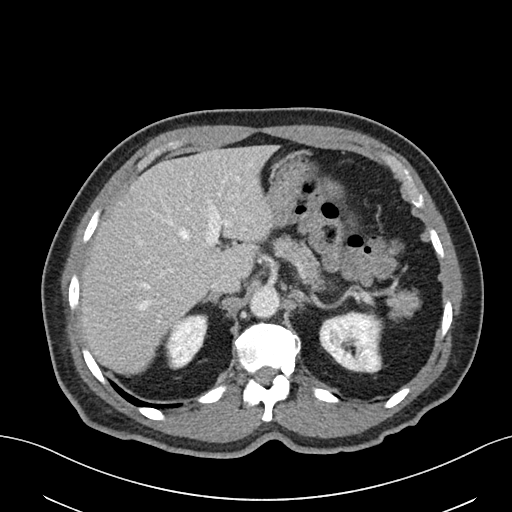
[im 71/89  lung]
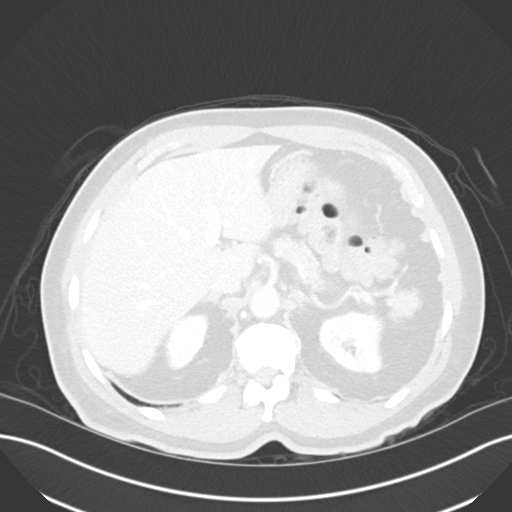

[Series 10: arterial cor · coronal · arterial · 0.77mm/px · 2 of 151 slices shown, 3 images]
[im 51/151  soft-tissue]
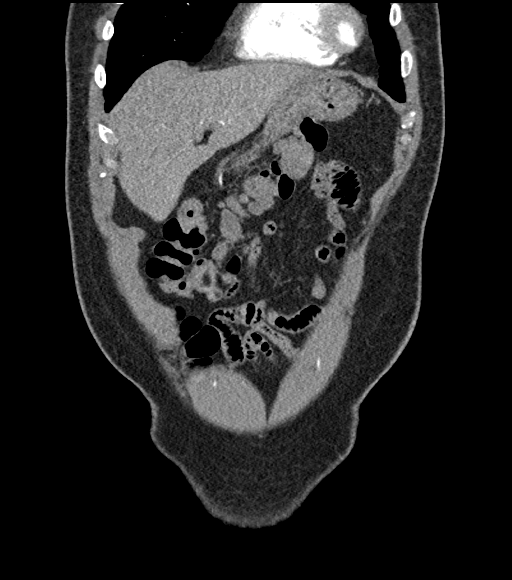
[im 51/151  bone]
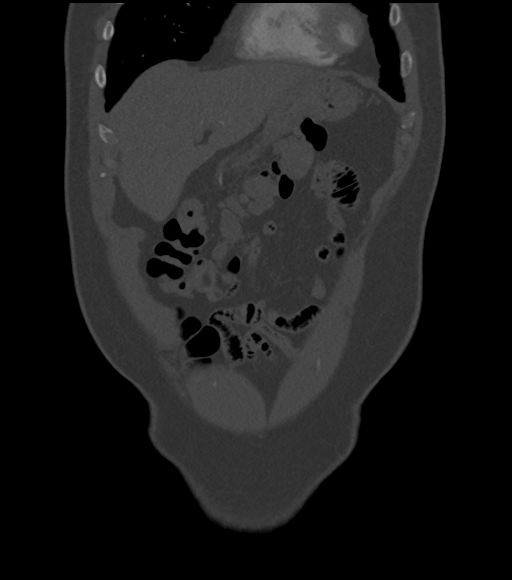
[im 101/151  soft-tissue]
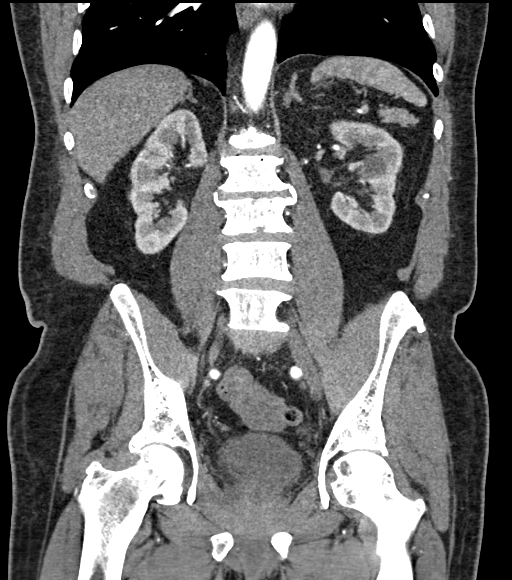

[11 of 46 positions shown; findings below may reference images not displayed]

FINDINGS: VASCULAR

Aorta: Unremarkable course, caliber, contour of the abdominal aorta.
No dissection, aneurysm, or periaortic fluid.

Celiac: Patent, with no significant atherosclerotic changes.

SMA: Patent, with no significant atherosclerotic changes.

Renals:

- Right: Right renal artery patent.

- Left: Left renal artery patent.

IMA: Inferior mesenteric artery is patent.

Right lower extremity:

Unremarkable course, caliber, and contour of the right iliac system.
No aneurysm, dissection, or occlusion. Hypogastric artery is patent.
Common femoral artery patent. Proximal SFA and profunda femoris
patent.

Left lower extremity:

Unremarkable course, caliber, and contour of the left iliac system.
No aneurysm, dissection, or occlusion. Hypogastric artery is patent.
Common femoral artery patent. Proximal SFA and profunda femoris
patent.

Veins: Unremarkable appearance of the venous system.

Review of the MIP images confirms the above findings.

NON-VASCULAR

Lower chest: No acute.

Hepatobiliary: Unremarkable appearance of the liver. Hyperdense
material layered in the dependent gallbladder, potentially
sludge/microlithiasis.

Pancreas: Unremarkable.

Spleen: Unremarkable.

Adrenals/Urinary Tract:

- Right adrenal gland: Unremarkable

- Left adrenal gland: Unremarkable.

- Right kidney: No hydronephrosis, nephrolithiasis, inflammation, or
ureteral dilation. No focal lesion.

- Left Kidney: No hydronephrosis. Stone in the lower pole collecting
system measures 11 mm, nonobstructive. Low-density/nonenhancing
cystic lesion on the medial cortex. Low-density cystic lesion in the
inferior cortex.

- Urinary Bladder: Unremarkable.

Stomach/Bowel:

- Stomach: Small hiatal hernia.  Otherwise unremarkable stomach

- Small bowel: Unremarkable

- Appendix: Appendix is not visualized, however, no inflammatory
changes are present adjacent to the cecum to indicate an
appendicitis.

- Colon: Colonic diverticula throughout the colon. No focal
inflammatory changes. No pooling of contrast or air-fluid level to
indicate a site of GI hemorrhage. No significant stool burden.

Lymphatic: No adenopathy.

Mesenteric: No free fluid or air. No mesenteric adenopathy.

Reproductive: Transverse diameter of the prostate measures 52 mm.

Other: Small fat containing umbilical hernia.

Musculoskeletal: No acute displaced fracture. Degenerative changes
of the spine. No bony canal narrowing. Mild degenerative changes of
the hips.
IMPRESSION: CT is negative for evidence of gastrointestinal hemorrhage.

Extensive colonic diverticula again demonstrated with no evidence of
acute diverticulitis.

Additional ancillary findings as above.

## 2021-04-27 ENCOUNTER — Other Ambulatory Visit: Payer: Self-pay

## 2021-04-27 ENCOUNTER — Encounter (HOSPITAL_BASED_OUTPATIENT_CLINIC_OR_DEPARTMENT_OTHER): Payer: Self-pay | Admitting: Surgery

## 2021-04-30 ENCOUNTER — Encounter (HOSPITAL_BASED_OUTPATIENT_CLINIC_OR_DEPARTMENT_OTHER)
Admission: RE | Admit: 2021-04-30 | Discharge: 2021-04-30 | Disposition: A | Discharge status: Home / Self Care | Payer: Medicare Other | Source: Ambulatory Visit | Attending: Surgery | Admitting: Surgery

## 2021-04-30 DIAGNOSIS — E119 Type 2 diabetes mellitus without complications: Secondary | ICD-10-CM | POA: Diagnosis not present

## 2021-04-30 DIAGNOSIS — Z01818 Encounter for other preprocedural examination: Secondary | ICD-10-CM | POA: Insufficient documentation

## 2021-04-30 DIAGNOSIS — D649 Anemia, unspecified: Secondary | ICD-10-CM | POA: Diagnosis not present

## 2021-04-30 LAB — CBC
HCT: 40.6 % (ref 39.0–52.0)
Hemoglobin: 13.3 g/dL (ref 13.0–17.0)
MCH: 32.7 pg (ref 26.0–34.0)
MCHC: 32.8 g/dL (ref 30.0–36.0)
MCV: 99.8 fL (ref 80.0–100.0)
Platelets: 287 10*3/uL (ref 150–400)
RBC: 4.07 MIL/uL — ABNORMAL LOW (ref 4.22–5.81)
RDW: 11.7 % (ref 11.5–15.5)
WBC: 6.5 10*3/uL (ref 4.0–10.5)
nRBC: 0 % (ref 0.0–0.2)

## 2021-04-30 LAB — BASIC METABOLIC PANEL
Anion gap: 6 (ref 5–15)
BUN: 15 mg/dL (ref 8–23)
CO2: 29 mmol/L (ref 22–32)
Calcium: 9.5 mg/dL (ref 8.9–10.3)
Chloride: 103 mmol/L (ref 98–111)
Creatinine, Ser: 1.03 mg/dL (ref 0.61–1.24)
GFR, Estimated: >60 mL/min (ref >60)
Glucose, Bld: 102 mg/dL — ABNORMAL HIGH (ref 70–99)
Potassium: 5 mmol/L (ref 3.5–5.1)
Sodium: 138 mmol/L (ref 135–145)

## 2021-04-30 NOTE — Progress Notes (Signed)

## 2021-05-05 ENCOUNTER — Other Ambulatory Visit: Payer: Self-pay

## 2021-05-05 ENCOUNTER — Ambulatory Visit (HOSPITAL_BASED_OUTPATIENT_CLINIC_OR_DEPARTMENT_OTHER)
Admission: RE | Admit: 2021-05-05 | Discharge: 2021-05-05 | Disposition: A | Discharge status: Home / Self Care | Payer: Medicare Other | Attending: Surgery | Admitting: Surgery

## 2021-05-05 ENCOUNTER — Ambulatory Visit (HOSPITAL_BASED_OUTPATIENT_CLINIC_OR_DEPARTMENT_OTHER): Payer: Medicare Other | Admitting: Anesthesiology

## 2021-05-05 ENCOUNTER — Encounter (HOSPITAL_BASED_OUTPATIENT_CLINIC_OR_DEPARTMENT_OTHER)
Admission: RE | Disposition: A | Discharge status: Home / Self Care | Payer: Self-pay | Source: Home / Self Care | Attending: Surgery

## 2021-05-05 ENCOUNTER — Encounter (HOSPITAL_BASED_OUTPATIENT_CLINIC_OR_DEPARTMENT_OTHER): Payer: Self-pay | Admitting: Surgery

## 2021-05-05 DIAGNOSIS — E119 Type 2 diabetes mellitus without complications: Secondary | ICD-10-CM | POA: Diagnosis not present

## 2021-05-05 DIAGNOSIS — K449 Diaphragmatic hernia without obstruction or gangrene: Secondary | ICD-10-CM | POA: Diagnosis not present

## 2021-05-05 DIAGNOSIS — I10 Essential (primary) hypertension: Secondary | ICD-10-CM | POA: Diagnosis not present

## 2021-05-05 DIAGNOSIS — Z7984 Long term (current) use of oral hypoglycemic drugs: Secondary | ICD-10-CM | POA: Insufficient documentation

## 2021-05-05 DIAGNOSIS — K4090 Unilateral inguinal hernia, without obstruction or gangrene, not specified as recurrent: Secondary | ICD-10-CM | POA: Insufficient documentation

## 2021-05-05 DIAGNOSIS — D649 Anemia, unspecified: Secondary | ICD-10-CM

## 2021-05-05 HISTORY — PX: INGUINAL HERNIA REPAIR: SHX194

## 2021-05-05 LAB — GLUCOSE, CAPILLARY
Glucose-Capillary: 101 mg/dL — ABNORMAL HIGH (ref 70–99)
Glucose-Capillary: 96 mg/dL (ref 70–99)

## 2021-05-05 SURGERY — REPAIR, HERNIA, INGUINAL, ADULT
Anesthesia: Regional | Site: Groin | Laterality: Left

## 2021-05-05 MED ORDER — IBUPROFEN 800 MG PO TABS: 800.0000 mg | ORAL_TABLET | Freq: Three times a day (TID) | ORAL | 0 refills | Status: DC | PRN

## 2021-05-05 MED ORDER — MIDAZOLAM HCL 2 MG/2ML IJ SOLN
INTRAMUSCULAR | Status: AC
  Filled 2021-05-05: qty 2

## 2021-05-05 MED ORDER — PHENYLEPHRINE 40 MCG/ML (10ML) SYRINGE FOR IV PUSH (FOR BLOOD PRESSURE SUPPORT)
PREFILLED_SYRINGE | INTRAVENOUS | Status: AC
  Filled 2021-05-05: qty 10

## 2021-05-05 MED ORDER — FENTANYL CITRATE (PF) 100 MCG/2ML IJ SOLN
INTRAMUSCULAR | Status: AC
  Filled 2021-05-05: qty 2

## 2021-05-05 MED ORDER — ONDANSETRON HCL 4 MG/2ML IJ SOLN
INTRAMUSCULAR | Status: AC
  Filled 2021-05-05: qty 2

## 2021-05-05 MED ORDER — ACETAMINOPHEN 500 MG PO TABS
1000.0000 mg | ORAL_TABLET | Freq: Four times a day (QID) | ORAL | Status: DC | PRN
  Administered 2021-05-05: 1000 mg via ORAL

## 2021-05-05 MED ORDER — CEFAZOLIN IN SODIUM CHLORIDE 3-0.9 GM/100ML-% IV SOLN
3.0000 g | INTRAVENOUS | Status: AC
  Administered 2021-05-05: 2 g via INTRAVENOUS

## 2021-05-05 MED ORDER — PHENYLEPHRINE HCL (PRESSORS) 10 MG/ML IV SOLN
INTRAVENOUS | Status: DC | PRN
  Administered 2021-05-05 (×2): 80 ug via INTRAVENOUS
  Administered 2021-05-05: 160 ug via INTRAVENOUS
  Administered 2021-05-05 (×2): 80 ug via INTRAVENOUS

## 2021-05-05 MED ORDER — LIDOCAINE 2% (20 MG/ML) 5 ML SYRINGE
INTRAMUSCULAR | Status: AC
  Filled 2021-05-05: qty 5

## 2021-05-05 MED ORDER — CHLORHEXIDINE GLUCONATE CLOTH 2 % EX PADS: 6.0000 | MEDICATED_PAD | Freq: Once | CUTANEOUS | Status: DC

## 2021-05-05 MED ORDER — FENTANYL CITRATE (PF) 100 MCG/2ML IJ SOLN
25.0000 ug | INTRAMUSCULAR | Status: DC | PRN
  Administered 2021-05-05 (×2): 25 ug via INTRAVENOUS

## 2021-05-05 MED ORDER — PROPOFOL 10 MG/ML IV BOLUS
INTRAVENOUS | Status: AC
  Filled 2021-05-05: qty 20

## 2021-05-05 MED ORDER — LIDOCAINE 2% (20 MG/ML) 5 ML SYRINGE
INTRAMUSCULAR | Status: DC | PRN
  Administered 2021-05-05: 40 mg via INTRAVENOUS

## 2021-05-05 MED ORDER — BUPIVACAINE-EPINEPHRINE 0.25% -1:200000 IJ SOLN
INTRAMUSCULAR | Status: DC | PRN
  Administered 2021-05-05: 10 mL

## 2021-05-05 MED ORDER — LACTATED RINGERS IV SOLN: INTRAVENOUS | Status: DC

## 2021-05-05 MED ORDER — CEFAZOLIN SODIUM-DEXTROSE 2-4 GM/100ML-% IV SOLN
INTRAVENOUS | Status: AC
  Filled 2021-05-05: qty 100

## 2021-05-05 MED ORDER — FENTANYL CITRATE (PF) 100 MCG/2ML IJ SOLN
INTRAMUSCULAR | Status: DC | PRN
  Administered 2021-05-05: 50 ug via INTRAVENOUS

## 2021-05-05 MED ORDER — ONDANSETRON HCL 4 MG/2ML IJ SOLN
INTRAMUSCULAR | Status: DC | PRN
  Administered 2021-05-05: 4 mg via INTRAVENOUS

## 2021-05-05 MED ORDER — ROPIVACAINE HCL 5 MG/ML IJ SOLN
INTRAMUSCULAR | Status: DC | PRN
  Administered 2021-05-05: 30 mL via PERINEURAL

## 2021-05-05 MED ORDER — DEXAMETHASONE SODIUM PHOSPHATE 10 MG/ML IJ SOLN
INTRAMUSCULAR | Status: DC | PRN
  Administered 2021-05-05: 5 mg

## 2021-05-05 MED ORDER — FENTANYL CITRATE (PF) 100 MCG/2ML IJ SOLN
100.0000 ug | Freq: Once | INTRAMUSCULAR | Status: AC
  Administered 2021-05-05: 100 ug via INTRAVENOUS

## 2021-05-05 MED ORDER — OXYCODONE HCL 5 MG PO TABS: 5.0000 mg | ORAL_TABLET | Freq: Four times a day (QID) | ORAL | 0 refills | Status: DC | PRN

## 2021-05-05 MED ORDER — MIDAZOLAM HCL 2 MG/2ML IJ SOLN
2.0000 mg | Freq: Once | INTRAMUSCULAR | Status: AC
  Administered 2021-05-05: 2 mg via INTRAVENOUS

## 2021-05-05 MED ORDER — DEXAMETHASONE SODIUM PHOSPHATE 4 MG/ML IJ SOLN
INTRAMUSCULAR | Status: DC | PRN
  Administered 2021-05-05: 4 mg via INTRAVENOUS

## 2021-05-05 MED ORDER — PROPOFOL 10 MG/ML IV BOLUS
INTRAVENOUS | Status: DC | PRN
  Administered 2021-05-05: 200 mg via INTRAVENOUS

## 2021-05-05 SURGICAL SUPPLY — 52 items
ADH SKN CLS APL DERMABOND .7 (GAUZE/BANDAGES/DRESSINGS) ×1
APL PRP STRL LF DISP 70% ISPRP (MISCELLANEOUS) ×1
BLADE CLIPPER SURG (BLADE) ×2 IMPLANT
BLADE SURG 15 STRL LF DISP TIS (BLADE) ×1 IMPLANT
BLADE SURG 15 STRL SS (BLADE) ×2
CANISTER SUCT 1200ML W/VALVE (MISCELLANEOUS) IMPLANT
CHLORAPREP W/TINT 26 (MISCELLANEOUS) ×2 IMPLANT
COVER BACK TABLE 60X90IN (DRAPES) ×2 IMPLANT
COVER MAYO STAND STRL (DRAPES) ×2 IMPLANT
DECANTER SPIKE VIAL GLASS SM (MISCELLANEOUS) IMPLANT
DERMABOND ADVANCED (GAUZE/BANDAGES/DRESSINGS) ×1
DERMABOND ADVANCED .7 DNX12 (GAUZE/BANDAGES/DRESSINGS) ×1 IMPLANT
DRAIN PENROSE 1/2X12 LTX STRL (WOUND CARE) ×2 IMPLANT
DRAPE LAPAROTOMY TRNSV 102X78 (DRAPES) ×2 IMPLANT
DRAPE UTILITY XL STRL (DRAPES) ×2 IMPLANT
ELECT COATED BLADE 2.86 ST (ELECTRODE) ×2 IMPLANT
ELECT REM PT RETURN 9FT ADLT (ELECTROSURGICAL) ×2
ELECTRODE REM PT RTRN 9FT ADLT (ELECTROSURGICAL) ×1 IMPLANT
GAUZE 4X4 16PLY ~~LOC~~+RFID DBL (SPONGE) ×2 IMPLANT
GAUZE SPONGE 4X4 12PLY STRL LF (GAUZE/BANDAGES/DRESSINGS) IMPLANT
GLOVE SRG 8 PF TXTR STRL LF DI (GLOVE) ×1 IMPLANT
GLOVE SURG LTX SZ8 (GLOVE) ×2 IMPLANT
GLOVE SURG POLYISO LF SZ6.5 (GLOVE) ×2 IMPLANT
GLOVE SURG POLYISO LF SZ7 (GLOVE) ×2 IMPLANT
GLOVE SURG UNDER POLY LF SZ7.5 (GLOVE) ×2 IMPLANT
GLOVE SURG UNDER POLY LF SZ8 (GLOVE) ×2
GOWN STRL REUS W/ TWL LRG LVL3 (GOWN DISPOSABLE) ×2 IMPLANT
GOWN STRL REUS W/ TWL XL LVL3 (GOWN DISPOSABLE) ×1 IMPLANT
GOWN STRL REUS W/TWL LRG LVL3 (GOWN DISPOSABLE) ×4
GOWN STRL REUS W/TWL XL LVL3 (GOWN DISPOSABLE) ×2
MESH HERNIA SYS ULTRAPRO LRG (Mesh General) ×2 IMPLANT
NEEDLE HYPO 25X1 1.5 SAFETY (NEEDLE) ×2 IMPLANT
NS IRRIG 1000ML POUR BTL (IV SOLUTION) ×2 IMPLANT
PACK BASIN DAY SURGERY FS (CUSTOM PROCEDURE TRAY) ×2 IMPLANT
PENCIL SMOKE EVACUATOR (MISCELLANEOUS) ×2 IMPLANT
SLEEVE SCD COMPRESS KNEE MED (STOCKING) ×2 IMPLANT
SPONGE T-LAP 4X18 ~~LOC~~+RFID (SPONGE) IMPLANT
STRIP CLOSURE SKIN 1/2X4 (GAUZE/BANDAGES/DRESSINGS) IMPLANT
SUT MON AB 4-0 PC3 18 (SUTURE) ×2 IMPLANT
SUT NOVA 0 T19/GS 22DT (SUTURE) IMPLANT
SUT NOVA NAB DX-16 0-1 5-0 T12 (SUTURE) ×4 IMPLANT
SUT VIC AB 2-0 SH 27 (SUTURE) ×2
SUT VIC AB 2-0 SH 27XBRD (SUTURE) ×1 IMPLANT
SUT VIC AB 3-0 54X BRD REEL (SUTURE) IMPLANT
SUT VIC AB 3-0 BRD 54 (SUTURE)
SUT VICRYL 3-0 CR8 SH (SUTURE) ×2 IMPLANT
SUT VICRYL AB 2 0 TIE (SUTURE) IMPLANT
SUT VICRYL AB 2 0 TIES (SUTURE)
SYR CONTROL 10ML LL (SYRINGE) ×2 IMPLANT
TOWEL GREEN STERILE FF (TOWEL DISPOSABLE) ×2 IMPLANT
TUBE CONNECTING 20X1/4 (TUBING) IMPLANT
YANKAUER SUCT BULB TIP NO VENT (SUCTIONS) IMPLANT

## 2021-05-05 NOTE — Anesthesia Preprocedure Evaluation (Addendum)
Anesthesia Evaluation  Patient identified by MRN, date of birth, ID band Patient awake    Reviewed: Allergy & Precautions, NPO status , Patient's Chart, lab work & pertinent test results  Airway Mallampati: I  TM Distance: >3 FB Neck ROM: Full    Dental no notable dental hx. (+) Teeth Intact, Dental Advisory Given   Pulmonary neg pulmonary ROS,    Pulmonary exam normal breath sounds clear to auscultation       Cardiovascular hypertension, negative cardio ROS Normal cardiovascular exam Rhythm:Regular Rate:Normal     Neuro/Psych negative neurological ROS  negative psych ROS   GI/Hepatic Neg liver ROS, hiatal hernia, GERD  ,  Endo/Other  diabetes, Type 2, Oral Hypoglycemic Agents  Renal/GU negative Renal ROS  negative genitourinary   Musculoskeletal  (+) Arthritis ,   Abdominal   Peds  Hematology negative hematology ROS (+)   Anesthesia Other Findings   Reproductive/Obstetrics                            Anesthesia Physical Anesthesia Plan  ASA: 2  Anesthesia Plan: General and Regional   Post-op Pain Management: Regional block and Tylenol PO (pre-op)   Induction: Intravenous  PONV Risk Score and Plan: 2 and Ondansetron, Dexamethasone and Midazolam  Airway Management Planned: LMA  Additional Equipment:   Intra-op Plan:   Post-operative Plan: Extubation in OR  Informed Consent: I have reviewed the patients History and Physical, chart, labs and discussed the procedure including the risks, benefits and alternatives for the proposed anesthesia with the patient or authorized representative who has indicated his/her understanding and acceptance.     Dental advisory given  Plan Discussed with: CRNA  Anesthesia Plan Comments:         Anesthesia Quick Evaluation

## 2021-05-05 NOTE — Anesthesia Procedure Notes (Signed)
Anesthesia Regional Block: Quadratus lumborum   Pre-Anesthetic Checklist: , timeout performed,  Correct Patient, Correct Site, Correct Laterality,  Correct Procedure, Correct Position, site marked,  Risks and benefits discussed,  Surgical consent,  Pre-op evaluation,  At surgeon's request and post-op pain management  Laterality: Left  Prep: Maximum Sterile Barrier Precautions used, chloraprep       Needles:  Injection technique: Single-shot  Needle Type: Echogenic Stimulator Needle     Needle Length: 9cm  Needle Gauge: 22     Additional Needles:   Procedures:,,,, ultrasound used (permanent image in chart),,    Narrative:  Start time: 05/05/2021 1:20 PM End time: 05/05/2021 1:24 PM Injection made incrementally with aspirations every 5 mL.  Performed by: Personally  Anesthesiologist: Elmer Picker, MD  Additional Notes: Monitors applied. No increased pain on injection. No increased resistance to injection. Injection made in 5cc increments. Good needle visualization. Patient tolerated procedure well.

## 2021-05-05 NOTE — Transfer of Care (Signed)
Immediate Anesthesia Transfer of Care Note  Patient: [name]  Procedure(s) Performed: OPEN LEFT INGUINAL HERNIA REPAIR (Left: Groin)  Patient Location: PACU  Anesthesia Type:General and Regional  Level of Consciousness: drowsy  Airway & Oxygen Therapy: Patient Spontanous Breathing and Patient connected to face mask oxygen  Post-op Assessment: Report given to RN and Post -op Vital signs reviewed and stable  Post vital signs: Reviewed and stable  Last Vitals:  Vitals Value Taken Time  BP    Temp    Pulse 64 05/05/21 1524  Resp 15 05/05/21 1524  SpO2 98 % 05/05/21 1524  Vitals shown include unvalidated device data.  Last Pain:  Vitals:   05/05/21 1147  TempSrc: Oral  PainSc: 0-No pain      Patients Stated Pain Goal: 4 (05/05/21 1147)  Complications: No notable events documented.

## 2021-05-05 NOTE — Anesthesia Procedure Notes (Signed)
Procedure Name: LMA Insertion Date/Time: 05/05/2021 1:55 PM Performed by: Alford Highland, CRNA Pre-anesthesia Checklist: Patient identified, Emergency Drugs available, Suction available and Patient being monitored Patient Re-evaluated:Patient Re-evaluated prior to induction Oxygen Delivery Method: Circle System Utilized Preoxygenation: Pre-oxygenation with 100% oxygen Induction Type: IV induction Ventilation: Mask ventilation without difficulty LMA: LMA inserted LMA Size: 4.0 Number of attempts: 1 Airway Equipment and Method: Bite block Placement Confirmation: positive ETCO2 Tube secured with: Tape Dental Injury: Teeth and Oropharynx as per pre-operative assessment

## 2021-05-05 NOTE — Discharge Instructions (Addendum)
[facility] _______[facility]  UMBILICAL OR INGUINAL HERNIA REPAIR: POST OP INSTRUCTIONS  Always review your discharge instruction sheet given to you by the facility where your surgery was performed. IF YOU HAVE DISABILITY OR FAMILY LEAVE FORMS, YOU MUST BRING THEM TO THE OFFICE FOR PROCESSING.   DO NOT GIVE THEM TO YOUR DOCTOR.  1. A  prescription for pain medication may be given to you upon discharge.  Take your pain medication as prescribed, if needed.  If narcotic pain medicine is not needed, then you may take acetaminophen (Tylenol) or ibuprofen (Advil) as needed. 2. Take your usually prescribed medications unless otherwise directed. If you need a refill on your pain medication, please contact your pharmacy.  They will contact our office to request authorization. Prescriptions will not be filled after 5 pm or on week-ends. 3. You should follow a light diet the first 24 hours after arrival home, such as soup and crackers, etc.  Be sure to include lots of fluids daily.  Resume your normal diet the day after surgery. 4.Most patients will experience some swelling and bruising around the umbilicus or in the groin and scrotum.  Ice packs and reclining will help.  Swelling and bruising can take several days to resolve.  6. It is common to experience some constipation if taking pain medication after surgery.  Increasing fluid intake and taking a stool softener (such as Colace) will usually help or prevent this problem from occurring.  A mild laxative (Milk of Magnesia or Miralax) should be taken according to package directions if there are no bowel movements after 48 hours. 7. Unless discharge instructions indicate otherwise, you may remove your bandages 24-48 hours after surgery, and you may shower at that time.  You may have steri-strips (small skin tapes) in place directly over the incision.  These strips should be left on the skin for 7-10 days.  If your surgeon used skin glue on the  incision, you may shower in 24 hours.  The glue will flake off over the next 2-3 weeks.  Any sutures or staples will be removed at the office during your follow-up visit. 8. ACTIVITIES:  You may resume regular (light) daily activities beginning the next day--such as daily self-care, walking, climbing stairs--gradually increasing activities as tolerated.  You may have sexual intercourse when it is comfortable.  Refrain from any heavy lifting or straining until approved by your doctor.  a.You may drive when you are no longer taking prescription pain medication, you can comfortably wear a seatbelt, and you can safely maneuver your car and apply brakes. b.RETURN TO WORK:   _____________________________________________  9.You should see your doctor in the office for a follow-up appointment approximately 2-3 weeks after your surgery.  Make sure that you call for this appointment within a day or two after you arrive home to insure a convenient appointment time. 10.OTHER INSTRUCTIONS: _________________________    _____________________________________  WHEN TO CALL YOUR DOCTOR: Fever over 101.0 Inability to urinate Nausea and/or vomiting Extreme swelling or bruising Continued bleeding from incision. Increased pain, redness, or drainage from the incision  The clinic staff is available to answer your questions during regular business hours.  Please don't hesitate to call and ask to speak to one of the nurses for clinical concerns.  If you have a medical emergency, go to the nearest emergency room or call 911.  A surgeon from [facility] is always on call at the hospital   [address], [address],   [place], [place]  [address] ?  [address] [address], [place], [place]   [address] [phone] ? [phone] ? FAX [phone] Web site: [email or web address]   Next dose of Tylenol after 7pm as needed for pain.   Post Anesthesia Home Care Instructions  Activity: Get plenty of  rest for the remainder of the day. A responsible individual must stay with you for 24 hours following the procedure.  For the next 24 hours, DO NOT: -Drive a car -Advertising copywriter -Drink alcoholic beverages -Take any medication unless instructed by your physician -Make any legal decisions or sign important papers.  Meals: Start with liquid foods such as gelatin or soup. Progress to regular foods as tolerated. Avoid greasy, spicy, heavy foods. If nausea and/or vomiting occur, drink only clear liquids until the nausea and/or vomiting subsides. Call your physician if vomiting continues.  Special Instructions/Symptoms: Your throat may feel dry or sore from the anesthesia or the breathing tube placed in your throat during surgery. If this causes discomfort, gargle with warm salt water. The discomfort should disappear within 24 hours.  If you had a scopolamine patch placed behind your ear for the management of post- operative nausea and/or vomiting:  1. The medication in the patch is effective for 72 hours, after which it should be removed.  Wrap patch in a tissue and discard in the trash. Wash hands thoroughly with soap and water. 2. You may remove the patch earlier than 72 hours if you experience unpleasant side effects which may include dry mouth, dizziness or visual disturbances. 3. Avoid touching the patch. Wash your hands with soap and water after contact with the patch.

## 2021-05-05 NOTE — Progress Notes (Signed)
Assisted Dr. Armond Hang with left, ultrasound guided, transabdominal plane block. Side rails up, monitors on throughout procedure. See vital signs in flow sheet. Tolerated Procedure well.

## 2021-05-05 NOTE — Op Note (Signed)
Left inguinal hernia repair with ultra Pro hernia system mesh, Open, Procedure Note  Indications: The patient presented with a history of a left, reducible inguinal  hernia.  He has been having pain.  He notices a bulge in his left groin.  He was evaluated and found to have a left inguinal hernia.  We discussed options of repair discussed both open and laparoscopic techniques.  The use of mesh discussed.  Complications of bleeding, infection, recurrence, cord injury, testicle injury, nerve injury, numbness, pain, chronic pain, and the need further treatments and/or procedures discussed.  Other complications of vascular injury, organ injury and cardiovascular applications discussed as well.  He agreed to proceed and wished to proceed with an open repair.  Pre-operative Diagnosis: left inguinal hernia reducible  Post-operative Diagnosis: same  Surgeon: Dortha Schwalbe MD  Assistants: Dr Mindi Slicker MD   I was personally present/performed   the key and critical portions of this procedure and immediately available throughout the entire procedure, as documented in my operative note.   Anesthesia: General endotracheal anesthesia, Local anesthesia 0.25.% bupivacaine, with epinephrine, and TAP block  ASA Class: 2  Procedure Details  The patient was seen again in the Holding Room. The risks, benefits, complications, treatment options, and expected outcomes were discussed with the patient. The possibilities of reaction to medication, pulmonary aspiration, perforation of viscus, bleeding, recurrent infection, the need for additional procedures, and development of a complication requiring transfusion or further operation were discussed with the patient and/or family. There was concurrence with the proposed plan, and informed consent was obtained. The site of surgery was properly noted/marked. The patient was taken to the Operating Room, identified as Derrick Thornton, and the procedure verified as hernia  repair. A Time Out was held and the above information confirmed.  The patient was placed in the supine position and underwent induction of anesthesia, the lower abdomen and groin was prepped and draped in the standard fashion, and 0.25% Marcaine with epinephrine was used to anesthetize the skin over the mid-portion of the inguinal canal. A transverse incision was made. Dissection was carried through the soft tissue to expose the inguinal canal and inguinal ligament along its lower edge. The external oblique fascia was split along the course of its fibers, exposing the inguinal canal. The cord and nerve were looped using a Penrose drain and reflected out of the field.  The nerve was identified.  The iliohypogastric nerve identified and preserved.  The ilioinguinal nerve was identified and was going to be tethered under the mesh placement.  I decided to divide the nerve as it exited the muscle to prevent mesh entrapment and chronic pain.  The indirect defect was exposed, dissected off the cord, reduced and a piece of prolene hernia system ultrapro mesh was and placed into the indirect defect and the inner leaflet was deployed into the preperitoneal space.  The onlay was placed onto the floor of the inguinal canal and a slit was cut in the mesh.. Interupted 1-0 novafil suture was then used  to repair the defect, with the suture being sewn from the pubic tubercle inferiorly and superiorly along the canal to a level just beyond the internal ring. The mesh was split to allow passage of the cord into the canal without entrapment.  Hemostasis achieved.  There is ample room for the tip of my fifth digit to slide along the cord between the cord and mesh noted.  There is no undue tension on the repair.  The contents  were then returned to the canal and the external oblique fashion was then closed in a continuous fashion using 2-0 Vicryl suture taking care not to cause entrapment. Scarpa's layer closed with 3 0 vicryl and 4 0  monocryl used to close the skin.  Dermabond used for dressing.  Instrument, sponge, and needle counts were correct prior to closure and at the conclusion of the case.  Findings: Hernia as above  Estimated Blood Loss: Minimal         Drains: None         Total IV Fluids: Per OR record         Specimens: None               Complications: None; patient tolerated the procedure well.         Disposition: PACU - hemodynamically stable.         Condition: stable

## 2021-05-05 NOTE — H&P (Signed)
History of Present Illness: Derrick Thornton. is a 75 y.o. male who is seen today as an office consultation at the request of Dr. Ludwig Clarks for evaluation of New Consultation (Ing. Hernia) .   Patient sent for evaluation of left inguinal hernia. He has noticed it for 5 months. Location is left groin and the swelling is more noted when he is upright. He has minimal pain. It is getting larger. He has no nausea vomiting or bowel changes.  Review of Systems: A complete review of systems was obtained from the patient. I have reviewed this information and discussed as appropriate with the patient. See HPI as well for other ROS.    Medical History: Past Medical History:  Diagnosis Date   Diabetes mellitus without complication (CMS-HCC)   GERD (gastroesophageal reflux disease)   Sleep apnea   There is no problem list on file for this patient.  Past Surgical History:  Procedure Laterality Date   ACHILLES TENDON REPAIR    No Known Allergies  Current Outpatient Medications on File Prior to Visit  Medication Sig Dispense Refill   glimepiride (AMARYL) 4 MG tablet glimepiride 4 mg tablet   metFORMIN (GLUCOPHAGE) 500 MG tablet metformin 500 mg tablet   omeprazole (PRILOSEC) 20 MG DR capsule Take 2 capsules (40 mg total) by mouth once daily   rosuvastatin (CRESTOR) 10 MG tablet Crestor 10 mg tablet   tamsulosin (FLOMAX) 0.4 mg capsule tamsulosin 0.4 mg capsule   No current facility-administered medications on file prior to visit.   Family History  Problem Relation Age of Onset   Hyperlipidemia (Elevated cholesterol) Mother   Stroke Father   High blood pressure (Hypertension) Father    Social History   Tobacco Use  Smoking Status Former   Types: Cigarettes   Quit date: 1980   Years since quitting: 42.8  Smokeless Tobacco Never    Social History   Socioeconomic History   Marital status: Married  Tobacco Use   Smoking status: Former  Types: Cigarettes  Quit date: 1980   Years since quitting: 42.8   Smokeless tobacco: Never  Vaping Use   Vaping Use: Never used  Substance and Sexual Activity   Alcohol use: Yes   Drug use: Never   Objective:   Vitals:  04/06/21 1533  BP: (!) 140/68  Pulse: 106  SpO2: 97%  Weight: 79.6 kg (175 lb 6.4 oz)  Height: 175.3 cm (5\' 9" )   Body mass index is 25.9 kg/m.  Physical Exam Constitutional:  Appearance: Normal appearance.  HENT:  Head: Normocephalic.  Eyes:  General: No scleral icterus. Pupils: Pupils are equal, round, and reactive to light.  Cardiovascular:  Rate and Rhythm: Normal rate.  Abdominal:  General: Abdomen is flat.  Palpations: Abdomen is soft.  Hernia: A hernia is present. Hernia is present in the left inguinal area.   Musculoskeletal:  General: Normal range of motion.  Cervical back: Normal range of motion and neck supple.  Skin: General: Skin is warm and dry.  Neurological:  General: No focal deficit present.  Mental Status: He is alert and oriented to person, place, and time.  Deep Tendon Reflexes: Abnormal reflex: 30  Psychiatric:  Mood and Affect: Mood normal.  Behavior: Behavior normal.     Assessment and Plan:  Diagnoses and all orders for this visit:  Left inguinal hernia    Recommend open repair of left inguinal hernia with mesh. Risk include bleeding, infection, chronic pain, injury to cord structures, injury to bowel, injury  to bladder, injury to major vascular structures, recurrence, chronic pain and the need further treatments and/or procedures. He agrees to proceed.  No follow-ups on file.  Hayden Rasmussen, MD

## 2021-05-05 NOTE — Interval H&P Note (Signed)
History and Physical Interval Note:  05/05/2021 1:21 PM  [name]  has presented today for surgery, with the diagnosis of LEFT INGUINAL HERNIA.  The various methods of treatment have been discussed with the patient and family. After consideration of risks, benefits and other options for treatment, the patient has consented to  Procedure(s): OPEN LEFT INGUINAL HERNIA REPAIR (Left) as a surgical intervention.  The patient's history has been reviewed, patient examined, no change in status, stable for surgery.  I have reviewed the patient's chart and labs.  Questions were answered to the patient's satisfaction.     Saw Mendenhall A Sorrel Cassetta

## 2021-05-06 NOTE — Anesthesia Postprocedure Evaluation (Signed)
Anesthesia Post Note  Patient: Derrick Thornton  Procedure(s) Performed: OPEN LEFT INGUINAL HERNIA REPAIR (Left: Groin)     Patient location during evaluation: PACU Anesthesia Type: Regional and General Level of consciousness: awake and alert Pain management: pain level controlled Vital Signs Assessment: post-procedure vital signs reviewed and stable Respiratory status: spontaneous breathing, nonlabored ventilation, respiratory function stable and patient connected to nasal cannula oxygen Cardiovascular status: blood pressure returned to baseline and stable Postop Assessment: no apparent nausea or vomiting Anesthetic complications: no   No notable events documented.  Last Vitals:  Vitals:   05/05/21 1602 05/05/21 1645  BP: 138/80 (!) 152/82  Pulse: 66 (!) 55  Resp: 16 16  Temp:  (!) 35.9 C  SpO2: 93% 97%    Last Pain:  Vitals:   05/06/21 0933  TempSrc:   PainSc: 3                  Aaren Krog L [name]

## 2021-05-07 ENCOUNTER — Encounter (HOSPITAL_BASED_OUTPATIENT_CLINIC_OR_DEPARTMENT_OTHER): Payer: Self-pay | Admitting: Surgery

## 2021-08-10 ENCOUNTER — Other Ambulatory Visit: Payer: Self-pay

## 2021-08-10 ENCOUNTER — Observation Stay (HOSPITAL_BASED_OUTPATIENT_CLINIC_OR_DEPARTMENT_OTHER)
Admission: EM | Admit: 2021-08-10 | Discharge: 2021-08-12 | Disposition: A | Discharge status: Home / Self Care | Payer: Medicare Other | Source: Home / Self Care | Attending: Student | Admitting: Student

## 2021-08-10 ENCOUNTER — Encounter (HOSPITAL_COMMUNITY): Payer: Self-pay | Admitting: Emergency Medicine

## 2021-08-10 ENCOUNTER — Emergency Department (HOSPITAL_COMMUNITY): Payer: Medicare Other

## 2021-08-10 DIAGNOSIS — N400 Enlarged prostate without lower urinary tract symptoms: Secondary | ICD-10-CM

## 2021-08-10 DIAGNOSIS — Z20822 Contact with and (suspected) exposure to covid-19: Secondary | ICD-10-CM | POA: Insufficient documentation

## 2021-08-10 DIAGNOSIS — I10 Essential (primary) hypertension: Secondary | ICD-10-CM | POA: Insufficient documentation

## 2021-08-10 DIAGNOSIS — N401 Enlarged prostate with lower urinary tract symptoms: Secondary | ICD-10-CM | POA: Insufficient documentation

## 2021-08-10 DIAGNOSIS — K625 Hemorrhage of anus and rectum: Secondary | ICD-10-CM | POA: Insufficient documentation

## 2021-08-10 DIAGNOSIS — K5733 Diverticulitis of large intestine without perforation or abscess with bleeding: Secondary | ICD-10-CM | POA: Diagnosis not present

## 2021-08-10 DIAGNOSIS — Z7984 Long term (current) use of oral hypoglycemic drugs: Secondary | ICD-10-CM | POA: Insufficient documentation

## 2021-08-10 DIAGNOSIS — Z79899 Other long term (current) drug therapy: Secondary | ICD-10-CM | POA: Insufficient documentation

## 2021-08-10 DIAGNOSIS — K5792 Diverticulitis of intestine, part unspecified, without perforation or abscess without bleeding: Secondary | ICD-10-CM

## 2021-08-10 DIAGNOSIS — E119 Type 2 diabetes mellitus without complications: Secondary | ICD-10-CM | POA: Insufficient documentation

## 2021-08-10 DIAGNOSIS — K922 Gastrointestinal hemorrhage, unspecified: Secondary | ICD-10-CM | POA: Diagnosis present

## 2021-08-10 LAB — COMPREHENSIVE METABOLIC PANEL
ALT: 17 U/L (ref 0–44)
AST: 26 U/L (ref 15–41)
Albumin: 3.6 g/dL (ref 3.5–5.0)
Alkaline Phosphatase: 64 U/L (ref 38–126)
Anion gap: 14 (ref 5–15)
BUN: 11 mg/dL (ref 8–23)
CO2: 22 mmol/L (ref 22–32)
Calcium: 8.9 mg/dL (ref 8.9–10.3)
Chloride: 102 mmol/L (ref 98–111)
Creatinine, Ser: 1.21 mg/dL (ref 0.61–1.24)
GFR, Estimated: >60 mL/min (ref >60)
Glucose, Bld: 188 mg/dL — ABNORMAL HIGH (ref 70–99)
Potassium: 3.6 mmol/L (ref 3.5–5.1)
Sodium: 138 mmol/L (ref 135–145)
Total Bilirubin: 0.7 mg/dL (ref 0.3–1.2)
Total Protein: 6.8 g/dL (ref 6.5–8.1)

## 2021-08-10 LAB — CBC WITH DIFFERENTIAL/PLATELET
Abs Immature Granulocytes: 0.02 10*3/uL (ref 0.00–0.07)
Basophils Absolute: 0 10*3/uL (ref 0.0–0.1)
Basophils Relative: 1 %
Eosinophils Absolute: 0.1 10*3/uL (ref 0.0–0.5)
Eosinophils Relative: 3 %
HCT: 36.7 % — ABNORMAL LOW (ref 39.0–52.0)
Hemoglobin: 11.8 g/dL — ABNORMAL LOW (ref 13.0–17.0)
Immature Granulocytes: 0 %
Lymphocytes Relative: 49 %
Lymphs Abs: 2.3 10*3/uL (ref 0.7–4.0)
MCH: 33.1 pg (ref 26.0–34.0)
MCHC: 32.2 g/dL (ref 30.0–36.0)
MCV: 102.8 fL — ABNORMAL HIGH (ref 80.0–100.0)
Monocytes Absolute: 0.5 10*3/uL (ref 0.1–1.0)
Monocytes Relative: 10 %
Neutro Abs: 1.7 10*3/uL (ref 1.7–7.7)
Neutrophils Relative %: 37 %
Platelets: 254 10*3/uL (ref 150–400)
RBC: 3.57 MIL/uL — ABNORMAL LOW (ref 4.22–5.81)
RDW: 11.8 % (ref 11.5–15.5)
WBC: 4.7 10*3/uL (ref 4.0–10.5)
nRBC: 0 % (ref 0.0–0.2)

## 2021-08-10 LAB — I-STAT CHEM 8, ED
BUN: 13 mg/dL (ref 8–23)
Calcium, Ion: 1.08 mmol/L — ABNORMAL LOW (ref 1.15–1.40)
Chloride: 103 mmol/L (ref 98–111)
Creatinine, Ser: 1.1 mg/dL (ref 0.61–1.24)
Glucose, Bld: 185 mg/dL — ABNORMAL HIGH (ref 70–99)
HCT: 36 % — ABNORMAL LOW (ref 39.0–52.0)
Hemoglobin: 12.2 g/dL — ABNORMAL LOW (ref 13.0–17.0)
Potassium: 3.5 mmol/L (ref 3.5–5.1)
Sodium: 139 mmol/L (ref 135–145)
TCO2: 26 mmol/L (ref 22–32)

## 2021-08-10 LAB — CBG MONITORING, ED: Glucose-Capillary: 145 mg/dL — ABNORMAL HIGH (ref 70–99)

## 2021-08-10 LAB — PROTIME-INR
INR: 1 (ref 0.8–1.2)
Prothrombin Time: 13.3 seconds (ref 11.4–15.2)

## 2021-08-10 MED ORDER — PANTOPRAZOLE SODIUM 40 MG IV SOLR
40.0000 mg | Freq: Once | INTRAVENOUS | Status: AC
  Administered 2021-08-10: 40 mg via INTRAVENOUS
  Filled 2021-08-10: qty 10

## 2021-08-10 NOTE — ED Provider Notes (Incomplete)
[facility] EMERGENCY DEPARTMENT Provider Note   CSN: [id number] Arrival date & time: 08/10/21  2025     History {Add pertinent medical, surgical, social history, OB history to HPI:1} Chief Complaint  Patient presents with   Rectal Bleeding    Derrick Thornton is a 76 y.o. male with history of diverticulosis presenting for lower GI bleed that started earlier today.  Patient reports bright red stools that are now turned darker.  He has had these happen in the past and to be admitted but never had surgical intervention.  Patient reports he feels sweaty, cold, weak, and tired.  Patient denies any cough, congestion, runny nose, or chest pain.   Rectal Bleeding     Home Medications Prior to Admission medications   Medication Sig Start Date End Date Taking? Authorizing Provider  Ascorbic Acid (VITAMIN C WITH ROSE HIPS) 500 MG tablet Take 500 mg by mouth daily.    [provider]  ibuprofen (ADVIL) 800 MG tablet Take 1 tablet (800 mg total) by mouth every 8 (eight) hours as needed. 05/05/21   Cornett, Maisie Fus, MD  metFORMIN (GLUCOPHAGE) 500 MG tablet Take 500 mg by mouth at bedtime. 12/22/19   [provider]  omeprazole (PRILOSEC) 40 MG capsule Take 1 capsule (40 mg total) by mouth daily. 01/24/20   Simmons-Robinson, Tawanna Cooler, MD  OVER THE COUNTER MEDICATION Take 1 tablet by mouth daily. [place] Mood Plus    [provider]  OVER THE COUNTER MEDICATION Take 1 capsule by mouth every other day. BEET POWDER     [provider]  oxyCODONE (OXY IR/ROXICODONE) 5 MG immediate release tablet Take 1 tablet (5 mg total) by mouth every 6 (six) hours as needed for severe pain. 05/05/21   Cornett, Maisie Fus, MD  rosuvastatin (CRESTOR) 5 MG tablet Take 2.5 mg by mouth daily.    [provider]  tadalafil (CIALIS) 5 MG tablet Take 5 mg by mouth daily as needed for erectile dysfunction.  08/20/15   [provider]  tamsulosin (FLOMAX) 0.4 MG  CAPS capsule Take 0.4 mg by mouth daily.    [provider]      Allergies    Patient has no known allergies.    Review of Systems   Review of Systems  Gastrointestinal:  Positive for hematochezia.   Physical Exam Updated Vital Signs BP 110/72    Pulse 78    Resp 12    SpO2 94%  Physical Exam Vitals and nursing note reviewed.  Constitutional:      General: He is in acute distress.     Appearance: He is ill-appearing.  HENT:     Head: Normocephalic and atraumatic.  Cardiovascular:     Rate and Rhythm: Regular rhythm. Tachycardia present.     Pulses: Normal pulses.  Pulmonary:     Effort: Pulmonary effort is normal. No respiratory distress.  Abdominal:     Tenderness: There is no abdominal tenderness. There is no guarding or rebound.  Neurological:     Mental Status: He is alert.    ED Results / Procedures / Treatments   Labs (all labs ordered are listed, but only abnormal results are displayed) Labs Reviewed  COMPREHENSIVE METABOLIC PANEL - Abnormal; Notable for the following components:      Result Value   Glucose, Bld 188 (*)    All other components within normal limits  CBC WITH DIFFERENTIAL/PLATELET - Abnormal; Notable for the following components:   RBC 3.57 (*)  Hemoglobin 11.8 (*)    HCT 36.7 (*)    MCV 102.8 (*)    All other components within normal limits  CBG MONITORING, ED - Abnormal; Notable for the following components:   Glucose-Capillary 145 (*)    All other components within normal limits  I-STAT CHEM 8, ED - Abnormal; Notable for the following components:   Glucose, Bld 185 (*)    Calcium, Ion 1.08 (*)    Hemoglobin 12.2 (*)    HCT 36.0 (*)    All other components within normal limits  PROTIME-INR  TYPE AND SCREEN    EKG None  Radiology No results found.  Procedures Procedures  On telemetry patient's heart rate improving.  Medications Ordered in ED Medications  pantoprazole (PROTONIX) injection 40 mg (40 mg Intravenous  Given 08/10/21 2138)    ED Course/ Medical Decision Making/ A&P Clinical Course as of 08/10/21 2254  Mon Aug 10, 2021  2136 CT angio  [MK]    Clinical Course User Index [MK] Glendora Score, MD                           Medical Decision Making Amount and/or Complexity of Data Reviewed Radiology: ordered.  Risk Prescription drug management.   ***  {Document critical care time when appropriate:1} {Document review of labs and clinical decision tools ie heart score, Chads2Vasc2 etc:1}  {Document your independent review of radiology images, and any outside records:1} {Document your discussion with family members, caretakers, and with consultants:1} {Document social determinants of health affecting pt's care:1} {Document your decision making why or why not admission, treatments were needed:1} Final Clinical Impression(s) / ED Diagnoses Final diagnoses:  None    Rx / DC Orders ED Discharge Orders     None

## 2021-08-10 NOTE — ED Triage Notes (Signed)
Patient reports rectal bleeding/bloody stools onset last night , denies injury or diarrhea , no fever or chills .  ?

## 2021-08-10 NOTE — ED Provider Triage Note (Addendum)
Emergency Medicine Provider Triage Evaluation Note ? ?Derrick Thornton , a 76 y.o. male  was evaluated in triage.  Pt complains of rectal bleeding that started yesterday. Hx diverticulosis. Has had 3 episodes today of dark red blood. Has some left lower abd pain. Reports feeling lightheaded and sweaty while walking into the ED. ? ?Review of Systems  ?Positive: Llq abd pain, rectal bleeding ?Negative: syncope ? ?Physical Exam  ?BP (!) 94/54 (BP Location: Left Arm)   Pulse (!) 125   Resp 16   SpO2 100%  ?Gen:   Awake, no distress   ?Resp:  Normal effort  ?MSK:   Moves extremities without difficulty  ?Other:  Diaphoretic, pale, mild llq abd ttp ? ?Medical Decision Making  ?Medically screening exam initiated at 8:48 PM.  Appropriate orders placed.  Derrick Thornton was informed that the remainder of the evaluation will be completed by another provider, this initial triage assessment does not replace that evaluation, and the importance of remaining in the ED until their evaluation is complete. ? ?8:47 PM Charge nurse notified that pt will need next available room ?  ?Karrie Meres, PA-C ?08/10/21 2047 ? ?  ?Karrie Meres, PA-C ?08/10/21 2048 ? ?

## 2021-08-10 NOTE — ED Provider Notes (Incomplete)
[facility] EMERGENC[facility] DEPARTMENT Provider Note   CSN: [id number] Arrival date & time: 08/10/21  2025     History {Add pertinent medical, surgical, social history, OB history to HPI:1} Chief Complaint  Patient presents with   Rectal Bleeding    Derrick Thornton is a 76 y.o. male.   Rectal Bleeding     Home Medications Prior to Admission medications   Medication Sig Start Date End Date Taking? Authorizing Provider  Ascorbic Acid (VITAMIN C WITH ROSE HIPS) 500 MG tablet Take 500 mg by mouth daily.    [provider]  ibuprofen (ADVIL) 800 MG tablet Take 1 tablet (800 mg total) by mouth every 8 (eight) hours as needed. 05/05/21   Cornett, Marcello Moores, MD  metFORMIN (GLUCOPHAGE) 500 MG tablet Take 500 mg by mouth at bedtime. 12/22/19   [provider]  omeprazole (PRILOSEC) 40 MG capsule Take 1 capsule (40 mg total) by mouth daily. 01/24/20   Simmons-Robinson, Riki Sheer, MD  OVER THE COUNTER MEDICATION Take 1 tablet by mouth daily. Zembright Mood Plus    [provider]  OVER THE COUNTER MEDICATION Take 1 capsule by mouth every other day. BEET POWDER     [provider]  oxyCODONE (OXY IR/ROXICODONE) 5 MG immediate release tablet Take 1 tablet (5 mg total) by mouth every 6 (six) hours as needed for severe pain. 05/05/21   Cornett, Marcello Moores, MD  rosuvastatin (CRESTOR) 5 MG tablet Take 2.5 mg by mouth daily.    [provider]  tadalafil (CIALIS) 5 MG tablet Take 5 mg by mouth daily as needed for erectile dysfunction.  08/20/15   [provider]  tamsulosin (FLOMAX) 0.4 MG CAPS capsule Take 0.4 mg by mouth daily.    [provider]      Allergies    Patient has no known allergies.    Review of Systems   Review of Systems  Gastrointestinal:  Positive for hematochezia.   Physical Exam Updated Vital Signs BP (!) 94/54 (BP Location: Left Arm)    Pulse (!) 125    Resp 16    SpO2 100%  Physical Exam  ED Results /  Procedures / Treatments   Labs (all labs ordered are listed, but only abnormal results are displayed) Labs Reviewed  COMPREHENSIVE METABOLIC PANEL - Abnormal; Notable for the following components:      Result Value   Glucose, Bld 188 (*)    All other components within normal limits  CBC WITH DIFFERENTIAL/PLATELET - Abnormal; Notable for the following components:   RBC 3.57 (*)    Hemoglobin 11.8 (*)    HCT 36.7 (*)    MCV 102.8 (*)    All other components within normal limits  CBG MONITORING, ED - Abnormal; Notable for the following components:   Glucose-Capillary 145 (*)    All other components within normal limits  I-STAT CHEM 8, ED - Abnormal; Notable for the following components:   Glucose, Bld 185 (*)    Calcium, Ion 1.08 (*)    Hemoglobin 12.2 (*)    HCT 36.0 (*)    All other components within normal limits  PROTIME-INR  TYPE AND SCREEN    EKG None  Radiology No results found.  Procedures Procedures  {Document cardiac monitor, telemetry assessment procedure when appropriate:1}  Medications Ordered in ED Medications  pantoprazole (PROTONIX) injection 40 mg (40 mg Intravenous Given 08/10/21 2138)    ED Course/ Medical Decision Making/ A&P Clinical Course as of 08/10/21 2210  Mon Aug 10, 2021  2136 CT angio  [MK]    Clinical Course User Index [MK] Teressa Lower, MD                           Medical Decision Making Amount and/or Complexity of Data Reviewed Radiology: ordered.  Risk Prescription drug management.   ***  {Document critical care time when appropriate:1} {Document review of labs and clinical decision tools ie heart score, Chads2Vasc2 etc:1}  {Document your independent review of radiology images, and any outside records:1} {Document your discussion with family members, caretakers, and with consultants:1} {Document social determinants of health affecting pt's care:1} {Document your decision making why or why not admission, treatments  were needed:1} Final Clinical Impression(s) / ED Diagnoses Final diagnoses:  None    Rx / DC Orders ED Discharge Orders     None

## 2021-08-11 DIAGNOSIS — K922 Gastrointestinal hemorrhage, unspecified: Secondary | ICD-10-CM | POA: Diagnosis not present

## 2021-08-11 DIAGNOSIS — N400 Enlarged prostate without lower urinary tract symptoms: Secondary | ICD-10-CM | POA: Diagnosis not present

## 2021-08-11 DIAGNOSIS — K5792 Diverticulitis of intestine, part unspecified, without perforation or abscess without bleeding: Secondary | ICD-10-CM

## 2021-08-11 DIAGNOSIS — E119 Type 2 diabetes mellitus without complications: Secondary | ICD-10-CM | POA: Diagnosis not present

## 2021-08-11 DIAGNOSIS — K5731 Diverticulosis of large intestine without perforation or abscess with bleeding: Secondary | ICD-10-CM | POA: Diagnosis not present

## 2021-08-11 LAB — CBC
HCT: 31 % — ABNORMAL LOW (ref 39.0–52.0)
HCT: 30.7 % — ABNORMAL LOW (ref 39.0–52.0)
Hemoglobin: 10.3 g/dL — ABNORMAL LOW (ref 13.0–17.0)
Hemoglobin: 9.9 g/dL — ABNORMAL LOW (ref 13.0–17.0)
MCH: 32.7 pg (ref 26.0–34.0)
MCH: 32.2 pg (ref 26.0–34.0)
MCHC: 33.2 g/dL (ref 30.0–36.0)
MCHC: 32.2 g/dL (ref 30.0–36.0)
MCV: 98.4 fL (ref 80.0–100.0)
MCV: 100 fL (ref 80.0–100.0)
Platelets: 252 10*3/uL (ref 150–400)
Platelets: 236 10*3/uL (ref 150–400)
RBC: 3.15 MIL/uL — ABNORMAL LOW (ref 4.22–5.81)
RBC: 3.07 MIL/uL — ABNORMAL LOW (ref 4.22–5.81)
RDW: 12 % (ref 11.5–15.5)
RDW: 11.9 % (ref 11.5–15.5)
WBC: 6 10*3/uL (ref 4.0–10.5)
WBC: 6.5 10*3/uL (ref 4.0–10.5)
nRBC: 0 % (ref 0.0–0.2)
nRBC: 0 % (ref 0.0–0.2)

## 2021-08-11 LAB — RESP PANEL BY RT-PCR (FLU A&B, COVID) ARPGX2
Influenza A by PCR: NEGATIVE
Influenza B by PCR: NEGATIVE
SARS Coronavirus 2 by RT PCR: NEGATIVE

## 2021-08-11 MED ORDER — METRONIDAZOLE 500 MG/100ML IV SOLN
500.0000 mg | Freq: Two times a day (BID) | INTRAVENOUS | Status: DC
  Administered 2021-08-11: 500 mg via INTRAVENOUS
  Filled 2021-08-11: qty 100

## 2021-08-11 MED ORDER — PANTOPRAZOLE SODIUM 40 MG PO TBEC
40.0000 mg | DELAYED_RELEASE_TABLET | Freq: Every day | ORAL | Status: DC
  Administered 2021-08-11 – 2021-08-12 (×2): 40 mg via ORAL
  Filled 2021-08-11 (×2): qty 1

## 2021-08-11 MED ORDER — METFORMIN HCL 500 MG PO TABS: 500.0000 mg | ORAL_TABLET | Freq: Every day | ORAL | Status: DC

## 2021-08-11 MED ORDER — IOHEXOL 350 MG/ML SOLN
100.0000 mL | Freq: Once | INTRAVENOUS | Status: AC | PRN
  Administered 2021-08-11: 100 mL via INTRAVENOUS

## 2021-08-11 MED ORDER — SODIUM CHLORIDE 0.9 % IV SOLN
2.0000 g | Freq: Every day | INTRAVENOUS | Status: DC
  Administered 2021-08-11: 2 g via INTRAVENOUS
  Filled 2021-08-11: qty 20

## 2021-08-11 MED ORDER — TAMSULOSIN HCL 0.4 MG PO CAPS
0.4000 mg | ORAL_CAPSULE | Freq: Every day | ORAL | Status: DC
  Administered 2021-08-11 – 2021-08-12 (×2): 0.4 mg via ORAL
  Filled 2021-08-11 (×2): qty 1

## 2021-08-11 NOTE — Assessment & Plan Note (Signed)
-   Start IV Rocephin and Flagyl ?

## 2021-08-11 NOTE — Progress Notes (Signed)
?  Transition of Care (TOC) Screening Note ? ? ?Patient Details  ?Name: Derrick Thornton ?Date of Birth: [birth year] ? ? ?Transition of Care (TOC) CM/SW Contact:    ?Benard Halsted, LCSW ?Phone Number: ?08/11/2021, 8:40 AM ? ? ? ?Transition of Care Department [facility]) has reviewed patient and no TOC needs have been identified at this time. We will continue to monitor patient advancement through interdisciplinary progression rounds. If new patient transition needs arise, please place a TOC consult. ? ? ?

## 2021-08-11 NOTE — H&P (Signed)
?History and Physical  ? ? ?Patient: Derrick Thornton [id number] DOB: 09/11/1945 ?DOA: 08/10/2021 ?DOS: the patient was seen and examined on 08/11/2021 ?PCP: [name], MD  ?Patient coming from: Home ? ?Chief Complaint:  ?Chief Complaint  ?Patient presents with  ? Rectal Bleeding  ? ?HPI: Derrick Thornton is a 76 y.o. male with medical history significant of GI bleed, Barrett's esophagus, GERD, type 2 diabetes, hyperlipidemia, and BPH, recent left inguinal hernia repair who presents with rectal bleeding/hematochezia. ? ?Patient reports seeing bright red blood with bowel movement yesterday that has progressively become darker in color.  No clots.  Has had a total of 4 episodes so far including 1 in the ED.  No abdominal pain.  No fevers or chills.  No nausea or vomiting.  He recently had a cold last week and took several days of ibuprofen.  Otherwise he is not on any antiplatelet or anticoagulation. ? ?Last hospitalization for hematochezia in November 2021 his hemoglobin remained stable and symptoms resolved spontaneously.  GI was consulted and did not recommend any further EGD/colonoscopy. ? ?Also had rectal bleeding in August 2021. 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  Red blood found from splenic flexure to rectum with scant blood in the right colon.  Suspected diverticular bleed. ?endoscopy on 12/2019 with 3cm hital hernia, Barrett's disease and otherwise normal stomach, duodenum with no source of UGI bleed.  ? ?In the ED, he is afebrile, initially hypertensive with BP 180/118 with heart rate in the 90s.  Hemoglobin of 11.8 down from 13.33 months ago.  BMP is unremarkable. ?CT angio of the abdomen and pelvis shows no evidence of acute hemorrhage, contrast extravasation or arterial stenosis. ?However did show extensive colonic diverticulosis with colonic wall thickening with fat stranding suggestive of acute diverticulitis. ? ?[facility] was consulted  and will see in consultation in the morning.  Hospitalist then called for admission. ? ? ?Review of Systems: As mentioned in the history of present illness. All other systems reviewed and are negative. ?Past Medical History:  ?Diagnosis Date  ? Anemia   ? Aortic atherosclerosis ([place])   ? Barrett's esophagus   ? DDD (degenerative disc disease), lumbar   ? Diabetes mellitus without complication ([place])   ? Diverticulitis   ? Diverticulosis   ? GI bleed   ? Hiatal hernia   ? Hyperplastic colon polyp   ? Hypertension   ? Internal hemorrhoids   ? Nephrolithiasis   ? ?Past Surgical History:  ?Procedure Laterality Date  ? ACHILLES TENDON REPAIR    ? BIOPSY  01/22/2020  ? Procedure: BIOPSY;  Surgeon: Doran Stabler, MD;  Location: [facility];  Service: Endoscopy;;  ? COLONOSCOPY WITH PROPOFOL N/A 01/22/2020  ? Procedure: COLONOSCOPY WITH PROPOFOL;  Surgeon: Doran Stabler, MD;  Location: [facility];  Service: Endoscopy;  Laterality: N/A;  ? ESOPHAGOGASTRODUODENOSCOPY (EGD) WITH PROPOFOL N/A 01/22/2020  ? Procedure: ESOPHAGOGASTRODUODENOSCOPY (EGD) WITH PROPOFOL;  Surgeon: Doran Stabler, MD;  Location: [facility];  Service: Endoscopy;  Laterality: N/A;  ? INGUINAL HERNIA REPAIR Left 05/05/2021  ? Procedure: OPEN LEFT INGUINAL HERNIA REPAIR;  Surgeon: Erroll Luna, MD;  Location: [facility];  Service: General;  Laterality: Left;  ? KNEE ARTHROSCOPY    ? ?Social History:  reports that he has never smoked. He has never used smokeless tobacco. He reports current alcohol use. He reports that he does not use drugs. ? ?No  Known Allergies ? ?Family History  ?Problem Relation Age of Onset  ? Stroke Father   ? Colon cancer Neg Hx   ? Stomach cancer Neg Hx   ? Pancreatic cancer Neg Hx   ? Esophageal cancer Neg Hx   ? ? ?Prior to Admission medications   ?Medication Sig Start Date End Date Taking? Authorizing Provider  ?Ascorbic Acid (VITAMIN C WITH ROSE HIPS) 500 MG tablet Take 500 mg by mouth daily.     [provider]  ?ibuprofen (ADVIL) 800 MG tablet Take 1 tablet (800 mg total) by mouth every 8 (eight) hours as needed. 05/05/21   Erroll Luna, MD  ?metFORMIN (GLUCOPHAGE) 500 MG tablet Take 500 mg by mouth at bedtime. 12/22/19   [provider]  ?omeprazole (PRILOSEC) 40 MG capsule Take 1 capsule (40 mg total) by mouth daily. 01/24/20   Simmons-Robinson, Riki Sheer, MD  ?OVER THE COUNTER MEDICATION Take 1 tablet by mouth daily. Zembright Mood Plus    [provider]  ?OVER THE COUNTER MEDICATION Take 1 capsule by mouth every other day. BEET POWDER     [provider]  ?oxyCODONE (OXY IR/ROXICODONE) 5 MG immediate release tablet Take 1 tablet (5 mg total) by mouth every 6 (six) hours as needed for severe pain. 05/05/21   Cornett, Marcello Moores, MD  ?rosuvastatin (CRESTOR) 5 MG tablet Take 2.5 mg by mouth daily.    [provider]  ?tadalafil (CIALIS) 5 MG tablet Take 5 mg by mouth daily as needed for erectile dysfunction.  08/20/15   [provider]  ?tamsulosin (FLOMAX) 0.4 MG CAPS capsule Take 0.4 mg by mouth daily.    [provider]  ? ? ?Physical Exam: ?Vitals:  ? 08/10/21 2030 08/10/21 2145 08/10/21 2315 08/10/21 2330  ?BP: (!) 94/54 110/72 113/70 100/72  ?Pulse: (!) 125 78 80 78  ?Resp: 16 12 12 13   ?SpO2: 100% 94% 98% 94%  ? ?Constitutional: NAD, calm, comfortable, well-appearing elderly male laying flat in bed ?Eyes: PERRL, lids and conjunctivae normal ?ENMT: Mucous membranes are moist.  ?Neck: normal, supple ?Respiratory: clear to auscultation bilaterally, no wheezing, no crackles. Normal respiratory effort. No accessory muscle use.  ?Cardiovascular: Regular rate and rhythm, no murmurs / rubs / gallops. No extremity edema. ?Abdomen: Soft, nondistended, nontender abdomen.  No guarding, rebound tenderness or rigidity.  No mass palpated.  Bowel sounds positive.  ?Musculoskeletal: no clubbing / cyanosis. No joint deformity upper and lower extremities. Good  ROM, no contractures. Normal muscle tone.  ?Skin: no rashes, lesions, ulcers. No induration ?Neurologic: CN 2-12 grossly intact. Sensation intact, DTR normal. Strength 5/5 in all 4.  ?Psychiatric: Normal judgment and insight. Alert and oriented x 3. Normal mood. ?Data Reviewed: ? ?See HPI ? ?Assessment and Plan: ?* Acute GI bleeding ?- Suspect likely diverticular bleed with history of the same and many largemouth diverticuli seen on CT abdomen angio.  No evidence of hemorrhage on CT.  However also has acute diverticulitis. ?-Hemoglobin downward trended to 11.8 from 13.3 several months ago.  Transfusion threshold of<7. ?-[facility] consulted and will see in the morning ? ?Acute diverticulitis ?- Start IV Rocephin and Flagyl ? ?BPH (benign prostatic hyperplasia) ?- Resume Flomax once able to take p.o. ? ?Type 2 diabetes mellitus ([place]) ?Placed on low-dose sensitive sliding scale ? ? ? ? ? Advance Care Planning:   Code Status: Full Code  ? ?Consults: [facility] ? ?Family Communication: No family at bedside ? ?Severity of Illness: ?The appropriate patient status  for this patient is OBSERVATION. Observation status is judged to be reasonable and necessary in order to provide the required intensity of service to ensure the patient's safety. The patient's presenting symptoms, physical exam findings, and initial radiographic and laboratory data in the context of their medical condition is felt to place them at decreased risk for further clinical deterioration. Furthermore, it is anticipated that the patient will be medically stable for discharge from the hospital within 2 midnights of admission.  ? ?Author: Orene Desanctis, DO ?08/11/2021 1:19 AM ? ?For on call review [email or web address].  ?

## 2021-08-11 NOTE — Assessment & Plan Note (Signed)
Placed on low-dose sensitive sliding scale ?

## 2021-08-11 NOTE — Assessment & Plan Note (Signed)
-   Resume Flomax once able to take p.o. ?

## 2021-08-11 NOTE — Care Management Obs Status (Signed)
MEDICARE OBSERVATION STATUS NOTIFICATION ? ? ?Patient Details  ?Name: Derrick Thornton ?MRN: [id number] ?Date of Birth: 07-30-1945 ? ? ?Medicare Observation Status Notification Given:  Yes ? ? ? ?Cyndi Bender, RN ?08/11/2021, 3:00 PM ?

## 2021-08-11 NOTE — Progress Notes (Signed)
Pt had large BM.  Stool mostly brown with some bright red blood.  MD notified.  Will continue to monitor.   ?

## 2021-08-11 NOTE — Assessment & Plan Note (Signed)
-   Suspect likely diverticular bleed with history of the same and many largemouth diverticuli seen on CT abdomen angio.  No evidence of hemorrhage on CT.  However also has acute diverticulitis. ?-Hemoglobin downward trended to 11.8 from 13.3 several months ago.  Transfusion threshold of<7. ?-[facility] consulted and will see in the morning ?

## 2021-08-11 NOTE — Progress Notes (Signed)
Pt arrives to unit via stretcher on room air with one staff RN.  ?

## 2021-08-11 NOTE — Progress Notes (Signed)
Pt aware he has an order for SCDS but is refusing them states he is not worried about blood clots and he feels he is healthier than that and doesn't need them and does definitely refuse them. Charge nurse notified pt decision to refuse SCDs.  ?

## 2021-08-11 NOTE — ED Notes (Signed)
Report given to Enrique Sack, RN [phone]). ?

## 2021-08-11 NOTE — Progress Notes (Addendum)
Interim progress note not for billing ? ?59M hx diverticular bleed x2 in 2021 here with 2 days hematochezia. Hemodynamically stable, hgb 10.3 this morning from 12.2 on presentation and 13.3 3 months ago. Not on blood thinners or anti-platelet agents, did take some ibuprofen 3 days ago. Has some mild LLQ pain and CT findings suggestive of diverticulitis. Last bleed was dark red last night at 7 PM. No vomiting. GI to see today, will maintain NPO. Will monitor hgb bid, maintain PIV. No fever, white count, or abdominal pain to speak of, so will hold on abx for diverticulitis. ?

## 2021-08-11 NOTE — Consult Note (Addendum)
? ?                                                                          [facility] Consult: ?9:42 AM ?08/11/2021 ? LOS: 0 days  ? ? ?Referring Provider: Dr Si Raider.    ?Primary Care Physician:  [name], MD ?Primary Gastroenterologist:  Dr. Hilarie Fredrickson.   ? ? Attending physician's note  ?I have taken a history, reviewed the chart and examined the patient. I performed a substantive portion of this encounter, including complete performance of at least one of the key components, in conjunction with the APP. I agree with the APP's note, impression and recommendations.  ?  ?76 year old very pleasant gentleman admitted with painless hematochezia likely secondary to diverticular hemorrhage ? ?He had similar episode in 2021, underwent colonoscopy in August 2021 that revealed extensive diverticulosis, unable to visualize the exact source ? ?He is currently hemodynamically stable ?Hemoglobin 10.3 ? ?He underwent CT angio with no evidence of active bleeding last night ? ?He has not had any further episodes of hematochezia since he was admitted last night ? ?Continue to monitor ?Clear liquid diet for today, will advance to soft diet tomorrow if no further bleeding ?Potential discharge tomorrow if hemoglobin remains stable and does not have any sign of bleeding ? ? The patient was provided an opportunity to ask questions and all were answered. The patient agreed with the plan and demonstrated an understanding of the instructions. ? ?K. Denzil Magnuson , MD ?[phone]    ? ?Reason for Consultation:  painless hematochezia.   ?  ?HPI: Derrick Thornton is a 76 y.o. male.  PMH Barrett's esophagus.  Hypertension.  DM 2.  ? ?Recurrent lower GI bleeds w ABL anemia w admissions attributed to diverticulosis.04/2018 (received 5 PRBCs) , 06/2019 (no transfusions), 12/2019 (2 PRBCs).  Previous colonoscopies had been performed by Dr.  Ferdinand Lango in [facility].  No hx of colon polyps. ?Admission 12/[facility] to [facility].  Got 2 PRBCs.   ?01/20/2020 CTAP and 01/22/2020 CTAP with angiography showed extensive diverticulosis but no active bleeding. ?01/22/2020 EGD: 3 cm HH.  Mucosal changes associated with short segment Barrett's disease, biopsied   Stomach, duodenum normal.  No source for upper GI bleeding.  Path: Acute and chronic inflammation, intestinal metaplasia, "with proper endoscopic findings this is diagnostic of Barrett's esophagus". ?01/22/2020 colonoscopy w fair prep revealed left and right colon diverticulosis.  Blood from splenic flexure to rectum but exact source of bleeding not located.  Dr. Loletha Carrow suspected diverticular bleed most likely from the left colon. ? ?Latest episode and admission was in late 03/2020, smaller volume hematochezia.  Hgb 12.1 (7.8 in 12/2019).  CTAP was ordered but canceled when the bleeding resolved.  Dr. Lyndel Safe did not repeat endoscopic/colonoscopic studies.  Pt discharged with in 24 hours ? ?On 05/05/2021 underwent outpt, open, left inguinal hernia repair with mesh placement by Dr. Brantley Stage. ? ?Presented to ED triage yesterday 1040 in the evening w recurrent painless hematochezia.  It had started the evening before.,  4 episodes in total with last episode around 7 PM last night.  Drove himself to the hospital and felt sweaty, weak, a bit dizzy when he walked from the car to the emergency  room but otherwise was not having these symptoms.  No chest pain, palpitations.  No nausea, vomiting. ? ?Afebrile.  Initial blood pressure 94/54 now in the 120s/70s.  Heart rate 70s to 80s.  Room air sats lower 90s to 100% ?Hgb 11.8 ..  10.3.  MCV 98.  Normal WBCs and platelets. ?Normal kidney function, LFTs, electrolytes.  Glucose as high as 188.   ?08/10/2021 CTAP w angio: No active bleeding.  Mild aortic atherosclerosis.  Extensive colonic diverticulosis.  Colonic wall thickening and surrounding fat stranding in the region of multiple giant  diverticula at the sigmoid suggest acute diverticulitis but no free air or abscess visualized.  Fat-containing umbilical hernia. ? ?Family history negative for colon, intestinal, pancreas, esophageal cancer. ?Lives w wife in [place]. ? ?Past Medical History:  ?Diagnosis Date  ? Anemia   ? Aortic atherosclerosis ([place])   ? Barrett's esophagus   ? DDD (degenerative disc disease), lumbar   ? Diabetes mellitus without complication ([place])   ? Diverticulitis   ? Diverticulosis   ? GI bleed   ? Hiatal hernia   ? Hyperplastic colon polyp   ? Hypertension   ? Internal hemorrhoids   ? Nephrolithiasis   ? ? ?Past Surgical History:  ?Procedure Laterality Date  ? ACHILLES TENDON REPAIR    ? BIOPSY  01/22/2020  ? Procedure: BIOPSY;  Surgeon: Doran Stabler, MD;  Location: [facility];  Service: Endoscopy;;  ? COLONOSCOPY WITH PROPOFOL N/A 01/22/2020  ? Procedure: COLONOSCOPY WITH PROPOFOL;  Surgeon: Doran Stabler, MD;  Location: [facility];  Service: Endoscopy;  Laterality: N/A;  ? ESOPHAGOGASTRODUODENOSCOPY (EGD) WITH PROPOFOL N/A 01/22/2020  ? Procedure: ESOPHAGOGASTRODUODENOSCOPY (EGD) WITH PROPOFOL;  Surgeon: Doran Stabler, MD;  Location: [employer];  Service: Endoscopy;  Laterality: N/A;  ? INGUINAL HERNIA REPAIR Left 05/05/2021  ? Procedure: OPEN LEFT INGUINAL HERNIA REPAIR;  Surgeon: Erroll Luna, MD;  Location: [facility];  Service: General;  Laterality: Left;  ? KNEE ARTHROSCOPY    ? ? ?Prior to Admission medications   ?Medication Sig Start Date End Date Taking? Authorizing Provider  ?Ascorbic Acid (VITAMIN C WITH ROSE HIPS) 500 MG tablet Take 500 mg by mouth daily.    [provider]  ?ibuprofen (ADVIL) 800 MG tablet Take 1 tablet (800 mg total) by mouth every 8 (eight) hours as needed. 05/05/21   Erroll Luna, MD  ?metFORMIN (GLUCOPHAGE) 500 MG tablet Take 500 mg by mouth at bedtime. 12/22/19   [provider]  ?omeprazole (PRILOSEC) 40 MG capsule Take 1 capsule (40  mg total) by mouth daily. 01/24/20   Simmons-Robinson, Riki Sheer, MD  ?OVER THE COUNTER MEDICATION Take 1 tablet by mouth daily. Zembright Mood Plus    [provider]  ?OVER THE COUNTER MEDICATION Take 1 capsule by mouth every other day. BEET POWDER     [provider]  ?oxyCODONE (OXY IR/ROXICODONE) 5 MG immediate release tablet Take 1 tablet (5 mg total) by mouth every 6 (six) hours as needed for severe pain. 05/05/21   Cornett, Marcello Moores, MD  ?rosuvastatin (CRESTOR) 5 MG tablet Take 2.5 mg by mouth daily.    [provider]  ?tadalafil (CIALIS) 5 MG tablet Take 5 mg by mouth daily as needed for erectile dysfunction.  08/20/15   [provider]  ?tamsulosin (FLOMAX) 0.4 MG CAPS capsule Take 0.4 mg by mouth daily.    [provider]  ? ? ?Scheduled Meds: ? [START ON 08/13/2021] metFORMIN  500 mg Oral QHS  ? tamsulosin  0.4 mg Oral Daily  ? ?Infusions: ? cefTRIAXone (ROCEPHIN)  IV Stopped (08/11/21 0248)  ? metronidazole 500 mg (08/11/21 0844)  ? ?PRN Meds: ? ? ? ?Allergies as of 08/10/2021  ? (No Known Allergies)  ? ? ?Family History  ?Problem Relation Age of Onset  ? Stroke Father   ? Colon cancer Neg Hx   ? Stomach cancer Neg Hx   ? Pancreatic cancer Neg Hx   ? Esophageal cancer Neg Hx   ? ? ?Social History  ? ?Socioeconomic History  ? Marital status: Married  ?  Spouse name: Not on file  ? Number of children: Not on file  ? Years of education: Not on file  ? Highest education level: Not on file  ?Occupational History  ? Not on file  ?Tobacco Use  ? Smoking status: Never  ? Smokeless tobacco: Never  ?Vaping Use  ? Vaping Use: Never used  ?Substance and Sexual Activity  ? Alcohol use: Yes  ?  Comment: occ  ? Drug use: Never  ? Sexual activity: Yes  ?Other Topics Concern  ? Not on file  ?Social History Narrative  ? Not on file  ? ?Social Determinants of Health  ? ?Financial Resource Strain: Not on file  ?Food Insecurity: Not on file  ?Transportation Needs: Not on file  ?Physical  Activity: Not on file  ?Stress: Not on file  ?Social Connections: Not on file  ?Intimate Partner Violence: Not on file  ? ? ?REVIEW OF SYSTEMS: ?Constitutional: See HPI.  Today he feels fine, not weak not d

## 2021-08-12 ENCOUNTER — Other Ambulatory Visit: Payer: Self-pay

## 2021-08-12 ENCOUNTER — Inpatient Hospital Stay (HOSPITAL_COMMUNITY)
Admission: EM | Admit: 2021-08-12 | Discharge: 2021-08-16 | DRG: 378 | Disposition: A | Discharge status: Home / Self Care | Payer: Medicare Other | Attending: Internal Medicine | Admitting: Internal Medicine

## 2021-08-12 DIAGNOSIS — K5733 Diverticulitis of large intestine without perforation or abscess with bleeding: Principal | ICD-10-CM | POA: Diagnosis present

## 2021-08-12 DIAGNOSIS — I10 Essential (primary) hypertension: Secondary | ICD-10-CM | POA: Diagnosis present

## 2021-08-12 DIAGNOSIS — K625 Hemorrhage of anus and rectum: Secondary | ICD-10-CM | POA: Diagnosis present

## 2021-08-12 DIAGNOSIS — I9589 Other hypotension: Secondary | ICD-10-CM | POA: Diagnosis present

## 2021-08-12 DIAGNOSIS — I700 Atherosclerosis of aorta: Secondary | ICD-10-CM | POA: Diagnosis present

## 2021-08-12 DIAGNOSIS — Z823 Family history of stroke: Secondary | ICD-10-CM | POA: Diagnosis not present

## 2021-08-12 DIAGNOSIS — E785 Hyperlipidemia, unspecified: Secondary | ICD-10-CM | POA: Diagnosis present

## 2021-08-12 DIAGNOSIS — Z7984 Long term (current) use of oral hypoglycemic drugs: Secondary | ICD-10-CM

## 2021-08-12 DIAGNOSIS — D62 Acute posthemorrhagic anemia: Secondary | ICD-10-CM | POA: Diagnosis present

## 2021-08-12 DIAGNOSIS — K5731 Diverticulosis of large intestine without perforation or abscess with bleeding: Secondary | ICD-10-CM | POA: Diagnosis not present

## 2021-08-12 DIAGNOSIS — K921 Melena: Secondary | ICD-10-CM | POA: Diagnosis not present

## 2021-08-12 DIAGNOSIS — K922 Gastrointestinal hemorrhage, unspecified: Secondary | ICD-10-CM | POA: Diagnosis not present

## 2021-08-12 DIAGNOSIS — Z20822 Contact with and (suspected) exposure to covid-19: Secondary | ICD-10-CM | POA: Diagnosis present

## 2021-08-12 DIAGNOSIS — E119 Type 2 diabetes mellitus without complications: Secondary | ICD-10-CM | POA: Diagnosis present

## 2021-08-12 DIAGNOSIS — Z79899 Other long term (current) drug therapy: Secondary | ICD-10-CM | POA: Diagnosis not present

## 2021-08-12 DIAGNOSIS — N400 Enlarged prostate without lower urinary tract symptoms: Secondary | ICD-10-CM | POA: Diagnosis present

## 2021-08-12 LAB — BASIC METABOLIC PANEL
Anion gap: 8 (ref 5–15)
BUN: 12 mg/dL (ref 8–23)
CO2: 27 mmol/L (ref 22–32)
Calcium: 8.6 mg/dL — ABNORMAL LOW (ref 8.9–10.3)
Chloride: 103 mmol/L (ref 98–111)
Creatinine, Ser: 0.96 mg/dL (ref 0.61–1.24)
GFR, Estimated: >60 mL/min (ref >60)
Glucose, Bld: 112 mg/dL — ABNORMAL HIGH (ref 70–99)
Potassium: 4 mmol/L (ref 3.5–5.1)
Sodium: 138 mmol/L (ref 135–145)

## 2021-08-12 LAB — CBC
HCT: 28.8 % — ABNORMAL LOW (ref 39.0–52.0)
HCT: 32.1 % — ABNORMAL LOW (ref 39.0–52.0)
Hemoglobin: 9.5 g/dL — ABNORMAL LOW (ref 13.0–17.0)
Hemoglobin: 9.9 g/dL — ABNORMAL LOW (ref 13.0–17.0)
MCH: 33.2 pg (ref 26.0–34.0)
MCH: 32 pg (ref 26.0–34.0)
MCHC: 33 g/dL (ref 30.0–36.0)
MCHC: 30.8 g/dL (ref 30.0–36.0)
MCV: 100.7 fL — ABNORMAL HIGH (ref 80.0–100.0)
MCV: 103.9 fL — ABNORMAL HIGH (ref 80.0–100.0)
Platelets: 227 10*3/uL (ref 150–400)
Platelets: 283 10*3/uL (ref 150–400)
RBC: 2.86 MIL/uL — ABNORMAL LOW (ref 4.22–5.81)
RBC: 3.09 MIL/uL — ABNORMAL LOW (ref 4.22–5.81)
RDW: 12 % (ref 11.5–15.5)
RDW: 12.1 % (ref 11.5–15.5)
WBC: 4.9 10*3/uL (ref 4.0–10.5)
WBC: 8.6 10*3/uL (ref 4.0–10.5)
nRBC: 0 % (ref 0.0–0.2)
nRBC: 0 % (ref 0.0–0.2)

## 2021-08-12 LAB — COMPREHENSIVE METABOLIC PANEL
ALT: 18 U/L (ref 0–44)
AST: 26 U/L (ref 15–41)
Albumin: 3.7 g/dL (ref 3.5–5.0)
Alkaline Phosphatase: 66 U/L (ref 38–126)
Anion gap: 11 (ref 5–15)
BUN: 13 mg/dL (ref 8–23)
CO2: 25 mmol/L (ref 22–32)
Calcium: 9.1 mg/dL (ref 8.9–10.3)
Chloride: 101 mmol/L (ref 98–111)
Creatinine, Ser: 1.01 mg/dL (ref 0.61–1.24)
GFR, Estimated: >60 mL/min (ref >60)
Glucose, Bld: 148 mg/dL — ABNORMAL HIGH (ref 70–99)
Potassium: 3.7 mmol/L (ref 3.5–5.1)
Sodium: 137 mmol/L (ref 135–145)
Total Bilirubin: 0.4 mg/dL (ref 0.3–1.2)
Total Protein: 6.9 g/dL (ref 6.5–8.1)

## 2021-08-12 LAB — TYPE AND SCREEN
ABO/RH(D): A POS
Antibody Screen: NEGATIVE

## 2021-08-12 MED ORDER — ACETAMINOPHEN 650 MG RE SUPP: 650.0000 mg | Freq: Four times a day (QID) | RECTAL | Status: DC | PRN

## 2021-08-12 MED ORDER — LACTATED RINGERS IV SOLN
INTRAVENOUS | Status: DC
Start: 2021-08-12 — End: 2021-08-16

## 2021-08-12 MED ORDER — ACETAMINOPHEN 325 MG PO TABS
650.0000 mg | ORAL_TABLET | Freq: Four times a day (QID) | ORAL | Status: DC | PRN
  Filled 2021-08-12: qty 2

## 2021-08-12 MED ORDER — INSULIN ASPART 100 UNIT/ML IJ SOLN
0.0000 [IU] | Freq: Three times a day (TID) | INTRAMUSCULAR | Status: DC
  Administered 2021-08-13 – 2021-08-14 (×3): 2 [IU] via SUBCUTANEOUS
  Administered 2021-08-15: 3 [IU] via SUBCUTANEOUS

## 2021-08-12 MED ORDER — PANTOPRAZOLE SODIUM 40 MG PO TBEC
80.0000 mg | DELAYED_RELEASE_TABLET | Freq: Every day | ORAL | Status: DC
  Administered 2021-08-13 – 2021-08-16 (×4): 80 mg via ORAL
  Filled 2021-08-12 (×4): qty 2

## 2021-08-12 MED ORDER — ONDANSETRON HCL 4 MG/2ML IJ SOLN: 4.0000 mg | Freq: Four times a day (QID) | INTRAMUSCULAR | Status: DC | PRN

## 2021-08-12 MED ORDER — ROSUVASTATIN CALCIUM 5 MG PO TABS
10.0000 mg | ORAL_TABLET | Freq: Every day | ORAL | Status: DC
  Administered 2021-08-12 – 2021-08-15 (×4): 10 mg via ORAL
  Filled 2021-08-12 (×4): qty 2

## 2021-08-12 MED ORDER — ONDANSETRON HCL 4 MG PO TABS: 4.0000 mg | ORAL_TABLET | Freq: Four times a day (QID) | ORAL | Status: DC | PRN

## 2021-08-12 MED ORDER — TAMSULOSIN HCL 0.4 MG PO CAPS
0.4000 mg | ORAL_CAPSULE | Freq: Every day | ORAL | Status: DC
  Administered 2021-08-13 – 2021-08-16 (×4): 0.4 mg via ORAL
  Filled 2021-08-12 (×4): qty 1

## 2021-08-12 NOTE — Progress Notes (Addendum)
? ?       Daily Rounding Note ? ?08/12/2021, 10:54 AM ? LOS: 0 days  ? ? Attending physician's note  ? ?I have taken a history, reviewed the chart and examined the patient. I performed a substantive portion of this encounter, including complete performance of at least one of the key components, in conjunction with the APP. I agree with the APP's note, impression and recommendations.  ?  ?Hemoglobin has remained stable ?No further episodes of hematochezia ?Advance diet ?Ok to discharge home from GI standpoint ? ?Repeat CBC in 2 weeks  ?Follow-up in GI office as needed ? ? The patient was provided an opportunity to ask questions and all were answered. The patient agreed with the plan and demonstrated an understanding of the instructions. ? ? ?K. Scherry Ran , MD ?[phone]    ?SUBJECTIVE:   ?Chief complaint: Painless hematochezia, presumed recurrent diverticular bleed.     ? ?Large bowel movement yesterday evening, mostly brown with some red blood.  BM since.  Feels well.  Looking forward to getting solid food. ? ?No hypotension.  No tachycardia ? ?OBJECTIVE:        ? Vital signs in last 24 hours:    ?Temp:  [98 ?F (36.7 ?C)-98.6 ?F (37 ?C)] 98.2 ?F (36.8 ?C) (03/15 0737) ?Pulse Rate:  [66-95] 73 (03/15 0737) ?Resp:  [10-20] 20 (03/15 0737) ?BP: (104-124)/(66-83) 124/81 (03/15 0737) ?SpO2:  [92 %-100 %] 100 % (03/15 0412) ?Last BM Date : 08/11/21 ?There were no vitals filed for this visit. ?General: NAD.  Alert, comfortable   ?Heart: RRR  ?Chest: No labored breathing ?Abdomen: Not distended. ?Neuro/Psych: Calm, cooperative, fluid speech.  No gross deficits or tremor. ? ?Intake/Output from previous day: ?03/14 0701 - 03/15 0700 ?In: 460 [P.O.:360; IV Piggyback:100] ?Out: -  ? ?Intake/Output this shift: ?Total I/O ?In: 360 [P.O.:360] ?Out: -  ? ?Lab Results: ?Recent Labs  ?  08/11/21 ?3419 08/11/21 ?1020 08/12/21 ?0424  ?WBC 6.0 6.5 4.9  ?HGB 10.3* 9.9* 9.5*   ?HCT 31.0* 30.7* 28.8*  ?PLT 252 236 227  ? ?BMET ?Recent Labs  ?  08/10/21 ?2047 08/10/21 ?2143 08/12/21 ?0424  ?NA 138 139 138  ?K 3.6 3.5 4.0  ?CL 102 103 103  ?CO2 22  --  27  ?GLUCOSE 188* 185* 112*  ?BUN 11 13 12   ?CREATININE 1.21 1.10 0.96  ?CALCIUM 8.9  --  8.6*  ? ?LFT ?Recent Labs  ?  08/10/21 ?2047  ?PROT 6.8  ?ALBUMIN 3.6  ?AST 26  ?ALT 17  ?ALKPHOS 64  ?BILITOT 0.7  ? ?PT/INR ?Recent Labs  ?  08/10/21 ?2047  ?LABPROT 13.3  ?INR 1.0  ? ?Hepatitis Panel ?No results for input(s): HEPBSAG, HCVAB, HEPAIGM, HEPBIGM in the last 72 hours. ? ?Studies/Results: ?CT Angio Abd/Pel W and/or Wo Contrast ? ?Result Date: 08/11/2021 ?CLINICAL DATA:  Lower GI bleed, rectal bleeding. EXAM: CTA ABDOMEN AND PELVIS WITHOUT AND WITH CONTRAST TECHNIQUE: Multidetector CT imaging of the abdomen and pelvis was performed using the standard protocol during bolus administration of intravenous contrast. Multiplanar reconstructed images and MIPs were obtained and reviewed to evaluate the vascular anatomy. RADIATION DOSE REDUCTION: This exam was performed according to the departmental dose-optimization program which includes automated exposure control, adjustment of the mA and/or kV according to patient size and/or use of iterative reconstruction technique. CONTRAST:  08/13/2021 OMNIPAQUE IOHEXOL 350 MG/ML SOLN COMPARISON:  01/22/2020. FINDINGS: VASCULAR Aorta: Mild atherosclerotic calcification. Normal caliber aorta without aneurysm, dissection, vasculitis  or significant stenosis. Celiac: Patent without evidence of aneurysm, dissection, vasculitis or significant stenosis. SMA: Patent without evidence of aneurysm, dissection, vasculitis or significant stenosis. Renals: Both renal arteries are patent without evidence of aneurysm, dissection, vasculitis, fibromuscular dysplasia or significant stenosis. IMA: Patent without evidence of aneurysm, dissection, vasculitis or significant stenosis. Inflow: Patent without evidence of aneurysm,  dissection, vasculitis or significant stenosis. Proximal Outflow: Bilateral common femoral and visualized portions of the superficial and profunda femoral arteries are patent without evidence of aneurysm, dissection, vasculitis or significant stenosis. Veins: No obvious venous abnormality within the limitations of this arterial phase study. Review of the MIP images confirms the above findings. NON-VASCULAR Lower chest: Mild atelectasis or scarring at the right lung base. Hepatobiliary: No focal liver abnormality is seen. No gallstones, gallbladder wall thickening, or biliary dilatation. Pancreas: Unremarkable. No pancreatic ductal dilatation or surrounding inflammatory changes. Spleen: Normal in size without focal abnormality. Adrenals/Urinary Tract: No adrenal nodule or mass. Multiple cysts are present in the left kidney. Subcentimeter hypodensities are noted bilaterally which are too small to further characterize. A nonobstructive calculus is present in the lower pole the left kidney. No hydronephrosis. The bladder is within normal limits. Stomach/Bowel: The stomach is within normal limits. No bowel obstruction, free air, or pneumatosis. A moderate amount of retained stool is present in the colon. Multiple scattered diverticula are noted along the colon, including giant diverticula at the sigmoid colon. There is mild bowel wall thickening at the sigmoid colon with mild surrounding fat stranding suggesting diverticulitis. No abscess or focal hemorrhage is identified. No contrast extravasation is seen. The appendix is normal in caliber. Lymphatic: No abdominal or pelvic lymphadenopathy. Reproductive: Prostate gland is mildly enlarged. Other: No free fluid.  A fat containing umbilical hernia is noted. Musculoskeletal: Degenerative changes in the thoracolumbar spine. No acute osseous abnormality. IMPRESSION: VASCULAR 1. No evidence of acute hemorrhage, contrast extravasation, or significant arterial stenosis. 2. Mild  aortic atherosclerosis. NON-VASCULAR 1. Extensive colonic diverticulosis. There is colonic wall thickening with surrounding fat stranding and multiple giant diverticula along the sigmoid colon, suggesting acute diverticulitis. No abscess or free air is seen. 2. Left renal cysts and nonobstructive calculus. 3. Enlarged prostate gland. Electronically Signed   By: Thornell Sartorius M.D.   On: 08/11/2021 00:29   ? ?Scheduled Meds: ? pantoprazole  40 mg Oral Q0600  ? tamsulosin  0.4 mg Oral Daily  ? ?Continuous Infusions: ?PRN Meds:. ? ? ?ASSESMENT:  ? ?Painless hematochezia, recurrent.  Likely recurrent diverticular bleed.  Resolved.   ? ?[place] anemia.  Hgb stable following initial decline. ? ? ?PLAN  ? ? OK for discharge home.  No need for office GI fup.  If/when recurrent bleed we are happy to see.   Would get CBC in next 2 to 4 weeks to assure it is improving.  Advanced to soft diet ? ? ? ?Jennye Moccasin  08/12/2021, 10:54 AM ?Phone [phone]  ?

## 2021-08-12 NOTE — Plan of Care (Signed)

## 2021-08-12 NOTE — Assessment & Plan Note (Signed)
Hold home PO hypoglycemics. ?Use sensitive SSI Q4H ?

## 2021-08-12 NOTE — ED Notes (Signed)
Prior to get blood work drawn, pt had used the bathroom and returned to room. Diaphoretic, feeling faint. Taken right back to room  ?

## 2021-08-12 NOTE — Discharge Summary (Addendum)
? ?PATIENT DETAILS ?Name: Derrick Thornton ?Age: 76 y.o. ?Sex: male ?Date of Birth: 07/01/1945 ?MRN: [id number]. ?Admitting Physician: Anselm Junglinghing T Tu, DO ?NWG:[id number]:Moreira, Channing Muttersoy, MD ? ?Admit Date: 08/10/2021 ?Discharge date: 08/12/2021 ? ?Recommendations for Outpatient Follow-up:  ?Follow up with PCP in 1-2 weeks ?Please obtain CMP/CBC in one week ? ? ?Admitted From:  ?Home ? ? ?Disposition: ?Home ?  ?Discharge Condition: ?good ? ?CODE STATUS: ?  Code Status: Full Code  ? ?Diet recommendation:  ?Diet Order   ? ?       ?  DIET SOFT Room service appropriate? Yes; Fluid consistency: Thin  Diet effective now       ?  ?  Diet - low sodium heart healthy       ?  ? ?  ?  ? ?  ?  ? ?Brief Summary: ?76 year old with a history of BPH, DM-2, prior diverticular bleeding-presented with painless hematochezia.  Patient was thought to have recurrent diverticular bleeding-and admitted to the hospitalist service. ? ?Brief Hospital Course: ?Lower GI bleeding: Likely diverticular in etiology.  Has significantly improved-had a mostly brown-colored BM yesterday with very little blood.  Some mild drop in hemoglobin but does not require transfusion.  Has not had a BM yet.  Reevaluated by gastroenterology this morning-cleared for discharge.  Please repeat CBC in the next 1-2 weeks. ? ?Note- CT showed possible diverticulitis-patient does not have any clinical features suggestive of diverticulitis-abdominal exam is completely benign on exam.  Not felt to require antibiotics at this point. ? ?Acute blood loss anemia: Likely due to above-mild drop in hemoglobin but patient is asymptomatic. ? ?DM-2: Resume metformin and Amaryl. ? ?BPH: Continue Flomax ? ?BMI: ?Estimated body mass index is 26.37 kg/m? as calculated from the following: ?  Height as of 05/05/21: 5\' 9"  (1.753 m). ?  Weight as of 05/05/21: 81 kg.  ? ? ?Discharge Diagnoses:  ?Principal Problem: ?  Acute GI bleeding ?Active Problems: ?  Acute diverticulitis ?  Type 2 diabetes mellitus (HCC) ?  BPH  (benign prostatic hyperplasia) ? ? ?Discharge Instructions: ? ?Activity:  ?As tolerated  ? ?Discharge Instructions   ? ? Call MD for:  difficulty breathing, headache or visual disturbances   Complete by: As directed ?  ? Diet - low sodium heart healthy   Complete by: As directed ?  ? Discharge instructions   Complete by: As directed ?  ? Follow with Primary MD  Ralene OkMoreira, Roy, MD in 1-2 weeks ? ?If GI bleeding recur/worsens-please seek immediate medical attention. ? ?Please get a complete blood count and chemistry panel checked by your Primary MD at your next visit, and again as instructed by your Primary MD. ? ?Get Medicines reviewed and adjusted: ?Please take all your medications with you for your next visit with your Primary MD ? ?Laboratory/radiological data: ?Please request your Primary MD to go over all hospital tests and procedure/radiological results at the follow up, please ask your Primary MD to get all Hospital records sent to his/her office. ? ?In some cases, they will be blood work, cultures and biopsy results pending at the time of your discharge. Please request that your primary care M.D. follows up on these results. ? ?Also Note the following: ?If you experience worsening of your admission symptoms, develop shortness of breath, life threatening emergency, suicidal or homicidal thoughts you must seek medical attention immediately by calling 911 or calling your MD immediately  if symptoms less severe. ? ?You must read complete instructions/literature along  with all the possible adverse reactions/side effects for all the Medicines you take and that have been prescribed to you. Take any new Medicines after you have completely understood and accpet all the possible adverse reactions/side effects.  ? ?Do not drive when taking Pain medications or sleeping medications (Benzodaizepines) ? ?Do not take more than prescribed Pain, Sleep and Anxiety Medications. It is not advisable to combine anxiety,sleep and pain  medications without talking with your primary care practitioner ? ?Special Instructions: If you have smoked or chewed Tobacco  in the last 2 yrs please stop smoking, stop any regular Alcohol  and or any Recreational drug use. ? ?Wear Seat belts while driving. ? ?Please note: ?You were cared for by a hospitalist during your hospital stay. Once you are discharged, your primary care physician will handle any further medical issues. Please note that NO REFILLS for any discharge medications will be authorized once you are discharged, as it is imperative that you return to your primary care physician (or establish a relationship with a primary care physician if you do not have one) for your post hospital discharge needs so that they can reassess your need for medications and monitor your lab values.  ? Increase activity slowly   Complete by: As directed ?  ? ?  ? ?Allergies as of 08/12/2021   ? ?   Reactions  ? Tuberculin, Ppd   ? ?  ? ?  ?Medication List  ?  ? ?TAKE these medications   ? ?folic acid 400 MCG tablet ?Commonly known as: FOLVITE ?Take 400 mcg by mouth 3 (three) times a week. ?  ?glimepiride 4 MG tablet ?Commonly known as: AMARYL ?Take 4 mg by mouth daily as needed. If CBG  is > 150 ?  ?ibuprofen 800 MG tablet ?Commonly known as: ADVIL ?Take 1 tablet (800 mg total) by mouth every 8 (eight) hours as needed. ?What changed: reasons to take this ?  ?metFORMIN 500 MG tablet ?Commonly known as: GLUCOPHAGE ?Take 500 mg by mouth at bedtime. ?  ?omeprazole 40 MG capsule ?Commonly known as: PRILOSEC ?Take 1 capsule (40 mg total) by mouth daily. ?  ?OVER THE COUNTER MEDICATION ?Take 1 tablet by mouth every other day. Ashwabright Mood Plus ?  ?OVER THE COUNTER MEDICATION ?Take 1 capsule by mouth every other day. BEET POWDER ?  ?rosuvastatin 10 MG tablet ?Commonly known as: CRESTOR ?Take 10 mg by mouth daily. ?  ?tadalafil 5 MG tablet ?Commonly known as: CIALIS ?Take 5 mg by mouth 2 (two) times a week. Monday & Friday ?   ?tamsulosin 0.4 MG Caps capsule ?Commonly known as: FLOMAX ?Take 0.4 mg by mouth daily. ?  ?vitamin C with rose hips 500 MG tablet ?Take 500 mg by mouth daily. ?  ? ?  ? ? Follow-up Information   ? ? Ralene Ok, MD. Schedule an appointment as soon as possible for a visit in 1 week(s).   ?Specialty: Internal Medicine ?Why: Hospital follow up ?Contact information: ?[address] ?[place] [address] ?[phone] ? ? ?  ?  ? ?  ?  ? ?  ? ?Allergies  ?Allergen Reactions  ? Tuberculin, Ppd   ? ? ? ?Other Procedures/Studies: ?CT Angio Abd/Pel W and/or Wo Contrast ? ?Result Date: 08/11/2021 ?CLINICAL DATA:  Lower GI bleed, rectal bleeding. EXAM: CTA ABDOMEN AND PELVIS WITHOUT AND WITH CONTRAST TECHNIQUE: Multidetector CT imaging of the abdomen and pelvis was performed using the standard protocol during bolus administration of intravenous contrast.  Multiplanar reconstructed images and MIPs were obtained and reviewed to evaluate the vascular anatomy. RADIATION DOSE REDUCTION: This exam was performed according to the departmental dose-optimization program which includes automated exposure control, adjustment of the mA and/or kV according to patient size and/or use of iterative reconstruction technique. CONTRAST:  OMNIPAQUE IOHEXOL 350 MG/ML SOLN COMPARISON:  01/22/2020. FINDINGS: VASCULAR Aorta: Mild atherosclerotic calcification. Normal caliber aorta without aneurysm, dissection, vasculitis or significant stenosis. Celiac: Patent without evidence of aneurysm, dissection, vasculitis or significant stenosis. SMA: Patent without evidence of aneurysm, dissection, vasculitis or significant stenosis. Renals: Both renal arteries are patent without evidence of aneurysm, dissection, vasculitis, fibromuscular dysplasia or significant stenosis. IMA: Patent without evidence of aneurysm, dissection, vasculitis or significant stenosis. Inflow: Patent without evidence of aneurysm, dissection, vasculitis or significant stenosis.  Proximal Outflow: Bilateral common femoral and visualized portions of the superficial and profunda femoral arteries are patent without evidence of aneurysm, dissection, vasculitis or significant stenosis

## 2021-08-12 NOTE — ED Triage Notes (Signed)
Pt arrives to ED POV c/o Rectal Bleeding and lower abd pain. Pt was discharged today from the hospital but began bleeding again. ?

## 2021-08-12 NOTE — ED Triage Notes (Signed)
Pt had BM with dark red blood and formed stool ?

## 2021-08-12 NOTE — ED Provider Notes (Signed)
?[facility] EMERGENCY DEPARTMENT ?Provider Note ? ? ?CSN: [id number] ?Arrival date & time: 08/12/21  1956 ? ?  ? ?History ? ?Chief Complaint  ?Patient presents with  ? Rectal Bleeding  ? ? ?Derrick Thornton is a 76 y.o. male. ? ?HPI ? ?76 year old male with past medical history of HTN, HLD, diverticulosis, DM presents to the emergency department with concern for GI bleed.  Of note patient was just discharged today after an admission for hematochezia.  He was seen by gastroenterology, source was presumed to be diverticular bleed, his symptoms resolved and he was discharged home.  Patient is not anticoagulated.  Patient states when he got home he had a large bowel movement of bright red blood which brought him back to the ER.  Here in triage she had another episode of bright red bleeding per rectum with hypotension and vasovagal syncope.  On my initial evaluation patient is laying flat, endorsing generalized fatigue.  Denies any abdominal pain.  No recent fever. ? ?Home Medications ?Prior to Admission medications   ?Medication Sig Start Date End Date Taking? Authorizing Provider  ?Ascorbic Acid (VITAMIN C WITH ROSE HIPS) 500 MG tablet Take 500 mg by mouth 3 (three) times a week. No set days   Yes [provider]  ?folic acid (FOLVITE) 400 MCG tablet Take 400 mcg by mouth 3 (three) times a week.   Yes [provider]  ?glimepiride (AMARYL) 4 MG tablet Take 4 mg by mouth daily as needed. If CBG  is > 150   Yes [provider]  ?ibuprofen (ADVIL) 800 MG tablet Take 1 tablet (800 mg total) by mouth every 8 (eight) hours as needed. ?Patient taking differently: Take 800 mg by mouth every 8 (eight) hours as needed for mild pain. 05/05/21  Yes Cornett, Maisie Fus, MD  ?metFORMIN (GLUCOPHAGE) 500 MG tablet Take 500 mg by mouth at bedtime. 12/22/19  Yes [provider]  ?omeprazole (PRILOSEC) 40 MG capsule Take 1 capsule (40 mg total) by mouth daily. 01/24/20  Yes Simmons-Robinson,  Makiera, MD  ?OVER THE COUNTER MEDICATION Take 1 tablet by mouth every other day. Ashwabright Mood Plus   Yes [provider]  ?OVER THE COUNTER MEDICATION Take 1 capsule by mouth every other day. BEET POWDER    Yes [provider]  ?rosuvastatin (CRESTOR) 10 MG tablet Take 10 mg by mouth at bedtime.   Yes [provider]  ?tadalafil (CIALIS) 5 MG tablet Take 5 mg by mouth 2 (two) times a week. Monday & Friday 08/20/15  Yes [provider]  ?tamsulosin (FLOMAX) 0.4 MG CAPS capsule Take 0.4 mg by mouth daily.   Yes [provider]  ?Zinc 50 MG TABS Take 50 mg by mouth 2 (two) times a week. No set days   Yes [provider]  ?   ? ?Allergies    ?Tuberculin, ppd   ? ?Review of Systems   ?Review of Systems  ?Constitutional:  Positive for fatigue. Negative for fever.  ?Respiratory:  Positive for shortness of breath.   ?Cardiovascular:  Negative for chest pain.  ?Gastrointestinal:  Positive for anal bleeding and blood in stool. Negative for abdominal distention, abdominal pain, diarrhea, rectal pain and vomiting.  ?Skin:  Negative for rash.  ?Neurological:  Negative for headaches.  ? ?Physical Exam ?Updated Vital Signs ?BP 103/74   Pulse 82   Temp 98.1 ?F (36.7 ?C) (Oral)   Resp 18   SpO2 97%  ?Physical Exam ?Vitals and  nursing note reviewed.  ?Constitutional:   ?   Appearance: Normal appearance. He is ill-appearing.  ?   Comments: pale  ?HENT:  ?   Head: Normocephalic.  ?   Mouth/Throat:  ?   Mouth: Mucous membranes are moist.  ?Cardiovascular:  ?   Rate and Rhythm: Tachycardia present.  ?Pulmonary:  ?   Effort: Pulmonary effort is normal. No respiratory distress.  ?Abdominal:  ?   General: There is no distension.  ?   Palpations: Abdomen is soft.  ?   Tenderness: There is no abdominal tenderness.  ?Skin: ?   General: Skin is warm.  ?   Coloration: Skin is pale.  ?Neurological:  ?   Mental Status: He is alert and oriented to person, place, and time. Mental status  is at baseline.  ?Psychiatric:     ?   Mood and Affect: Mood normal.  ? ? ?ED Results / Procedures / Treatments   ?Labs ?(all labs ordered are listed, but only abnormal results are displayed) ?Labs Reviewed  ?COMPREHENSIVE METABOLIC PANEL - Abnormal; Notable for the following components:  ?    Result Value  ? Glucose, Bld 148 (*)   ? All other components within normal limits  ?CBC - Abnormal; Notable for the following components:  ? RBC 3.09 (*)   ? Hemoglobin 9.9 (*)   ? HCT 32.1 (*)   ? MCV 103.9 (*)   ? All other components within normal limits  ?CBC  ?HEMOGLOBIN A1C  ?BASIC METABOLIC PANEL  ?POC OCCULT BLOOD, ED  ?TYPE AND SCREEN  ? ? ?EKG ?None ? ?Radiology ?CT Angio Abd/Pel W and/or Wo Contrast ? ?Result Date: 08/11/2021 ?CLINICAL DATA:  Lower GI bleed, rectal bleeding. EXAM: CTA ABDOMEN AND PELVIS WITHOUT AND WITH CONTRAST TECHNIQUE: Multidetector CT imaging of the abdomen and pelvis was performed using the standard protocol during bolus administration of intravenous contrast. Multiplanar reconstructed images and MIPs were obtained and reviewed to evaluate the vascular anatomy. RADIATION DOSE REDUCTION: This exam was performed according to the departmental dose-optimization program which includes automated exposure control, adjustment of the mA and/or kV according to patient size and/or use of iterative reconstruction technique. CONTRAST:  100mL OMNIPAQUE IOHEXOL 350 MG/ML SOLN COMPARISON:  01/22/2020. FINDINGS: VASCULAR Aorta: Mild atherosclerotic calcification. Normal caliber aorta without aneurysm, dissection, vasculitis or significant stenosis. Celiac: Patent without evidence of aneurysm, dissection, vasculitis or significant stenosis. SMA: Patent without evidence of aneurysm, dissection, vasculitis or significant stenosis. Renals: Both renal arteries are patent without evidence of aneurysm, dissection, vasculitis, fibromuscular dysplasia or significant stenosis. IMA: Patent without evidence of aneurysm,  dissection, vasculitis or significant stenosis. Inflow: Patent without evidence of aneurysm, dissection, vasculitis or significant stenosis. Proximal Outflow: Bilateral common femoral and visualized portions of the superficial and profunda femoral arteries are patent without evidence of aneurysm, dissection, vasculitis or significant stenosis. Veins: No obvious venous abnormality within the limitations of this arterial phase study. Review of the MIP images confirms the above findings. NON-VASCULAR Lower chest: Mild atelectasis or scarring at the right lung base. Hepatobiliary: No focal liver abnormality is seen. No gallstones, gallbladder wall thickening, or biliary dilatation. Pancreas: Unremarkable. No pancreatic ductal dilatation or surrounding inflammatory changes. Spleen: Normal in size without focal abnormality. Adrenals/Urinary Tract: No adrenal nodule or mass. Multiple cysts are present in the left kidney. Subcentimeter hypodensities are noted bilaterally which are too small to further characterize. A nonobstructive calculus is present in the lower pole the left kidney. No hydronephrosis. The bladder is within  normal limits. Stomach/Bowel: The stomach is within normal limits. No bowel obstruction, free air, or pneumatosis. A moderate amount of retained stool is present in the colon. Multiple scattered diverticula are noted along the colon, including giant diverticula at the sigmoid colon. There is mild bowel wall thickening at the sigmoid colon with mild surrounding fat stranding suggesting diverticulitis. No abscess or focal hemorrhage is identified. No contrast extravasation is seen. The appendix is normal in caliber. Lymphatic: No abdominal or pelvic lymphadenopathy. Reproductive: Prostate gland is mildly enlarged. Other: No free fluid.  A fat containing umbilical hernia is noted. Musculoskeletal: Degenerative changes in the thoracolumbar spine. No acute osseous abnormality. IMPRESSION: VASCULAR 1. No  evidence of acute hemorrhage, contrast extravasation, or significant arterial stenosis. 2. Mild aortic atherosclerosis. NON-VASCULAR 1. Extensive colonic diverticulosis. There is colonic wall thickening with s

## 2021-08-12 NOTE — Progress Notes (Signed)
Pt. Discharge to home after the AVS was reviewed and pt. Acknowledged understanding.  NSL d/c'd, catheter intact and site unremarkable.  Pt. Taken out in w/c to car. ?

## 2021-08-12 NOTE — H&P (Signed)
?History and Physical  ? ? ?Patient: Derrick Thornton ?DOA: 08/12/2021 ?DOS: the patient was seen and examined on 08/12/2021 ?PCP: Ralene OkMoreira, Roy, MD  ?Patient coming from: Home ? ?Chief Complaint:  ?Chief Complaint  ?Patient presents with  ? Rectal Bleeding  ? ?HPI: Derrick Thornton is a 76 y.o. male with medical history significant of DM2, HTN, prior GIB, Barrett's esophagus. ? ?Pt recently admitted 3/14-3/15 (discharged earlier today) after hematochezia suspected to be diverticular bleed. ? ?Pt returns to ED this evening with recurrent rectal bleeding, lower abd pain.  Lightheadedness after BM as well as tachycardia and hypotension in ED with the lightheadedness. ? ?Tachycardia and hypotension resolved currently. ? ?Not on any blood thinners. ? ?CTA AP on 3/14 = no bleeding source identified, ? Of diverticulitis though pt not felt to have diverticulitis clinically so not started on ABx. ? ? ?Review of Systems: As mentioned in the history of present illness. All other systems reviewed and are negative. ?Past Medical History:  ?Diagnosis Date  ? Anemia   ? Aortic atherosclerosis (HCC)   ? Barrett's esophagus   ? DDD (degenerative disc disease), lumbar   ? Diabetes mellitus without complication (HCC)   ? Diverticulitis   ? Diverticulosis   ? GI bleed   ? Hiatal hernia   ? Hyperplastic colon polyp   ? Hypertension   ? Internal hemorrhoids   ? Nephrolithiasis   ? ?Past Surgical History:  ?Procedure Laterality Date  ? ACHILLES TENDON REPAIR    ? BIOPSY  01/22/2020  ? Procedure: BIOPSY;  Surgeon: Sherrilyn Ristanis, Henry L III, MD;  [facility];  Service: Endoscopy;;  ? COLONOSCOPY WITH PROPOFOL N/A 01/22/2020  ? Procedure: COLONOSCOPY WITH PROPOFOL;  Surgeon: Sherrilyn Ristanis, Henry L III, MD;  [facility];  Service: Endoscopy;  Laterality: N/A;  ? ESOPHAGOGASTRODUODENOSCOPY (EGD) WITH PROPOFOL N/A 01/22/2020  ? Procedure: ESOPHAGOGASTRODUODENOSCOPY (EGD) WITH PROPOFOL;  Surgeon: Sherrilyn Ristanis, Henry L  III, MD;  [facility];  Service: Endoscopy;  Laterality: N/A;  ? INGUINAL HERNIA REPAIR Left 05/05/2021  ? Procedure: OPEN LEFT INGUINAL HERNIA REPAIR;  Surgeon: Harriette Bouillonornett, Thomas, MD;  Location: [facility];  Service: General;  Laterality: Left;  ? KNEE ARTHROSCOPY    ? ?Social History:  reports that he has never smoked. He has never used smokeless tobacco. He reports current alcohol use. He reports that he does not use drugs. ? ?Allergies  ?Allergen Reactions  ? Tuberculin, Ppd   ? ? ?Family History  ?Problem Relation Age of Onset  ? Stroke Father   ? Colon cancer Neg Hx   ? Stomach cancer Neg Hx   ? Pancreatic cancer Neg Hx   ? Esophageal cancer Neg Hx   ? ? ?Prior to Admission medications   ?Medication Sig Start Date End Date Taking? Authorizing Provider  ?Ascorbic Acid (VITAMIN C WITH ROSE HIPS) 500 MG tablet Take 500 mg by mouth 3 (three) times a week. No set days   Yes [provider]  ?folic acid (FOLVITE) 400 MCG tablet Take 400 mcg by mouth 3 (three) times a week.   Yes [provider]  ?glimepiride (AMARYL) 4 MG tablet Take 4 mg by mouth daily as needed. If CBG  is > 150   Yes [provider]  ?ibuprofen (ADVIL) 800 MG tablet Take 1 tablet (800 mg total) by mouth every 8 (eight) hours as needed. ?Patient taking differently: Take 800 mg by mouth every 8 (eight) hours as needed  for mild pain. 05/05/21  Yes Cornett, Maisie Fus, MD  ?metFORMIN (GLUCOPHAGE) 500 MG tablet Take 500 mg by mouth at bedtime. 12/22/19  Yes [provider]  ?omeprazole (PRILOSEC) 40 MG capsule Take 1 capsule (40 mg total) by mouth daily. 01/24/20  Yes Simmons-Robinson, Makiera, MD  ?OVER THE COUNTER MEDICATION Take 1 tablet by mouth every other day. Ashwabright Mood Plus   Yes [provider]  ?OVER THE COUNTER MEDICATION Take 1 capsule by mouth every other day. BEET POWDER    Yes [provider]  ?rosuvastatin (CRESTOR) 10 MG tablet Take 10 mg by mouth at bedtime.    Yes [provider]  ?tadalafil (CIALIS) 5 MG tablet Take 5 mg by mouth 2 (two) times a week. Monday & Friday 08/20/15  Yes [provider]  ?tamsulosin (FLOMAX) 0.4 MG CAPS capsule Take 0.4 mg by mouth daily.   Yes [provider]  ?Zinc 50 MG TABS Take 50 mg by mouth 2 (two) times a week. No set days   Yes [provider]  ? ? ?Physical Exam: ?Vitals:  ? 08/12/21 2055 08/12/21 2100 08/12/21 2115 08/12/21 2200  ?BP: 92/72  104/76 103/74  ?Pulse:  87 91 82  ?Resp: 20 16 17 18   ?Temp:      ?TempSrc:      ?SpO2:  100% 97% 97%  ? ?Constitutional: NAD, calm, comfortable ?Eyes: PERRL, lids and conjunctivae normal ?ENMT: Mucous membranes are moist. Posterior pharynx clear of any exudate or lesions.Normal dentition.  ?Neck: normal, supple, no masses, no thyromegaly ?Respiratory: clear to auscultation bilaterally, no wheezing, no crackles. Normal respiratory effort. No accessory muscle use.  ?Cardiovascular: Regular rate and rhythm, no murmurs / rubs / gallops. No extremity edema. 2+ pedal pulses. No carotid bruits.  ?Abdomen: no tenderness, no masses palpated. No hepatosplenomegaly. Bowel sounds positive.  ?Musculoskeletal: no clubbing / cyanosis. No joint deformity upper and lower extremities. Good ROM, no contractures. Normal muscle tone.  ?Skin: no rashes, lesions, ulcers. No induration ?Neurologic: CN 2-12 grossly intact. Sensation intact, DTR normal. Strength 5/5 in all 4.  ?Psychiatric: Normal judgment and insight. Alert and oriented x 3. Normal mood.  ? ?Data Reviewed: ? ?HGB 9.9 unchanged from earlier today. ? ?Assessment and Plan: ?* Acute GI bleeding ?Likely recurrent diverticular bleeding.  Discharged for same earlier today. ?Repeat CBC in AM ?If another episode of repeat hematochezia: plan = stat CTA abd/pelvis to see if we can localize the likely diverticular bleed ?If positive CTA => call IR for embolization ?Type and screen ?Tele monitor ?Transfuse if needed ?EDP going to  send message to [facility] to let them know about re-admit (though I presume this is more of an IR issue if we can catch (localize) the presumed diverticular bleeding on CTA). ?Clear liquid diet only for the moment ? ?Type 2 diabetes mellitus (HCC) ?Hold home PO hypoglycemics. ?Use sensitive SSI Q4H ? ? ? ? ? Advance Care Planning:   Code Status: Full Code ? ?Consults: EDP putting in consult to [facility] ? ?Family Communication: No family in room ? ?Severity of Illness: ?The appropriate patient status for this patient is INPATIENT. Inpatient status is judged to be reasonable and necessary in order to provide the required intensity of service to ensure the patient's safety. The patient's presenting symptoms, physical exam findings, and initial radiographic and laboratory data in the context of their chronic comorbidities is felt to place them at high risk for further clinical deterioration. Furthermore, it is not  anticipated that the patient will be medically stable for discharge from the hospital within 2 midnights of admission. ? ?Pt has clearly failed outpt management of what is likely diverticular bleeding. ? ?* I certify that at the point of admission it is my clinical judgment that the patient will require inpatient hospital care spanning beyond 2 midnights from the point of admission due to high intensity of service, high risk for further deterioration and high frequency of surveillance required.* ? ?Author: ?Hillary Bow., DO ?08/12/2021 10:55 PM ? ?For on call review [email or web address].  ?

## 2021-08-12 NOTE — Assessment & Plan Note (Addendum)
Likely recurrent diverticular bleeding.  Discharged for same earlier today. ?1. Repeat CBC in AM ?2. If another episode of repeat hematochezia: plan = get repeat stat CTA abd/pelvis to see if we can localize the likely diverticular bleed (have let RN know) ?1. If positive CTA => call IR for embolization ?3. Type and screen ?4. Tele monitor ?5. Transfuse if needed ?6. EDP going to send message to [facility] to let them know about re-admit (though I presume this is more of an IR issue if we can catch (localize) the presumed diverticular bleeding on CTA). ?7. Clear liquid diet only for the moment ?

## 2021-08-13 DIAGNOSIS — K5731 Diverticulosis of large intestine without perforation or abscess with bleeding: Secondary | ICD-10-CM | POA: Diagnosis not present

## 2021-08-13 DIAGNOSIS — D62 Acute posthemorrhagic anemia: Secondary | ICD-10-CM | POA: Diagnosis not present

## 2021-08-13 DIAGNOSIS — N400 Enlarged prostate without lower urinary tract symptoms: Secondary | ICD-10-CM | POA: Diagnosis not present

## 2021-08-13 DIAGNOSIS — K921 Melena: Secondary | ICD-10-CM

## 2021-08-13 DIAGNOSIS — K922 Gastrointestinal hemorrhage, unspecified: Secondary | ICD-10-CM | POA: Diagnosis not present

## 2021-08-13 DIAGNOSIS — E119 Type 2 diabetes mellitus without complications: Secondary | ICD-10-CM | POA: Diagnosis not present

## 2021-08-13 LAB — CBC
HCT: 25.5 % — ABNORMAL LOW (ref 39.0–52.0)
HCT: 27.4 % — ABNORMAL LOW (ref 39.0–52.0)
Hemoglobin: 8.3 g/dL — ABNORMAL LOW (ref 13.0–17.0)
Hemoglobin: 8.9 g/dL — ABNORMAL LOW (ref 13.0–17.0)
MCH: 32.4 pg (ref 26.0–34.0)
MCH: 32.8 pg (ref 26.0–34.0)
MCHC: 32.5 g/dL (ref 30.0–36.0)
MCHC: 32.5 g/dL (ref 30.0–36.0)
MCV: 99.6 fL (ref 80.0–100.0)
MCV: 101.1 fL — ABNORMAL HIGH (ref 80.0–100.0)
Platelets: 221 10*3/uL (ref 150–400)
Platelets: 260 10*3/uL (ref 150–400)
RBC: 2.56 MIL/uL — ABNORMAL LOW (ref 4.22–5.81)
RBC: 2.71 MIL/uL — ABNORMAL LOW (ref 4.22–5.81)
RDW: 12.1 % (ref 11.5–15.5)
RDW: 12.1 % (ref 11.5–15.5)
WBC: 7.2 10*3/uL (ref 4.0–10.5)
WBC: 7 10*3/uL (ref 4.0–10.5)
nRBC: 0 % (ref 0.0–0.2)
nRBC: 0 % (ref 0.0–0.2)

## 2021-08-13 LAB — RETICULOCYTES
Immature Retic Fract: 25.5 % — ABNORMAL HIGH (ref 2.3–15.9)
RBC.: 2.56 MIL/uL — ABNORMAL LOW (ref 4.22–5.81)
Retic Count, Absolute: 59.1 10*3/uL (ref 19.0–186.0)
Retic Ct Pct: 2.3 % (ref 0.4–3.1)

## 2021-08-13 LAB — BASIC METABOLIC PANEL
Anion gap: 9 (ref 5–15)
BUN: 14 mg/dL (ref 8–23)
CO2: 27 mmol/L (ref 22–32)
Calcium: 8.6 mg/dL — ABNORMAL LOW (ref 8.9–10.3)
Chloride: 100 mmol/L (ref 98–111)
Creatinine, Ser: 0.93 mg/dL (ref 0.61–1.24)
GFR, Estimated: >60 mL/min (ref >60)
Glucose, Bld: 117 mg/dL — ABNORMAL HIGH (ref 70–99)
Potassium: 3.6 mmol/L (ref 3.5–5.1)
Sodium: 136 mmol/L (ref 135–145)

## 2021-08-13 LAB — IRON AND TIBC
Iron: 41 ug/dL — ABNORMAL LOW (ref 45–182)
Saturation Ratios: 13 % — ABNORMAL LOW (ref 17.9–39.5)
TIBC: 309 ug/dL (ref 250–450)
UIBC: 268 ug/dL

## 2021-08-13 LAB — FERRITIN: Ferritin: 45 ng/mL (ref 24–336)

## 2021-08-13 LAB — GLUCOSE, CAPILLARY
Glucose-Capillary: 130 mg/dL — ABNORMAL HIGH (ref 70–99)
Glucose-Capillary: 112 mg/dL — ABNORMAL HIGH (ref 70–99)
Glucose-Capillary: 108 mg/dL — ABNORMAL HIGH (ref 70–99)

## 2021-08-13 LAB — FOLATE: Folate: 17.7 ng/mL (ref >5.9)

## 2021-08-13 LAB — VITAMIN B12: Vitamin B-12: 570 pg/mL (ref 180–914)

## 2021-08-13 NOTE — Consult Note (Addendum)
? ? ? ? ? Consultation ? ?Referring Provider:   Dr. Jeoffrey Massed ?Primary Care Physician:  Ralene Ok, MD ?Primary Gastroenterologist:  Dr. Rhea Belton       ?Reason for Consultation:     GI bleed, hypertension, syncope ? ? Attending physician's note  ? ?I have taken a history, reviewed the chart and examined the patient. I performed a substantive portion of this encounter, including complete performance of at least one of the key components, in conjunction with the APP. I agree with the APP's note, impression and recommendations.  ? ?Patiently presented to ER after an episode of large-volume hematochezia ? ?Hemoglobin trended down to 8.3 this morning ?He is hemodynamically stable ? ?Likely etiology of hematochezia is diverticular hemorrhage, will continue to monitor.  If he has a repeat episode of large-volume hematochezia plan for CT angio or GI bleeding scan ? ?Monitor hemoglobin and transfuse if below 7 ?Discussed with Dr. Jerral Ralph, will discuss with pharmacy if he can get IV Feraheme during this hospitalization given he had significant blood loss ? ?Advance diet to full liquids this afternoon and if no further bleeding will advance to soft diet tomorrow a.m. ? ?GI will follow along ?  ? The patient was provided an opportunity to ask questions and all were answered. The patient agreed with the plan and demonstrated an understanding of the instructions. ? ? ?K. Scherry Ran , MD ?[phone]   ? ? ? Impression   ? ?Recurrent GI bleed ?Patient with multiple admissions for GI bleeds attributed to diverticulosis discharged 08/12/2021, readmitted same day after rectal bleeding with hypotension possible vasovagal episode. ?Hemoglobin 9.9-8.3 this morning.  Elevation of BUN or creatinine. ?Patient without any further bleeding this morning, minimal abdominal pain ?Colonoscopy 01/22/2020 diverticulosis otherwise normal ?Most likely recurrence of diverticular bleed, no leukocytosis, no fevers no chills nausea or  vomiting, less likely possibility of ischemic colitis versus diverticulitis. ? ?Anemia ?CBC on 08/13/2021   ?WBC 7.2 HGB 8.3 MCV 99.6 Platelets 221 ?Anemia studies on 01/20/2020  ?Iron 51 Ferritin 25 B12 536 ? ?Type 2 diabetes ?Monitor sugars ? ?Hypotension ?Possible vasovagal reaction has resolved ? ? Plan  ? ?- Continue liquid diet today, can advance to soft diet tomorrow ?--Continue to monitor H&H with transfusion as needed to maintain hemoglobin greater than 7. ?-Can repeat iron studies see if patient would potentially benefit from oral iron outpatient. ?-Recommend proceeding with CTA IF there are any further episodes of bleeding and IR consultation if positive.  If patient develops leukocytosis, worsening abdominal pain suggest repeating CT abdomen pelvis to evaluate for ischemic colitis or diverticulitis. ?-Get back on fiber supplement daily outpatient ?-Continue supportive care and monitoring, further recommendations per Dr. Lavon Paganini ? ?Thank you for your kind consultation, we will continue to follow. ?       ? HPI:   ?Derrick Thornton is a 76 y.o. male with past medical history significant for type 2 diabetes, hypertension, Barrett's esophagus, diverticulosis, hiatal hernia, internal hemorrhoids, history of recurrent GI bleed attributed to diverticulosis, most recent admission in 08/11/2021.   ?Status post left inguinal hernia repair 04/2021. ?Patient's had admissions for recurrent GI bleed on 2019 receiving 5 pack of blood cell units, 06/2019, 12/2019 2 packed red blood cells, 12/2019 2 packed red blood cells, 03/2020 small-volume hematochezia, and most recently 08/11/2021 for painless hematochezia.  Patient had negative CT angio for active bleeding, monitored with out any further episodes of hematochezia and stable hemoglobin. ?Patient was discharged 08/12/2021 with hemoglobin 9.5 at  that time. ?Patient presented to the ER again yesterday with recurrent rectal bleeding, lower abdominal discomfort,  tachycardia and hypotension in the ER. ?Hemoglobin in the ER 9.9 and repeat 8.3 this morning, BUN 14, creatinine 0.93 ? ?Patient states drove self home yesterday as his wife is sick at home with diarrhea x 3 days.  ?He laid down after eating an [place] had some intestinal grumbling's and when patient went to stand to go to the bathroom felt warmth at his rectum with bright red blood. ?Patient denies overt abdominal pain but was having small pinpricks of discomfort. ?Patient denies shortness of breath or chest pain, did have some diaphoresis. ?Drove himself to the ER when he started to have dizziness, had some hypotension and tachycardia there which resolved. ?Patient states he had 1 more bright red large-volume bloody bowel movement in the ER. ?Has not had any more bowel movement since that time, denies any abdominal pain, fever, chills. ?Denies nausea, vomiting is on current liquid diet and states he is hungry. ?Patient denies any recent anti-inflammatory use. ?Patient was on Metamucil fiber supplement 3 days a week but has not been taking outpatient. ? ?08/10/2021 CTAP w angio: No active bleeding.  Mild aortic atherosclerosis.  Extensive colonic diverticulosis.  Colonic wall thickening and surrounding fat stranding in the region of multiple giant diverticula at the sigmoid suggest acute diverticulitis but no free air or abscess visualized.  Fat-containing umbilical hernia. ?01/20/2020 CTAP and 01/22/2020 CTAP with angiography showed extensive diverticulosis but no active bleeding. ?01/22/2020 EGD: 3 cm HH.  Mucosal changes associated with short segment Barrett's disease, biopsied   Stomach, duodenum normal.  No source for upper GI bleeding.  Path: Acute and chronic inflammation, intestinal metaplasia, "with proper endoscopic findings this is diagnostic of Barrett's esophagus". ?01/22/2020 colonoscopy w fair prep revealed left and right colon diverticulosis.  Blood from splenic flexure to rectum but exact source of  bleeding not located.  Dr. Myrtie Neitheranis suspected diverticular bleed most likely from the left colon. ? ?Family history negative for colon, intestinal, pancreas, esophageal cancer. ?[place] ? ?Past Medical History:  ?Diagnosis Date  ? Anemia   ? Aortic atherosclerosis (HCC)   ? Barrett's esophagus   ? DDD (degenerative disc disease), lumbar   ? Diabetes mellitus without complication (HCC)   ? Diverticulitis   ? Diverticulosis   ? GI bleed   ? Hiatal hernia   ? Hyperplastic colon polyp   ? Hypertension   ? Internal hemorrhoids   ? Nephrolithiasis   ? ? ?Surgical History:  ?He  has a past surgical history that includes Knee arthroscopy; Achilles tendon repair; Esophagogastroduodenoscopy (egd) with propofol (N/A, 01/22/2020); Colonoscopy with propofol (N/A, 01/22/2020); biopsy (01/22/2020); and Inguinal hernia repair (Left, 05/05/2021). ?Family History:  ?His family history includes Stroke in his father. ?Social History:  ? reports that he has never smoked. He has never used smokeless tobacco. He reports current alcohol use. He reports that he does not use drugs. ? ?Prior to Admission medications   ?Medication Sig Start Date End Date Taking? Authorizing Provider  ?Ascorbic Acid (VITAMIN C WITH ROSE HIPS) 500 MG tablet Take 500 mg by mouth 3 (three) times a week. No set days   Yes [provider]  ?folic acid (FOLVITE) 400 MCG tablet Take 400 mcg by mouth 3 (three) times a week.   Yes [provider]  ?glimepiride (AMARYL) 4 MG tablet Take 4 mg by mouth daily as needed. If CBG  is >  150   Yes [provider]  ?ibuprofen (ADVIL) 800 MG tablet Take 1 tablet (800 mg total) by mouth every 8 (eight) hours as needed. ?Patient taking differently: Take 800 mg by mouth every 8 (eight) hours as needed for mild pain. 05/05/21  Yes Cornett, Maisie Fus, MD  ?metFORMIN (GLUCOPHAGE) 500 MG tablet Take 500 mg by mouth at bedtime. 12/22/19  Yes [provider]  ?omeprazole (PRILOSEC) 40 MG capsule  Take 1 capsule (40 mg total) by mouth daily. 01/24/20  Yes Simmons-Robinson, Makiera, MD  ?OVER THE COUNTER MEDICATION Take 1 tablet by mouth every other day. Ashwabright Mood Plus   Yes [provider]  ?OVE

## 2021-08-13 NOTE — Plan of Care (Signed)

## 2021-08-13 NOTE — Progress Notes (Signed)
Mobility Specialist Progress Note: ? ? 08/13/21 1039  ?Mobility  ?Activity Ambulated with assistance in hallway  ?Level of Assistance Independent  ?Assistive Device None  ?Distance Ambulated (ft) 1120 ft  ?Activity Response Tolerated well  ?$Mobility charge 1 Mobility  ? ?Pt received in bed willing to participate in mobility. No complaints of pain. Pt left in chair with call bell in reach and all needs met.  ? ?Derrick Thornton ?Mobility Specialist ?Primary Phone [phone] ? ?

## 2021-08-13 NOTE — Progress Notes (Signed)
?      ?                 PROGRESS NOTE ? ?      ?PATIENT DETAILS ?Name: Derrick Thornton ?Age: 76 y.o. ?Sex: male ?Date of Birth: 06/24/1945 ?Admit Date: 08/12/2021 ?Admitting Physician Hillary BowJared M Gardner, DO ?AVW:[id number]:Moreira, Channing Muttersoy, MD ? ?Brief Summary: ?76 year old with a history of BPH, DM-2, prior diverticular bleeding-presented with painless hematochezia-thought to have recurrent lower GI bleeding due to diverticulosis. ? ?Significant events: ?3/13-3/15>> hospitalization for diverticular bleeding-managed with supportive care.  Discharged on 3/15. ?3/15>> presented back to the ED in a few hours postdischarge with hematochezia x1 at home-briefly hypotensive/syncope with another episode of hematochezia in the ED. ? ?Significant studies: ?3/13>> CT angio abdomen: No active bleeding.  Extensive colonic diverticulosis.  Possible acute diverticulitis. ? ?Significant microbiology data: ?3/14>> COVID/influenza PCR: Negative ? ?Procedures: ?None ? ?Consults: ?GI ? ?Subjective: ?No further episodes of hematochezia since yesterday (had 2 episodes post discharge yesterday-1 at home and 1 in the ED) ? ?Objective: ?Vitals: ?Blood pressure 114/79, pulse 68, temperature 97.8 ?F (36.6 ?C), temperature source Oral, resp. rate 18, height 5\' 9"  (1.753 m), weight 76.1 kg, SpO2 99 %.  ? ?Exam: ?Gen Exam:Alert awake-not in any distress ?HEENT:atraumatic, normocephalic ?Chest: B/L clear to auscultation anteriorly ?CVS:S1S2 regular ?Abdomen:soft non tender, non distended ?Extremities:no edema ?Neurology: Non focal ?Skin: no rash ? ?Pertinent Labs/Radiology: ?CBC Latest Ref Rng & Units 08/13/2021 08/12/2021 08/12/2021  ?WBC 4.0 - 10.5 K/uL 7.2 8.6 4.9  ?Hemoglobin 13.0 - 17.0 g/dL 8.3(L) 9.9(L) 9.5(L)  ?Hematocrit 39.0 - 52.0 % 25.5(L) 32.1(L) 28.8(L)  ?Platelets 150 - 400 K/uL 221 283 227  ?  ?Lab Results  ?Component Value Date  ? NA 136 08/13/2021  ? K 3.6 08/13/2021  ? CL 100 08/13/2021  ? CO2 27 08/13/2021  ?  ? ? ?Assessment/Plan: ?Recurrent  lower GI bleeding with acute blood loss anemia: Unfortunately rehospitalized yesterday post discharge due to recurrent hematochezia.  Etiology likely diverticular bleeding.  Continue to follow CBC and transfuse if significant drop, if he has further episodes of hematochezia-we will proceed with a CT angio abdomen.  Appreciate GI input. ? ?Transient hypotension/syncope: Due to blood loss/GI bleeding-resolved-given transient nature-and clear-cut etiology-doubt any further work-up required. ?  ?DM-2: CBGs stable with SSI-resume metformin and Amaryl on discharge. ? ?Recent Labs  ?  08/10/21 ?2037  ?GLUCAP 145*  ?  ? ?HLD: Continue statin ?  ?BPH: Continue Flomax ?  ?BMI: ?Estimated body mass index is 24.78 kg/m? as calculated from the following: ?  Height as of this encounter: 5\' 9"  (1.753 m). ?  Weight as of this encounter: 76.1 kg.  ? ?Code status: ?  Code Status: Full Code  ? ?DVT Prophylaxis: ?SCDs Start: 08/12/21 2232 ?  ?Family Communication: None at bedside ? ? ?Disposition Plan: ?Status is: Inpatient ?Remains inpatient appropriate because: Recurrent GI bleeding-blood loss anemia-need to monitor for another 24 to 48 hours before consideration of discharge-especially given that this is his second hospitalization. ?  ?Planned Discharge Destination:Home ? ? ?Diet: ?Diet Order   ? ?       ?  Diet full liquid Room service appropriate? Yes; Fluid consistency: Thin  Diet effective now       ?  ? ?  ?  ? ?  ?  ? ? ?Antimicrobial agents: ?Anti-infectives (From admission, onward)  ? ? None  ? ?  ? ? ? ?MEDICATIONS: ?Scheduled Meds: ? insulin aspart  0-15 Units Subcutaneous TID WC  ? pantoprazole  80 mg Oral Daily  ? rosuvastatin  10 mg Oral QHS  ? tamsulosin  0.4 mg Oral Daily  ? ?Continuous Infusions: ? lactated ringers 125 mL/hr at 08/13/21 0405  ? ?PRN Meds:.acetaminophen **OR** acetaminophen, ondansetron **OR** ondansetron (ZOFRAN) IV ? ? ?I have personally reviewed following labs and imaging studies ? ?LABORATORY  DATA: ?CBC: ?Recent Labs  ?Lab 08/10/21 ?2047 08/10/21 ?2143 08/11/21 ?1610 08/11/21 ?1020 08/12/21 ?0424 08/12/21 ?2053 08/13/21 ?9604  ?WBC 4.7  --  6.0 6.5 4.9 8.6 7.2  ?NEUTROABS 1.7  --   --   --   --   --   --   ?HGB 11.8*   < > 10.3* 9.9* 9.5* 9.9* 8.3*  ?HCT 36.7*   < > 31.0* 30.7* 28.8* 32.1* 25.5*  ?MCV 102.8*  --  98.4 100.0 100.7* 103.9* 99.6  ?PLT 254  --  252 236 227 283 221  ? < > = values in this interval not displayed.  ? ? ?Basic Metabolic Panel: ?Recent Labs  ?Lab 08/10/21 ?2047 08/10/21 ?2143 08/12/21 ?0424 08/12/21 ?2053 08/13/21 ?5409  ?NA 138 139 138 137 136  ?K 3.6 3.5 4.0 3.7 3.6  ?CL 102 103 103 101 100  ?CO2 22  --  27 25 27   ?GLUCOSE 188* 185* 112* 148* 117*  ?BUN 11 13 12 13 14   ?CREATININE 1.21 1.10 0.96 1.01 0.93  ?CALCIUM 8.9  --  8.6* 9.1 8.6*  ? ? ?GFR: ?Estimated Creatinine Clearance: 70.7 mL/min (by C-G formula based on SCr of 0.93 mg/dL). ? ?Liver Function Tests: ?Recent Labs  ?Lab 08/10/21 ?2047 08/12/21 ?2053  ?AST 26 26  ?ALT 17 18  ?ALKPHOS 64 66  ?BILITOT 0.7 0.4  ?PROT 6.8 6.9  ?ALBUMIN 3.6 3.7  ? ?No results for input(s): LIPASE, AMYLASE in the last 168 hours. ?No results for input(s): AMMONIA in the last 168 hours. ? ?Coagulation Profile: ?Recent Labs  ?Lab 08/10/21 ?2047  ?INR 1.0  ? ? ?Cardiac Enzymes: ?No results for input(s): CKTOTAL, CKMB, CKMBINDEX, TROPONINI in the last 168 hours. ? ?BNP (last 3 results) ?No results for input(s): PROBNP in the last 8760 hours. ? ?Lipid Profile: ?No results for input(s): CHOL, HDL, LDLCALC, TRIG, CHOLHDL, LDLDIRECT in the last 72 hours. ? ?Thyroid Function Tests: ?No results for input(s): TSH, T4TOTAL, FREET4, T3FREE, THYROIDAB in the last 72 hours. ? ?Anemia Panel: ?No results for input(s): VITAMINB12, FOLATE, FERRITIN, TIBC, IRON, RETICCTPCT in the last 72 hours. ? ?Urine analysis: ?No results found for: COLORURINE, APPEARANCEUR, LABSPEC, PHURINE, GLUCOSEU, HGBUR, BILIRUBINUR, KETONESUR, PROTEINUR, UROBILINOGEN, NITRITE,  LEUKOCYTESUR ? ?Sepsis Labs: ?Lactic Acid, Venous ?No results found for: LATICACIDVEN ? ?MICROBIOLOGY: ?Recent Results (from the past 240 hour(s))  ?Resp Panel by RT-PCR (Flu A&B, Covid) Nasopharyngeal Swab     Status: None  ? Collection Time: 08/11/21  1:25 AM  ? Specimen: Nasopharyngeal Swab; Nasopharyngeal(NP) swabs in vial transport medium  ?Result Value Ref Range Status  ? SARS Coronavirus 2 by RT PCR NEGATIVE NEGATIVE Final  ?  Comment: (NOTE) ?SARS-CoV-2 target nucleic acids are NOT DETECTED. ? ?The SARS-CoV-2 RNA is generally detectable in upper respiratory ?specimens during the acute phase of infection. The lowest ?concentration of SARS-CoV-2 viral copies this assay can detect is ?138 copies/mL. A negative result does not preclude SARS-Cov-2 ?infection and should not be used as the sole basis for treatment or ?other patient management decisions. A negative result may occur with  ?improper specimen collection/handling,  submission of specimen other ?than nasopharyngeal swab, presence of viral mutation(s) within the ?areas targeted by this assay, and inadequate number of viral ?copies(<138 copies/mL). A negative result must be combined with ?clinical observations, patient history, and epidemiological ?information. The expected result is Negative. ? ?Fact Sheet for Patients:  ?BloggerCourse.com ? ?Fact Sheet for Healthcare Providers:  ?SeriousBroker.it ? ?This test is no t yet approved or cleared by the [place] FDA and  ?has been authorized for detection and/or diagnosis of SARS-CoV-2 by ?FDA under an Emergency Use Authorization (EUA). This EUA will remain  ?in effect (meaning this test can be used) for the duration of the ?COVID-19 declaration under Section 564(b)(1) of the Act, 21 ?U.S.C.section 360bbb-3(b)(1), unless the authorization is terminated  ?or revoked sooner.  ? ? ?  ? Influenza A by PCR NEGATIVE NEGATIVE Final  ? Influenza B by PCR NEGATIVE  NEGATIVE Final  ?  Comment: (NOTE) ?The Xpert Xpress SARS-CoV-2/FLU/RSV plus assay is intended as an aid ?in the diagnosis of influenza from Nasopharyngeal swab specimens and ?should not be used as a sole basis for trea

## 2021-08-14 DIAGNOSIS — K5731 Diverticulosis of large intestine without perforation or abscess with bleeding: Secondary | ICD-10-CM

## 2021-08-14 DIAGNOSIS — D62 Acute posthemorrhagic anemia: Secondary | ICD-10-CM | POA: Diagnosis not present

## 2021-08-14 DIAGNOSIS — K922 Gastrointestinal hemorrhage, unspecified: Secondary | ICD-10-CM | POA: Diagnosis not present

## 2021-08-14 DIAGNOSIS — N400 Enlarged prostate without lower urinary tract symptoms: Secondary | ICD-10-CM | POA: Diagnosis not present

## 2021-08-14 DIAGNOSIS — E119 Type 2 diabetes mellitus without complications: Secondary | ICD-10-CM | POA: Diagnosis not present

## 2021-08-14 LAB — PREPARE RBC (CROSSMATCH)

## 2021-08-14 LAB — GLUCOSE, CAPILLARY
Glucose-Capillary: 120 mg/dL — ABNORMAL HIGH (ref 70–99)
Glucose-Capillary: 131 mg/dL — ABNORMAL HIGH (ref 70–99)
Glucose-Capillary: 91 mg/dL (ref 70–99)
Glucose-Capillary: 123 mg/dL — ABNORMAL HIGH (ref 70–99)
Glucose-Capillary: 127 mg/dL — ABNORMAL HIGH (ref 70–99)

## 2021-08-14 LAB — CBC
HCT: 22.7 % — ABNORMAL LOW (ref 39.0–52.0)
Hemoglobin: 7.1 g/dL — ABNORMAL LOW (ref 13.0–17.0)
MCH: 31.6 pg (ref 26.0–34.0)
MCHC: 31.3 g/dL (ref 30.0–36.0)
MCV: 100.9 fL — ABNORMAL HIGH (ref 80.0–100.0)
Platelets: 230 10*3/uL (ref 150–400)
RBC: 2.25 MIL/uL — ABNORMAL LOW (ref 4.22–5.81)
RDW: 12.1 % (ref 11.5–15.5)
WBC: 5.6 10*3/uL (ref 4.0–10.5)
nRBC: 0 % (ref 0.0–0.2)

## 2021-08-14 LAB — HEMOGLOBIN A1C
Hgb A1c MFr Bld: 5.3 % (ref 4.8–5.6)
Mean Plasma Glucose: 105 mg/dL

## 2021-08-14 LAB — HEMOGLOBIN: Hemoglobin: 8.3 g/dL — ABNORMAL LOW (ref 13.0–17.0)

## 2021-08-14 MED ORDER — ACETAMINOPHEN 325 MG PO TABS
650.0000 mg | ORAL_TABLET | Freq: Once | ORAL | Status: AC
  Administered 2021-08-14: 650 mg via ORAL
  Filled 2021-08-14: qty 2

## 2021-08-14 MED ORDER — DIPHENHYDRAMINE HCL 50 MG/ML IJ SOLN
25.0000 mg | Freq: Once | INTRAMUSCULAR | Status: AC
  Administered 2021-08-14: 25 mg via INTRAVENOUS
  Filled 2021-08-14: qty 1

## 2021-08-14 MED ORDER — FUROSEMIDE 10 MG/ML IJ SOLN
20.0000 mg | Freq: Once | INTRAMUSCULAR | Status: AC
  Administered 2021-08-14: 20 mg via INTRAVENOUS
  Filled 2021-08-14: qty 2

## 2021-08-14 MED ORDER — SODIUM CHLORIDE 0.9 % IV SOLN
250.0000 mg | Freq: Every day | INTRAVENOUS | Status: AC
  Administered 2021-08-14 – 2021-08-15 (×2): 250 mg via INTRAVENOUS
  Filled 2021-08-14 (×2): qty 20

## 2021-08-14 MED ORDER — SODIUM CHLORIDE 0.9% IV SOLUTION: Freq: Once | INTRAVENOUS | Status: AC

## 2021-08-14 NOTE — Progress Notes (Signed)
Mobility Specialist Progress Note: ? ? 08/14/21 1100  ?Mobility  ?Activity Ambulated with assistance in hallway  ?Level of Assistance Independent  ?Assistive Device None  ?Distance Ambulated (ft) 1650 ft  ?Activity Response Tolerated well  ?$Mobility charge 1 Mobility  ? ?Pt received in bed willing to participate in mobility. No complaints of pain. Pt left in bed with call bell in reach and all needs met.  ? ?Derrick Thornton ?Mobility Specialist ?Primary Phone [phone] ? ?

## 2021-08-14 NOTE — Plan of Care (Signed)
  Problem: Nutrition: Goal: Adequate nutrition will be maintained Outcome: Progressing   Problem: Coping: Goal: Level of anxiety will decrease Outcome: Progressing   Problem: Pain Managment: Goal: General experience of comfort will improve Outcome: Progressing   

## 2021-08-14 NOTE — Progress Notes (Signed)
?      ?                 PROGRESS NOTE ? ?      ?PATIENT DETAILS ?Name: Derrick Thornton ?Age: 76 y.o. ?Sex: male ?Date of Birth: 1945-08-18 ?Admit Date: 08/12/2021 ?Admitting Physician Etta Quill, DO ?[id number], Carloyn Manner, MD ? ?Brief Summary: ?76 year old with a history of BPH, DM-2, prior diverticular bleeding-presented with painless hematochezia-thought to have recurrent lower GI bleeding due to diverticulosis. ? ?Significant events: ?3/13-3/15>> hospitalization for diverticular bleeding-managed with supportive care.  Discharged on 3/15. ?3/15>> presented back to the ED in a few hours postdischarge with hematochezia x1 at home-briefly hypotensive/syncope with another episode of hematochezia in the ED. ? ?Significant studies: ?3/13>> CT angio abdomen: No active bleeding.  Extensive colonic diverticulosis.  Possible acute diverticulitis. ? ?Significant microbiology data: ?3/14>> COVID/influenza PCR: Negative ? ?Procedures: ?None ? ?Consults: ?GI ? ?Subjective: ?Had "dark BM" last night-not bright red that he had before ? ?Objective: ?Vitals: ?Blood pressure 131/75, pulse 73, temperature 98.3 ?F (36.8 ?C), temperature source Oral, resp. rate 17, height 5\' 9"  (1.753 m), weight 76.1 kg, SpO2 98 %.  ? ?Exam: ?Gen Exam:Alert awake-not in any distress ?HEENT:atraumatic, normocephalic ?Chest: B/L clear to auscultation anteriorly ?CVS:S1S2 regular ?Abdomen:soft non tender, non distended ?Extremities:no edema ?Neurology: Non focal ?Skin: no rash  ? ?Pertinent Labs/Radiology: ?CBC Latest Ref Rng & Units 08/14/2021 08/13/2021 08/13/2021  ?WBC 4.0 - 10.5 K/uL 5.6 7.0 7.2  ?Hemoglobin 13.0 - 17.0 g/dL 7.1(L) 8.9(L) 8.3(L)  ?Hematocrit 39.0 - 52.0 % 22.7(L) 27.4(L) 25.5(L)  ?Platelets 150 - 400 K/uL 230 260 221  ?  ?Lab Results  ?Component Value Date  ? NA 136 08/13/2021  ? K 3.6 08/13/2021  ? CL 100 08/13/2021  ? CO2 27 08/13/2021  ?  ? ? ?Assessment/Plan: ?Recurrent lower GI bleeding with acute blood loss anemia: Had dark colored  stool last night-but not fresh blood-significant drop in Hb today-will go ahead and transfuse 1 unit of PRBC today. Follow closely-if he has  further clear cut episodes of hematochezia-we will proceed with a CT angio abdomen.  Appreciate GI input. ? ?Transient hypotension/syncope: Due to blood loss/GI bleeding-resolved-given transient nature-and clear-cut etiology-doubt any further work-up required. ?  ?DM-2: CBGs stable with SSI-resume metformin and Amaryl on discharge. ? ?Recent Labs  ?  08/13/21 ?1635 08/13/21 ?2138 08/14/21 ?0500  ?GLUCAP 112* 108* 120*  ? ?  ? ?HLD: Continue statin ?  ?BPH: Continue Flomax ?  ?BMI: ?Estimated body mass index is 24.78 kg/m? as calculated from the following: ?  Height as of this encounter: 5\' 9"  (1.753 m). ?  Weight as of this encounter: 76.1 kg.  ? ?Code status: ?  Code Status: Full Code  ? ?DVT Prophylaxis: ?SCDs Start: 08/12/21 2232 ?  ?Family Communication: None at bedside ? ? ?Disposition Plan: ?Status is: Inpatient ?Remains inpatient appropriate because: Recurrent GI bleeding-blood loss anemia-need to monitor for another 24 to 48 hours before consideration of discharge-especially given that this is his second hospitalization. ?  ?Planned Discharge Destination:Home ? ? ?Diet: ?Diet Order   ? ?       ?  Diet full liquid Room service appropriate? Yes; Fluid consistency: Thin  Diet effective now       ?  ? ?  ?  ? ?  ?  ? ? ?Antimicrobial agents: ?Anti-infectives (From admission, onward)  ? ? None  ? ?  ? ? ? ?MEDICATIONS: ?Scheduled Meds: ? insulin  aspart  0-15 Units Subcutaneous TID WC  ? pantoprazole  80 mg Oral Daily  ? rosuvastatin  10 mg Oral QHS  ? tamsulosin  0.4 mg Oral Daily  ? ?Continuous Infusions: ? lactated ringers 10 mL/hr at 08/13/21 1404  ? ?PRN Meds:.acetaminophen **OR** acetaminophen, ondansetron **OR** ondansetron (ZOFRAN) IV ? ? ?I have personally reviewed following labs and imaging studies ? ?LABORATORY DATA: ?CBC: ?Recent Labs  ?Lab 08/10/21 ?2047  08/10/21 ?2143 08/12/21 ?0424 08/12/21 ?2053 08/13/21 ?[id number] 08/13/21 ?1503 08/14/21 ?0147  ?WBC 4.7   < > 4.9 8.6 7.2 7.0 5.6  ?NEUTROABS 1.7  --   --   --   --   --   --   ?HGB 11.8*   < > 9.5* 9.9* 8.3* 8.9* 7.1*  ?HCT 36.7*   < > 28.8* 32.1* 25.5* 27.4* 22.7*  ?MCV 102.8*   < > 100.7* 103.9* 99.6 101.1* 100.9*  ?PLT 254   < > 227 283 221 260 230  ? < > = values in this interval not displayed.  ? ? ? ?Basic Metabolic Panel: ?Recent Labs  ?Lab 08/10/21 ?2047 08/10/21 ?2143 08/12/21 ?0424 08/12/21 ?2053 08/13/21 ?[id number]  ?NA 138 139 138 137 136  ?K 3.6 3.5 4.0 3.7 3.6  ?CL 102 103 103 101 100  ?CO2 22  --  27 25 27   ?GLUCOSE 188* 185* 112* 148* 117*  ?BUN 11 13 12 13 14   ?CREATININE 1.21 1.10 0.96 1.01 0.93  ?CALCIUM 8.9  --  8.6* 9.1 8.6*  ? ? ? ?GFR: ?Estimated Creatinine Clearance: 70.7 mL/min (by C-G formula based on SCr of 0.93 mg/dL). ? ?Liver Function Tests: ?Recent Labs  ?Lab 08/10/21 ?2047 08/12/21 ?2053  ?AST 26 26  ?ALT 17 18  ?ALKPHOS 64 66  ?BILITOT 0.7 0.4  ?PROT 6.8 6.9  ?ALBUMIN 3.6 3.7  ? ? ?No results for input(s): LIPASE, AMYLASE in the last 168 hours. ?No results for input(s): AMMONIA in the last 168 hours. ? ?Coagulation Profile: ?Recent Labs  ?Lab 08/10/21 ?2047  ?INR 1.0  ? ? ? ?Cardiac Enzymes: ?No results for input(s): CKTOTAL, CKMB, CKMBINDEX, TROPONINI in the last 168 hours. ? ?BNP (last 3 results) ?No results for input(s): PROBNP in the last 8760 hours. ? ?Lipid Profile: ?No results for input(s): CHOL, HDL, LDLCALC, TRIG, CHOLHDL, LDLDIRECT in the last 72 hours. ? ?Thyroid Function Tests: ?No results for input(s): TSH, T4TOTAL, FREET4, T3FREE, THYROIDAB in the last 72 hours. ? ?Anemia Panel: ?Recent Labs  ?  08/13/21 ?[id number]  ?VITAMINB12 570  ?FOLATE 17.7  ?FERRITIN 45  ?TIBC 309  ?IRON 41*  ?RETICCTPCT 2.3  ? ? ?Urine analysis: ?No results found for: COLORURINE, APPEARANCEUR, [place], [place], [place], [place], [place], KETONESUR, PROTEINUR, [place], NITRITE, LEUKOCYTESUR ? ?Sepsis  Labs: ?Lactic Acid, Venous ?No results found for: LATICACIDVEN ? ?MICROBIOLOGY: ?Recent Results (from the past 240 hour(s))  ?Resp Panel by RT-PCR (Flu A&B, Covid) Nasopharyngeal Swab     Status: None  ? Collection Time: 08/11/21  1:25 AM  ? Specimen: Nasopharyngeal Swab; Nasopharyngeal(NP) swabs in vial transport medium  ?Result Value Ref Range Status  ? SARS Coronavirus 2 by RT PCR NEGATIVE NEGATIVE Final  ?  Comment: (NOTE) ?SARS-CoV-2 target nucleic acids are NOT DETECTED. ? ?The SARS-CoV-2 RNA is generally detectable in upper respiratory ?specimens during the acute phase of infection. The lowest ?concentration of SARS-CoV-2 viral copies this assay can detect is ?138 copies/mL. A negative result does not preclude SARS-Cov-2 ?infection and should not be used  as the sole basis for treatment or ?other patient management decisions. A negative result may occur with  ?improper specimen collection/handling, submission of specimen other ?than nasopharyngeal swab, presence of viral mutation(s) within the ?areas targeted by this assay, and inadequate number of viral ?copies(<138 copies/mL). A negative result must be combined with ?clinical observations, patient history, and epidemiological ?information. The expected result is Negative. ? ?Fact Sheet for Patients:  ?EntrepreneurPulse.com.au ? ?Fact Sheet for Healthcare Providers:  ?IncredibleEmployment.be ? ?This test is no t yet approved or cleared by the [place] FDA and  ?has been authorized for detection and/or diagnosis of SARS-CoV-2 by ?FDA under an Emergency Use Authorization (EUA). This EUA will remain  ?in effect (meaning this test can be used) for the duration of the ?COVID-19 declaration under Section 564(b)(1) of the Act, 21 ?U.S.C.section 360bbb-3(b)(1), unless the authorization is terminated  ?or revoked sooner.  ? ? ?  ? Influenza A by PCR NEGATIVE NEGATIVE Final  ? Influenza B by PCR NEGATIVE NEGATIVE Final  ?   Comment: (NOTE) ?The Xpert Xpress SARS-CoV-2/FLU/RSV plus assay is intended as an aid ?in the diagnosis of influenza from Nasopharyngeal swab specimens and ?should not be used as a sole basis for treatment. Nasal

## 2021-08-14 NOTE — Plan of Care (Signed)
?  Problem: Education: ?Goal: Knowledge of General Education information will improve ?Description: Including pain rating scale, medication(s)/side effects and non-pharmacologic comfort measures ?Outcome: Progressing ?  ?Problem: Health Behavior/Discharge Planning: ?Goal: Ability to manage health-related needs will improve ?Outcome: Progressing ?  ?Problem: Clinical Measurements: ?Goal: Ability to maintain clinical measurements within normal limits will improve ?Outcome: Progressing ?Goal: Will remain free from infection ?Outcome: Progressing ?Goal: Diagnostic test results will improve ?Outcome: Progressing ?Goal: Respiratory complications will improve ?Outcome: Progressing ?Goal: Cardiovascular complication will be avoided ?Outcome: Progressing ?  ?Problem: Nutrition: ?Goal: Adequate nutrition will be maintained ?Outcome: Progressing ?  ?Problem: Coping: ?Goal: Level of anxiety will decrease ?Outcome: Progressing ?  ?Problem: Elimination: ?Goal: Will not experience complications related to bowel motility ?Outcome: Progressing ?Goal: Will not experience complications related to urinary retention ?Outcome: Progressing ?  ?Problem: Pain Managment: ?Goal: General experience of comfort will improve ?Outcome: Progressing ?  ?Problem: Safety: ?Goal: Ability to remain free from injury will improve ?Outcome: Progressing ?  ?Problem: Skin Integrity: ?Goal: Risk for impaired skin integrity will decrease ?Outcome: Progressing ?  ?Problem: Clinical Measurements: ?Goal: Complications related to the disease process, condition or treatment will be avoided or minimized ?Outcome: Progressing ?  ?

## 2021-08-14 NOTE — Progress Notes (Addendum)
? ? Progress Note ? ? Subjective  ?Chief Complaint: Painless hematochezia ? ?Last bowel movement was last night, patient states dark maroon/black, formed stools.  Denies any abdominal pain, vomiting, GERD. ? ? ? Objective  ? ?Vital signs in last 24 hours: ?Temp:  [97.6 ?F (36.4 ?C)-98.6 ?F (37 ?C)] 97.6 ?F (36.4 ?C) (03/17 0900) ?Pulse Rate:  [73-87] 81 (03/17 0900) ?Resp:  [17-19] 19 (03/17 0900) ?BP: (131-142)/(75-84) 142/81 (03/17 0900) ?SpO2:  [97 %-100 %] 99 % (03/17 0900) ?Last BM Date : 08/12/21 ? ?General:   male in no acute distress  ?Heart:  regular rate and rhythm ?Pulm: Clear anteriorly; no wheezing ?Abdomen:  Soft,  nondistended  AB, skin exam normal, Normal bowel sounds.  no  tenderness . Without guarding and Without rebound, without hepatomegaly. ?Extremities:  Without edema. ?Neurologic:  Alert and  oriented x4;  grossly normal neurologically.  ?Skin:   Dry and intact without significant lesions or rashes. ?Psychiatric: Cooperative. Normal mood and affect. ? ?Intake/Output from previous day: ?03/16 0701 - 03/17 0700 ?In: 800 [P.O.:800] ?Out: 1620 [Urine:1620] ?Intake/Output this shift: ?Total I/O ?In: -  ?Out: 280 [Urine:280] ? ?Lab Results: ?Recent Labs  ?  08/13/21 ?0865 08/13/21 ?1503 08/14/21 ?0147  ?WBC 7.2 7.0 5.6  ?HGB 8.3* 8.9* 7.1*  ?HCT 25.5* 27.4* 22.7*  ?PLT 221 260 230  ? ?BMET ?Recent Labs  ?  08/12/21 ?0424 08/12/21 ?2053 08/13/21 ?7846  ?NA 138 137 136  ?K 4.0 3.7 3.6  ?CL 103 101 100  ?CO2 27 25 27   ?GLUCOSE 112* 148* 117*  ?BUN 12 13 14   ?CREATININE 0.96 1.01 0.93  ?CALCIUM 8.6* 9.1 8.6*  ? ?LFT ?Recent Labs  ?  08/12/21 ?2053  ?PROT 6.9  ?ALBUMIN 3.7  ?AST 26  ?ALT 18  ?ALKPHOS 66  ?BILITOT 0.4  ? ?PT/INR ?No results for input(s): LABPROT, INR in the last 72 hours. ? ?Studies/Results: ?No results found. ? ? ? Impression/Plan:  ? ?Recurrent GI bleed with anemia ?Colonoscopy 01/22/2020 diverticulosis otherwise normal ?Patient with multiple admissions for GI bleeds attributed to  diverticulosis discharged 08/12/2021, readmitted same day after rectal bleeding with hypotension possible vasovagal episode. ?Hemoglobin 9.9-8.3, no at 7.1 going to get 1 PRBCS ?Patient with dark BM and more formed, possible evidence of resolving GI bleed and no active bleeding. ?Can advance diet as tolerated, continue to monitor H&H and transfuse greater than 7. ?-Recommend proceeding with CTA IF there are any further episodes of bleeding and IR consultation if positive. ? ?Anemia ?CBC on 08/14/2021   ?WBC 5.6 HGB 7.1 MCV 100.9 Platelets 230 ?Anemia studies on 08/13/2021  ?Iron 41 Ferritin 45 B12 570 ?-Continue to monitor H&H with transfusion as needed to maintain hemoglobin greater than 7. ? ?Type 2 diabetes ?Monitor sugars ?  ?Hypotension ?Possible vasovagal reaction has resolved ? ?No future appointments. ? ? ? LOS: 2 days  ? ?08/16/2021  08/14/2021, 9:33 AM ? ? Attending physician's note  ? ?I have taken a history, reviewed the chart and examined the patient. I performed a substantive portion of this encounter, including complete performance of at least one of the key components, in conjunction with the APP. I agree with the APP's note, impression and recommendations.  ? ?Presentation is consistent with diverticular hemorrhage ?He had an episode of dark maroon bowel movement last night, likely residual blood ? ?Hemoglobin trended down to 7, he is getting 1 unit PRBC ?Ordered IV iron infusion X2 given he had significant blood loss,  ferritin is 45 ? ?Plan for CTA if he has recurrent episode of large-volume hematochezia ? ?Continue supportive care ?Advance diet as tolerated ? ?GI is available if needed, please call with any questions ? ? ?The patient was provided an opportunity to ask questions and all were answered. The patient agreed with the plan and demonstrated an understanding of the instructions. ? ? ?K. Scherry Ran , MD ?[phone]    ? ?

## 2021-08-14 NOTE — Care Management Important Message (Signed)
Important Message ? ?Patient Details  ?Name: Derrick Thornton ?MRN: [id number] ?Date of Birth: 03/30/1946 ? ? ?Medicare Important Message Given:  Yes ? ? ? ? ?Levada Dy  Dave Mergen-Martin ?08/14/2021, 12:59 PM ?

## 2021-08-15 DIAGNOSIS — K5731 Diverticulosis of large intestine without perforation or abscess with bleeding: Secondary | ICD-10-CM

## 2021-08-15 DIAGNOSIS — K922 Gastrointestinal hemorrhage, unspecified: Secondary | ICD-10-CM | POA: Diagnosis not present

## 2021-08-15 LAB — CBC
HCT: 25.8 % — ABNORMAL LOW (ref 39.0–52.0)
Hemoglobin: 8.5 g/dL — ABNORMAL LOW (ref 13.0–17.0)
MCH: 32.4 pg (ref 26.0–34.0)
MCHC: 32.9 g/dL (ref 30.0–36.0)
MCV: 98.5 fL (ref 80.0–100.0)
Platelets: 249 10*3/uL (ref 150–400)
RBC: 2.62 MIL/uL — ABNORMAL LOW (ref 4.22–5.81)
RDW: 14 % (ref 11.5–15.5)
WBC: 7.3 10*3/uL (ref 4.0–10.5)
nRBC: 0 % (ref 0.0–0.2)

## 2021-08-15 LAB — BPAM RBC
Blood Product Expiration Date: 202303252359
ISSUE DATE / TIME: 202303170924
Unit Type and Rh: 6200

## 2021-08-15 LAB — TYPE AND SCREEN
ABO/RH(D): A POS
Antibody Screen: NEGATIVE
Unit division: 0

## 2021-08-15 LAB — GLUCOSE, CAPILLARY
Glucose-Capillary: 106 mg/dL — ABNORMAL HIGH (ref 70–99)
Glucose-Capillary: 162 mg/dL — ABNORMAL HIGH (ref 70–99)
Glucose-Capillary: 112 mg/dL — ABNORMAL HIGH (ref 70–99)
Glucose-Capillary: 136 mg/dL — ABNORMAL HIGH (ref 70–99)

## 2021-08-15 MED ORDER — POLYETHYLENE GLYCOL 3350 17 G PO PACK
17.0000 g | PACK | Freq: Every day | ORAL | Status: DC
  Administered 2021-08-15 – 2021-08-16 (×2): 17 g via ORAL
  Filled 2021-08-15 (×2): qty 1

## 2021-08-15 NOTE — Progress Notes (Signed)
[facility] ROUNDING NOTE ? ? ?Subjective: ?No further bleeding.  He had a normal bowel movement ?Hemoglobin remained stable ? ?Objective: ?Vital signs in last 24 hours: ?Temp:  [97.7 ?F (36.5 ?C)-98.7 ?F (37.1 ?C)] 97.7 ?F (36.5 ?C) (03/18 0865) ?Pulse Rate:  [59-80] 77 (03/18 0821) ?Resp:  [17-19] 19 (03/18 7846) ?BP: (100-142)/(66-84) 142/84 (03/18 9629) ?SpO2:  [94 %-100 %] 97 % (03/18 0821) ?Last BM Date : 08/13/21 (per pt) ?General: NAD ? ? ? ?Intake/Output from previous day: ?03/17 0701 - 03/18 0700 ?In: 1600.4 [P.O.:920; I.V.:10; Blood:409.7; IV Piggyback:260.7] ?Out: 2855 [Urine:2855] ?Intake/Output this shift: ?Total I/O ?In: 240 [P.O.:240] ?Out: 200 [Urine:200] ? ? ?Lab Results: ?Recent Labs  ?  08/13/21 ?1503 08/14/21 ?0147 08/14/21 ?1606 08/15/21 ?0127  ?WBC 7.0 5.6  --  7.3  ?HGB 8.9* 7.1* 8.3* 8.5*  ?PLT 260 230  --  249  ?MCV 101.1* 100.9*  --  98.5  ? ?BMET ?Recent Labs  ?  08/12/21 ?2053 08/13/21 ?5284  ?NA 137 136  ?K 3.7 3.6  ?CL 101 100  ?CO2 25 27  ?GLUCOSE 148* 117*  ?BUN 13 14  ?CREATININE 1.01 0.93  ?CALCIUM 9.1 8.6*  ? ?LFT ?Recent Labs  ?  08/12/21 ?2053  ?PROT 6.9  ?ALBUMIN 3.7  ?AST 26  ?ALT 18  ?ALKPHOS 66  ?BILITOT 0.4  ? ?PT/INR ?No results for input(s): INR in the last 72 hours. ? ? ? ?Imaging/Other results: ?No results found. ? ? ? ?Assessment &Plan ? ?76 year old male with painless hematochezia consistent with diverticular hemorrhage ?No bleeding in the past 48 hours ?Continue to advance diet as tolerated ? ?Receiving IV iron infusion given he had significant blood loss ? ?Repeat CBC in 1 to 2 weeks can be done by PCP ?Follow-up with GI as needed ? ?We will sign off but available if have any questions ? ? ? ?K. Scherry Ran , MD ?[phone]  ?[facility] ? ? ?

## 2021-08-15 NOTE — Progress Notes (Signed)
?      ?                 PROGRESS NOTE ? ?      ?PATIENT DETAILS ?Name: Derrick Thornton ?Age: 76 y.o. ?Sex: male ?Date of Birth: April 02, 1946 ?Admit Date: 08/12/2021 ?Admitting Physician Hillary Bow, DO ?[id number], Channing Mutters, MD ? ?Brief Summary: ?76 year old with a history of BPH, DM-2, prior diverticular bleeding-presented with painless hematochezia-thought to have recurrent lower GI bleeding due to diverticulosis. ? ?Significant events: ?3/13-3/15>> hospitalization for diverticular bleeding-managed with supportive care.  Discharged on 3/15. ?3/15>> presented back to the ED in a few hours postdischarge with hematochezia x1 at home-briefly hypotensive/syncope with another episode of hematochezia in the ED. ? ?Significant studies: ?3/13>> CT angio abdomen: No active bleeding.  Extensive colonic diverticulosis.  Possible acute diverticulitis. ? ?Significant microbiology data: ?3/14>> COVID/influenza PCR: Negative ? ?Procedures: ?None ? ?Consults: ?GI ? ?Subjective: ?Did not have a BM yesterday-Per nursing staff-he had a BM earlier today that was somewhat dark but no fresh blood.  Patient is hesitant about going home-as he just got rehospitalized in a few hours-when he was discharged last time. ? ?Objective: ?Vitals: ?Blood pressure (!) 142/84, pulse 77, temperature 97.7 ?F (36.5 ?C), resp. rate 19, height 5\' 9"  (1.753 m), weight 76.1 kg, SpO2 97 %.  ? ?Exam: ?Gen Exam:Alert awake-not in any distress ?HEENT:atraumatic, normocephalic ?Chest: B/L clear to auscultation anteriorly ?CVS:S1S2 regular ?Abdomen:soft non tender, non distended ?Extremities:no edema ?Neurology: Non focal ?Skin: no rash  ? ?Pertinent Labs/Radiology: ?CBC Latest Ref Rng & Units 08/15/2021 08/14/2021 08/14/2021  ?WBC 4.0 - 10.5 K/uL 7.3 - 5.6  ?Hemoglobin 13.0 - 17.0 g/dL 08/16/2021) 8.3(L) 7.1(L)  ?Hematocrit 39.0 - 52.0 % 25.8(L) - 22.7(L)  ?Platelets 150 - 400 K/uL 249 - 230  ?  ?Lab Results  ?Component Value Date  ? NA 136 08/13/2021  ? K 3.6 08/13/2021   ? CL 100 08/13/2021  ? CO2 27 08/13/2021  ?  ? ? ?Assessment/Plan: ?Recurrent lower GI bleeding with acute blood loss anemia: Bleeding seems to have stopped-hemoglobin stable-did require 1 unit of PRBC transfusion on 3/17.  Given the fact that he was rehospitalized within a few hours of discharge-reasonable to observe him for 1 more day before considering discharge tomorrow.   ? ?Transient hypotension/syncope: Due to blood loss/GI bleeding-resolved-given transient nature-and clear-cut etiology-doubt any further work-up required. ?  ?DM-2: CBGs stable with SSI-resume metformin and Amaryl on discharge. ? ?Recent Labs  ?  08/14/21 ?2044 08/15/21 ?0820 08/15/21 ?1140  ?GLUCAP 127* 106* 162*  ? ?  ? ?HLD: Continue statin ?  ?BPH: Continue Flomax ?  ?BMI: ?Estimated body mass index is 24.78 kg/m? as calculated from the following: ?  Height as of this encounter: 5\' 9"  (1.753 m). ?  Weight as of this encounter: 76.1 kg.  ? ?Code status: ?  Code Status: Full Code  ? ?DVT Prophylaxis: ?SCDs Start: 08/12/21 2232 ?  ?Family Communication: None at bedside ? ? ?Disposition Plan: ?Status is: Inpatient ?Remains inpatient appropriate because: Recurrent GI bleeding-blood loss anemia-need to monitor for another 24 to 48 hours before consideration of discharge-especially given that this is his second hospitalization. ?  ?Planned Discharge Destination:Home ? ? ?Diet: ?Diet Order   ? ?       ?  DIET SOFT Room service appropriate? Yes; Fluid consistency: Thin  Diet effective now       ?  ? ?  ?  ? ?  ?  ? ? ?  Antimicrobial agents: ?Anti-infectives (From admission, onward)  ? ? None  ? ?  ? ? ? ?MEDICATIONS: ?Scheduled Meds: ? insulin aspart  0-15 Units Subcutaneous TID WC  ? pantoprazole  80 mg Oral Daily  ? polyethylene glycol  17 g Oral Daily  ? rosuvastatin  10 mg Oral QHS  ? tamsulosin  0.4 mg Oral Daily  ? ?Continuous Infusions: ? lactated ringers 10 mL/hr at 08/13/21 1404  ? ?PRN Meds:.acetaminophen **OR** acetaminophen, ondansetron  **OR** ondansetron (ZOFRAN) IV ? ? ?I have personally reviewed following labs and imaging studies ? ?LABORATORY DATA: ?CBC: ?Recent Labs  ?Lab 08/10/21 ?2047 08/10/21 ?2143 08/12/21 ?2053 08/13/21 ?[id number] 08/13/21 ?1503 08/14/21 ?0147 08/14/21 ?1606 08/15/21 ?0127  ?WBC 4.7   < > 8.6 7.2 7.0 5.6  --  7.3  ?NEUTROABS 1.7  --   --   --   --   --   --   --   ?HGB 11.8*   < > 9.9* 8.3* 8.9* 7.1* 8.3* 8.5*  ?HCT 36.7*   < > 32.1* 25.5* 27.4* 22.7*  --  25.8*  ?MCV 102.8*   < > 103.9* 99.6 101.1* 100.9*  --  98.5  ?PLT 254   < > 283 221 260 230  --  249  ? < > = values in this interval not displayed.  ? ? ? ?Basic Metabolic Panel: ?Recent Labs  ?Lab 08/10/21 ?2047 08/10/21 ?2143 08/12/21 ?0424 08/12/21 ?2053 08/13/21 ?[id number]  ?NA 138 139 138 137 136  ?K 3.6 3.5 4.0 3.7 3.6  ?CL 102 103 103 101 100  ?CO2 22  --  27 25 27   ?GLUCOSE 188* 185* 112* 148* 117*  ?BUN 11 13 12 13 14   ?CREATININE 1.21 1.10 0.96 1.01 0.93  ?CALCIUM 8.9  --  8.6* 9.1 8.6*  ? ? ? ?GFR: ?Estimated Creatinine Clearance: 70.7 mL/min (by C-G formula based on SCr of 0.93 mg/dL). ? ?Liver Function Tests: ?Recent Labs  ?Lab 08/10/21 ?2047 08/12/21 ?2053  ?AST 26 26  ?ALT 17 18  ?ALKPHOS 64 66  ?BILITOT 0.7 0.4  ?PROT 6.8 6.9  ?ALBUMIN 3.6 3.7  ? ? ?No results for input(s): LIPASE, AMYLASE in the last 168 hours. ?No results for input(s): AMMONIA in the last 168 hours. ? ?Coagulation Profile: ?Recent Labs  ?Lab 08/10/21 ?2047  ?INR 1.0  ? ? ? ?Cardiac Enzymes: ?No results for input(s): CKTOTAL, CKMB, CKMBINDEX, TROPONINI in the last 168 hours. ? ?BNP (last 3 results) ?No results for input(s): PROBNP in the last 8760 hours. ? ?Lipid Profile: ?No results for input(s): CHOL, HDL, LDLCALC, TRIG, CHOLHDL, LDLDIRECT in the last 72 hours. ? ?Thyroid Function Tests: ?No results for input(s): TSH, T4TOTAL, FREET4, T3FREE, THYROIDAB in the last 72 hours. ? ?Anemia Panel: ?Recent Labs  ?  08/13/21 ?[id number]  ?VITAMINB12 570  ?FOLATE 17.7  ?FERRITIN 45  ?TIBC 309  ?IRON 41*   ?RETICCTPCT 2.3  ? ? ? ?Urine analysis: ?No results found for: COLORURINE, APPEARANCEUR, LABSPEC, PHURINE, GLUCOSEU, HGBUR, BILIRUBINUR, KETONESUR, PROTEINUR, UROBILINOGEN, NITRITE, LEUKOCYTESUR ? ?Sepsis Labs: ?Lactic Acid, Venous ?No results found for: LATICACIDVEN ? ?MICROBIOLOGY: ?Recent Results (from the past 240 hour(s))  ?Resp Panel by RT-PCR (Flu A&B, Covid) Nasopharyngeal Swab     Status: None  ? Collection Time: 08/11/21  1:25 AM  ? Specimen: Nasopharyngeal Swab; Nasopharyngeal(NP) swabs in vial transport medium  ?Result Value Ref Range Status  ? SARS Coronavirus 2 by RT PCR NEGATIVE NEGATIVE Final  ?  Comment: (NOTE) ?SARS-CoV-2  target nucleic acids are NOT DETECTED. ? ?The SARS-CoV-2 RNA is generally detectable in upper respiratory ?specimens during the acute phase of infection. The lowest ?concentration of SARS-CoV-2 viral copies this assay can detect is ?138 copies/mL. A negative result does not preclude SARS-Cov-2 ?infection and should not be used as the sole basis for treatment or ?other patient management decisions. A negative result may occur with  ?improper specimen collection/handling, submission of specimen other ?than nasopharyngeal swab, presence of viral mutation(s) within the ?areas targeted by this assay, and inadequate number of viral ?copies(<138 copies/mL). A negative result must be combined with ?clinical observations, patient history, and epidemiological ?information. The expected result is Negative. ? ?Fact Sheet for Patients:  ?BloggerCourse.com ? ?Fact Sheet for Healthcare Providers:  ?SeriousBroker.it ? ?This test is no t yet approved or cleared by the [place] FDA and  ?has been authorized for detection and/or diagnosis of SARS-CoV-2 by ?FDA under an Emergency Use Authorization (EUA). This EUA will remain  ?in effect (meaning this test can be used) for the duration of the ?COVID-19 declaration under Section 564(b)(1) of the  Act, 21 ?U.S.C.section 360bbb-3(b)(1), unless the authorization is terminated  ?or revoked sooner.  ? ? ?  ? Influenza A by PCR NEGATIVE NEGATIVE Final  ? Influenza B by PCR NEGATIVE NEGATIVE Final  ?  Comm

## 2021-08-15 NOTE — Plan of Care (Signed)

## 2021-08-16 DIAGNOSIS — K922 Gastrointestinal hemorrhage, unspecified: Secondary | ICD-10-CM | POA: Diagnosis not present

## 2021-08-16 DIAGNOSIS — E119 Type 2 diabetes mellitus without complications: Secondary | ICD-10-CM | POA: Diagnosis not present

## 2021-08-16 LAB — CBC
HCT: 25.5 % — ABNORMAL LOW (ref 39.0–52.0)
Hemoglobin: 8.5 g/dL — ABNORMAL LOW (ref 13.0–17.0)
MCH: 32.9 pg (ref 26.0–34.0)
MCHC: 33.3 g/dL (ref 30.0–36.0)
MCV: 98.8 fL (ref 80.0–100.0)
Platelets: 267 10*3/uL (ref 150–400)
RBC: 2.58 MIL/uL — ABNORMAL LOW (ref 4.22–5.81)
RDW: 13.9 % (ref 11.5–15.5)
WBC: 7.5 10*3/uL (ref 4.0–10.5)
nRBC: 0.3 % — ABNORMAL HIGH (ref 0.0–0.2)

## 2021-08-16 LAB — GLUCOSE, CAPILLARY: Glucose-Capillary: 111 mg/dL — ABNORMAL HIGH (ref 70–99)

## 2021-08-16 NOTE — Progress Notes (Signed)
Discharge instructions (including medications) discussed with and copy provided to patient/caregiver. All belongings sent with patient. 

## 2021-08-16 NOTE — Discharge Summary (Addendum)
? ?PATIENT DETAILS ?Name: Derrick Thornton ?Age: 76 y.o. ?Sex: male ?Date of Birth: Sep 25, 1945 ?MRN: [id number]. ?Admitting Physician: Etta Quill, DO ?[id number], Carloyn Manner, MD ? ?Admit Date: 08/12/2021 ?Discharge date: 08/16/2021 ? ?Recommendations for Outpatient Follow-up:  ?Follow up with PCP in 1-2 weeks ?Please obtain CMP/CBC in one week ? ? ?Admitted From:  ?Home ? ?Disposition: ?Home ?  ?Discharge Condition: ?good ? ?CODE STATUS: ?  Code Status: Full Code  ? ?Diet recommendation:  ?Diet Order   ? ?       ?  Diet - low sodium heart healthy       ?  ?  Diet Carb Modified       ?  ?  DIET SOFT Room service appropriate? Yes; Fluid consistency: Thin  Diet effective now       ?  ? ?  ?  ? ?  ?  ? ?Brief Summary: ?76 year old with a history of BPH, DM-2, prior diverticular bleeding-presented with painless hematochezia-thought to have recurrent lower GI bleeding due to diverticulosis. ?  ?Significant events: ?3/13-3/15>> hospitalization for diverticular bleeding-managed with supportive care.  Discharged on 3/15. ?3/15>> presented back to the ED in a few hours postdischarge with hematochezia x1 at home-briefly hypotensive/syncope with another episode of hematochezia in the ED. ?  ?Significant studies: ?3/13>> CT angio abdomen: No active bleeding.  Extensive colonic diverticulosis.  Possible acute diverticulitis. ?  ?Significant microbiology data: ?3/14>> COVID/influenza PCR: Negative ?  ?Procedures: ?None ?  ?Consults: ?GI ? ?Brief Hospital Course: ?Recurrent lower GI bleeding with acute blood loss anemia: Bleeding resolved with just supportive care, did stable-did require 1 unit of PRBC transfusion on 3/17. Given IV Iron by GI. He had brown stools on 3/18-CBC on 3/19 w stable Hb. PCP to recheck CBC in a week ?  ?Transient hypotension/syncope: Due to blood loss/GI bleeding-resolved-given transient nature-and clear-cut etiology-doubt any further work-up required. ?  ?DM-2: CBGs stable with SSI-resume metformin and Amaryl  on discharge. ? ?HLD: Continue statin ?  ?BPH: Continue Flomax ?  ?BMI: ?Estimated body mass index is 24.78 kg/m? as calculated from the following: ?  Height as of this encounter: 5\' 9"  (1.753 m). ?  Weight as of this encounter: 76.1 kg.  ?  ?Discharge Diagnoses:  ?Principal Problem: ?  Acute GI bleeding ?Active Problems: ?  Type 2 diabetes mellitus ([place]) ?  Diverticular hemorrhage ? ? ?Discharge Instructions: ? ?Activity:  ?As tolerated  ? ?Discharge Instructions   ? ? Call MD for:   Complete by: As directed ?  ? Recurrent bright red blood per rectum  ? Diet - low sodium heart healthy   Complete by: As directed ?  ? Diet Carb Modified   Complete by: As directed ?  ? Discharge instructions   Complete by: As directed ?  ? Follow with Primary MD  [name], MD in 1-2 weeks ? ?Please get a complete blood count and chemistry panel checked by your Primary MD at your next visit, and again as instructed by your Primary MD. ? ?Get Medicines reviewed and adjusted: ?Please take all your medications with you for your next visit with your Primary MD ? ?Laboratory/radiological data: ?Please request your Primary MD to go over all hospital tests and procedure/radiological results at the follow up, please ask your Primary MD to get all Hospital records sent to his/her office. ? ?In some cases, they will be blood work, cultures and biopsy results pending at the time of your discharge. Please request  that your primary care M.D. follows up on these results. ? ?Also Note the following: ?If you experience worsening of your admission symptoms, develop shortness of breath, life threatening emergency, suicidal or homicidal thoughts you must seek medical attention immediately by calling 911 or calling your MD immediately  if symptoms less severe. ? ?You must read complete instructions/literature along with all the possible adverse reactions/side effects for all the Medicines you take and that have been prescribed to you. Take any new  Medicines after you have completely understood and accpet all the possible adverse reactions/side effects.  ? ?Do not drive when taking Pain medications or sleeping medications (Benzodaizepines) ? ?Do not take more than prescribed Pain, Sleep and Anxiety Medications. It is not advisable to combine anxiety,sleep and pain medications without talking with your primary care practitioner ? ?Special Instructions: If you have smoked or chewed Tobacco  in the last 2 yrs please stop smoking, stop any regular Alcohol  and or any Recreational drug use. ? ?Wear Seat belts while driving. ? ?Please note: ?You were cared for by a hospitalist during your hospital stay. Once you are discharged, your primary care physician will handle any further medical issues. Please note that NO REFILLS for any discharge medications will be authorized once you are discharged, as it is imperative that you return to your primary care physician (or establish a relationship with a primary care physician if you do not have one) for your post hospital discharge needs so that they can reassess your need for medications and monitor your lab values.  ? Increase activity slowly   Complete by: As directed ?  ? ?  ? ?Allergies as of 08/16/2021   ? ?   Reactions  ? Tuberculin, Ppd   ? ?  ? ?  ?Medication List  ?  ? ?STOP taking these medications   ? ?ibuprofen 800 MG tablet ?Commonly known as: ADVIL ?  ? ?  ? ?TAKE these medications   ? ?folic acid A999333 MCG tablet ?Commonly known as: FOLVITE ?Take 400 mcg by mouth 3 (three) times a week. ?  ?glimepiride 4 MG tablet ?Commonly known as: AMARYL ?Take 4 mg by mouth daily as needed. If CBG  is > 150 ?  ?metFORMIN 500 MG tablet ?Commonly known as: GLUCOPHAGE ?Take 500 mg by mouth at bedtime. ?  ?omeprazole 40 MG capsule ?Commonly known as: PRILOSEC ?Take 1 capsule (40 mg total) by mouth daily. ?  ?OVER THE COUNTER MEDICATION ?Take 1 tablet by mouth every other day. Ashw[place]right Mood Plus ?  ?OVER THE COUNTER  MEDICATION ?Take 1 capsule by mouth every other day. BEET POWDER ?  ?rosuvastatin 10 MG tablet ?Commonly known as: CRESTOR ?Take 10 mg by mouth at bedtime. ?  ?tadalafil 5 MG tablet ?Commonly known as: CIALIS ?Take 5 mg by mouth 2 (two) times a week. Monday & Friday ?  ?tamsulosin 0.4 MG Caps capsule ?Commonly known as: FLOMAX ?Take 0.4 mg by mouth daily. ?  ?vitamin C with rose hips 500 MG tablet ?Take 500 mg by mouth 3 (three) times a week. No set days ?  ?Zinc 50 MG Tabs ?Take 50 mg by mouth 2 (two) times a week. No set days ?  ? ?  ? ? Follow-up Information   ? ? [name], MD. Schedule an appointment as soon as possible for a visit in 1 week(s).   ?Specialty: Internal Medicine ?Contact information: ?[address] ?[place] [address] ?[phone] ? ? ?  ?  ? ?  ?  ? ?  ? ?  Allergies  ?Allergen Reactions  ? Tuberculin, Ppd   ? ? ? ?Other Procedures/Studies: ?CT Angio Abd/Pel W and/or Wo Contrast ? ?Result Date: 08/11/2021 ?CLINICAL DATA:  Lower GI bleed, rectal bleeding. EXAM: CTA ABDOMEN AND PELVIS WITHOUT AND WITH CONTRAST TECHNIQUE: Multidetector CT imaging of the abdomen and pelvis was performed using the standard protocol during bolus administration of intravenous contrast. Multiplanar reconstructed images and MIPs were obtained and reviewed to evaluate the vascular anatomy. RADIATION DOSE REDUCTION: This exam was performed according to the departmental dose-optimization program which includes automated exposure control, adjustment of the mA and/or kV according to patient size and/or use of iterative reconstruction technique. CONTRAST:  165mL OMNIPAQUE IOHEXOL 350 MG/ML SOLN COMPARISON:  01/22/2020. FINDINGS: VASCULAR Aorta: Mild atherosclerotic calcification. Normal caliber aorta without aneurysm, dissection, vasculitis or significant stenosis. Celiac: Patent without evidence of aneurysm, dissection, vasculitis or significant stenosis. SMA: Patent without evidence of aneurysm, dissection, vasculitis  or significant stenosis. Renals: Both renal arteries are patent without evidence of aneurysm, dissection, vasculitis, fibromuscular dysplasia or significant stenosis. IMA: Patent without evidence of aneurysm, dissec

## 2022-10-24 IMAGING — CT CT CTA ABD/PEL W/CM AND/OR W/O CM
3 of 10 series · 11 of 46 positions shown, 17 images · IV contrast (Omni 300)
Comparison: 01/22/2020.

CLINICAL DATA: Lower GI bleed, rectal bleeding.

EXAM:
CTA ABDOMEN AND PELVIS WITHOUT AND WITH CONTRAST
TECHNIQUE: Multidetector CT imaging of the abdomen and pelvis was performed
using the standard protocol during bolus administration of
intravenous contrast. Multiplanar reconstructed images and MIPs were
obtained and reviewed to evaluate the vascular anatomy.

[Series 6: arterial 3.0 · axial · arterial · 0.93mm/px · z∈[+834,+930]mm · 3 of 140 slices shown]
[im 11/140  soft-tissue]
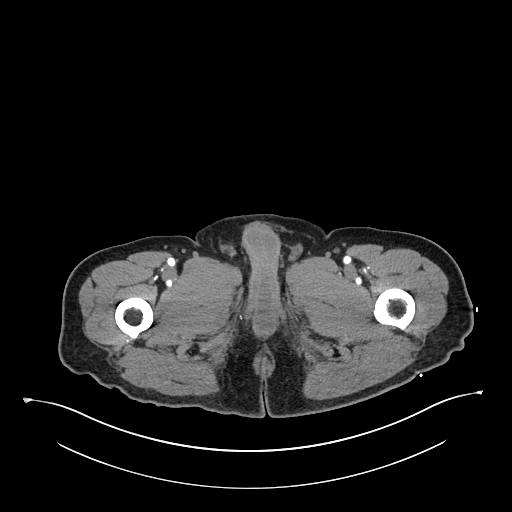
[im 33/140  soft-tissue]
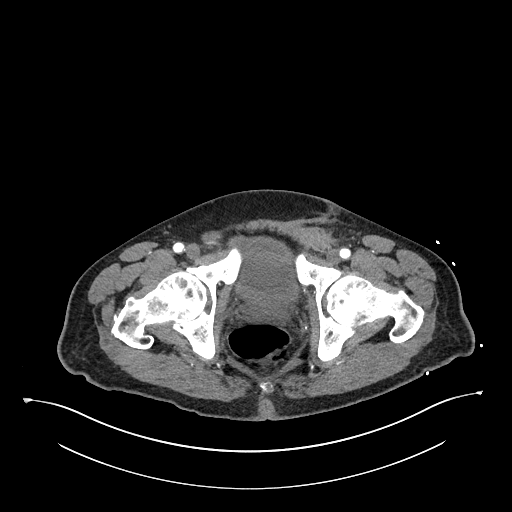
[im 43/140  soft-tissue]
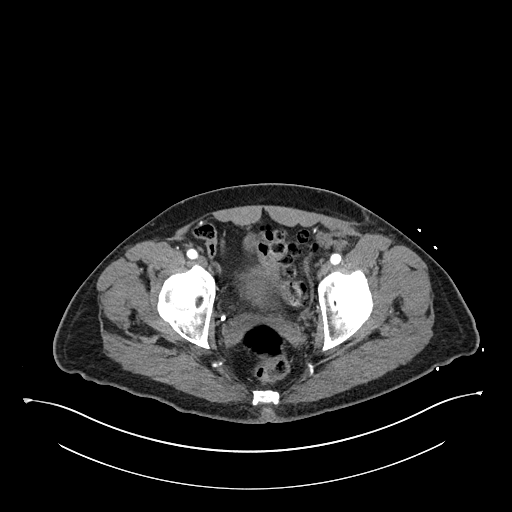

[Series 8: portal venous · axial · portal-venous · 0.93mm/px · z∈[+861,+1161]mm · 6 of 84 slices shown, 11 images]
[im 12/84  soft-tissue]
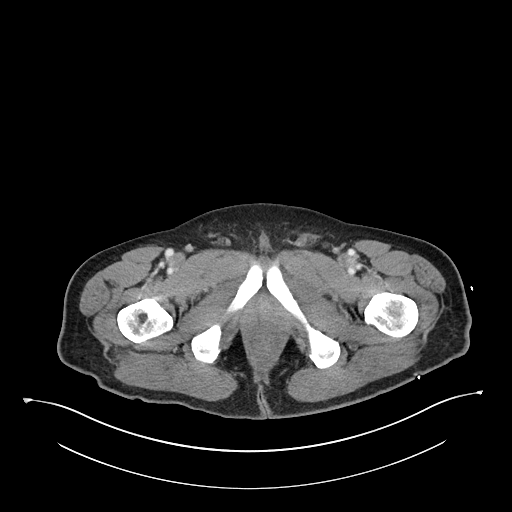
[im 12/84  bone]
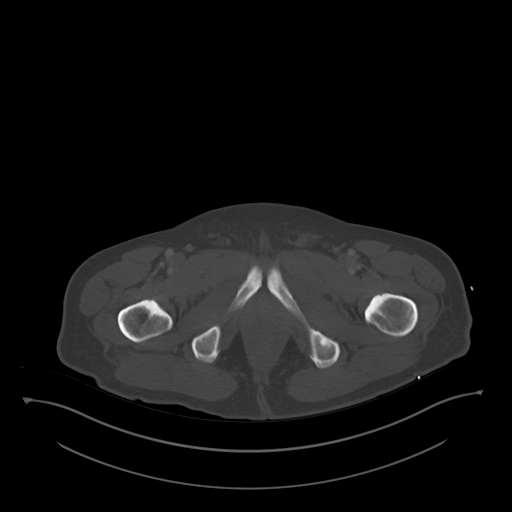
[im 24/84  soft-tissue]
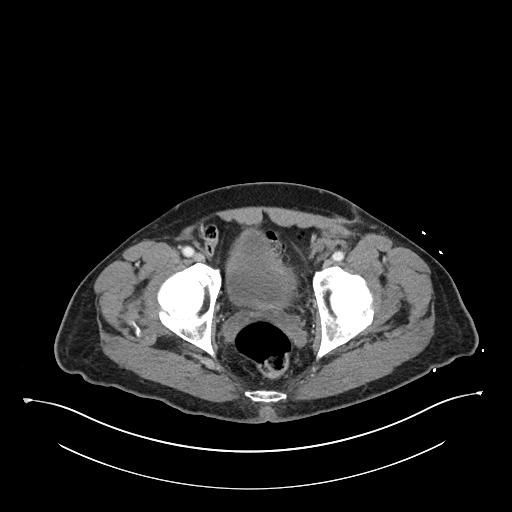
[im 36/84  soft-tissue]
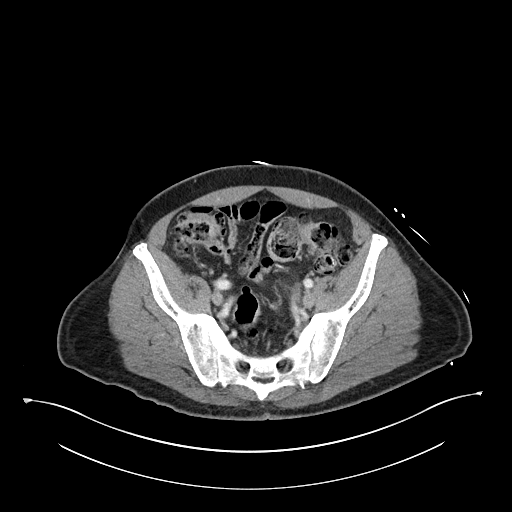
[im 36/84  lung]
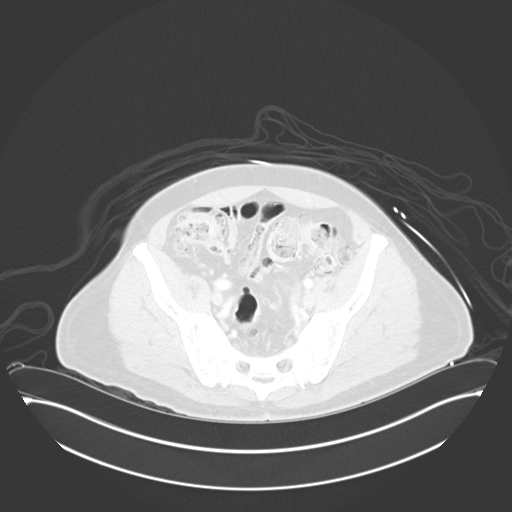
[im 48/84  soft-tissue]
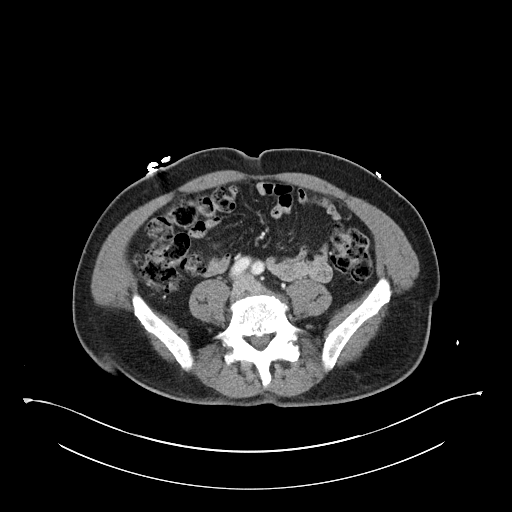
[im 48/84  lung]
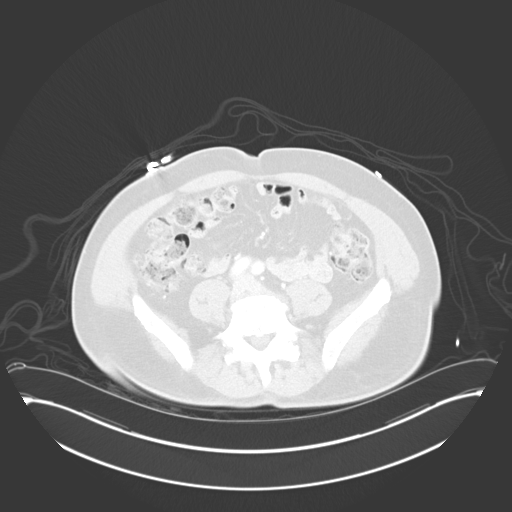
[im 60/84  soft-tissue]
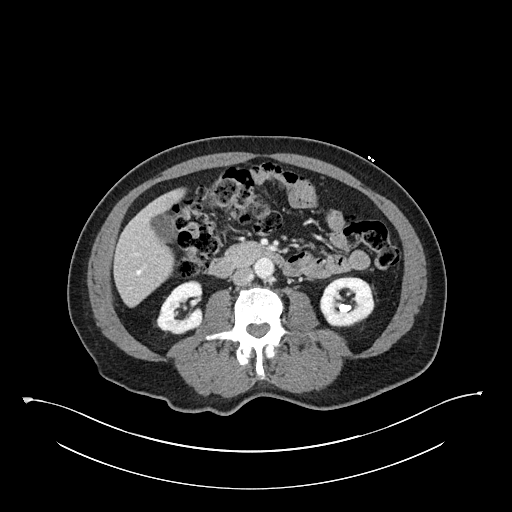
[im 60/84  lung]
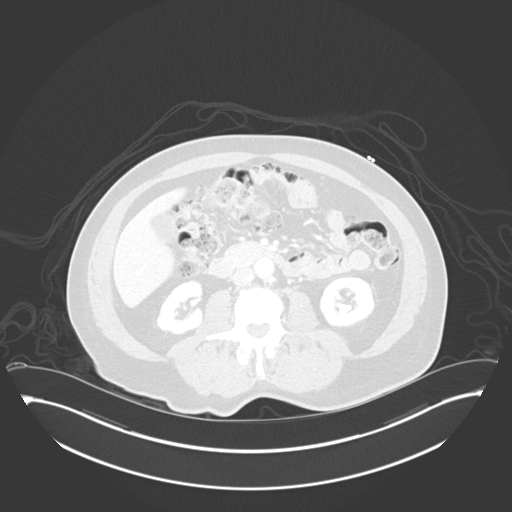
[im 72/84  soft-tissue]
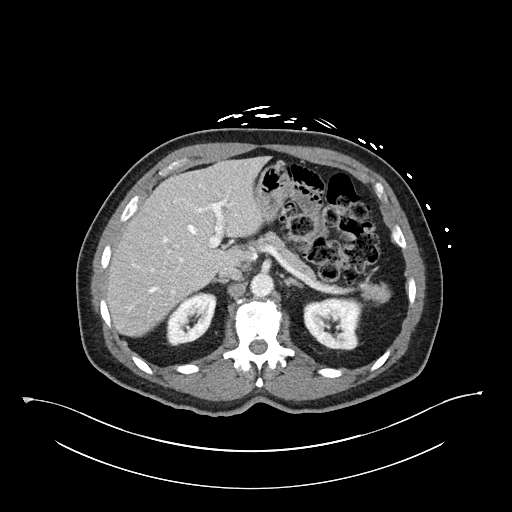
[im 72/84  lung]
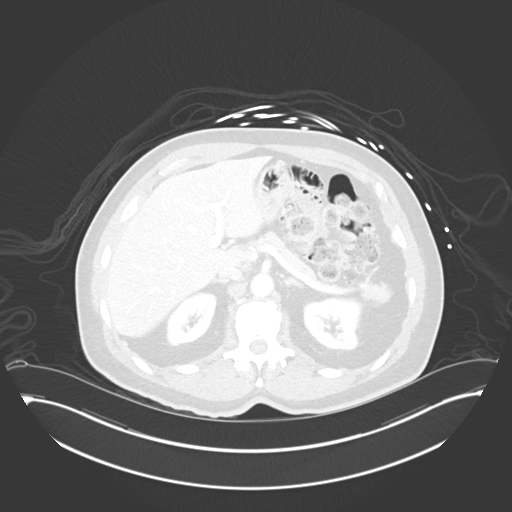

[Series 11: arterial cor · coronal · arterial · 0.78mm/px · 2 of 133 slices shown, 3 images]
[im 45/133  soft-tissue]
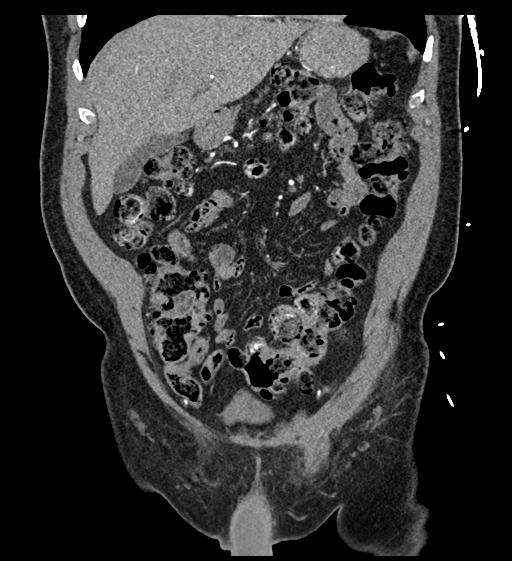
[im 45/133  bone]
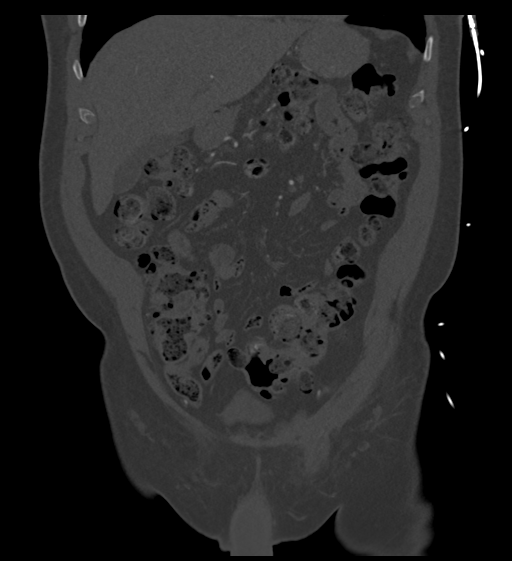
[im 89/133  soft-tissue]
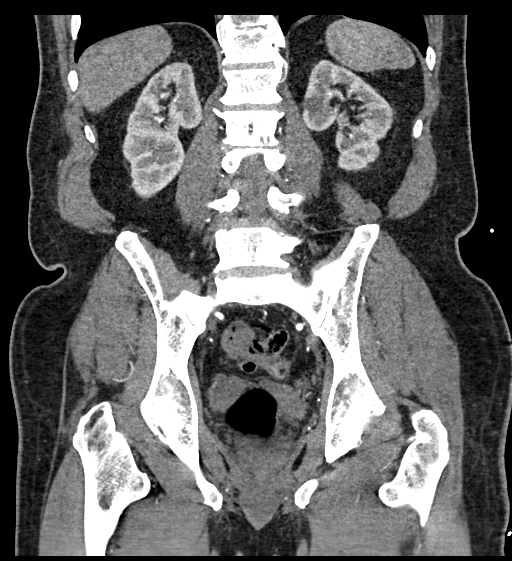

[11 of 46 positions shown; findings below may reference images not displayed]

RADIATION DOSE REDUCTION: This exam was performed according to the
departmental dose-optimization program which includes automated
exposure control, adjustment of the mA and/or kV according to
patient size and/or use of iterative reconstruction technique.

CONTRAST:  100mL OMNIPAQUE IOHEXOL 350 MG/ML SOLN
FINDINGS: VASCULAR

Aorta: Mild atherosclerotic calcification. Normal caliber aorta
without aneurysm, dissection, vasculitis or significant stenosis.

Celiac: Patent without evidence of aneurysm, dissection, vasculitis
or significant stenosis.

SMA: Patent without evidence of aneurysm, dissection, vasculitis or
significant stenosis.

Renals: Both renal arteries are patent without evidence of aneurysm,
dissection, vasculitis, fibromuscular dysplasia or significant
stenosis.

IMA: Patent without evidence of aneurysm, dissection, vasculitis or
significant stenosis.

Inflow: Patent without evidence of aneurysm, dissection, vasculitis
or significant stenosis.

Proximal Outflow: Bilateral common femoral and visualized portions
of the superficial and profunda femoral arteries are patent without
evidence of aneurysm, dissection, vasculitis or significant
stenosis.

Veins: No obvious venous abnormality within the limitations of this
arterial phase study.

Review of the MIP images confirms the above findings.

NON-VASCULAR

Lower chest: Mild atelectasis or scarring at the right lung base.

Hepatobiliary: No focal liver abnormality is seen. No gallstones,
gallbladder wall thickening, or biliary dilatation.

Pancreas: Unremarkable. No pancreatic ductal dilatation or
surrounding inflammatory changes.

Spleen: Normal in size without focal abnormality.

Adrenals/Urinary Tract: No adrenal nodule or mass. Multiple cysts
are present in the left kidney. Subcentimeter hypodensities are
noted bilaterally which are too small to further characterize. A
nonobstructive calculus is present in the lower pole the left
kidney. No hydronephrosis. The bladder is within normal limits.

Stomach/Bowel: The stomach is within normal limits. No bowel
obstruction, free air, or pneumatosis. A moderate amount of retained
stool is present in the colon. Multiple scattered diverticula are
noted along the colon, including giant diverticula at the sigmoid
colon. There is mild bowel wall thickening at the sigmoid colon with
mild surrounding fat stranding suggesting diverticulitis. No abscess
or focal hemorrhage is identified. No contrast extravasation is
seen. The appendix is normal in caliber.

Lymphatic: No abdominal or pelvic lymphadenopathy.

Reproductive: Prostate gland is mildly enlarged.

Other: No free fluid.  A fat containing umbilical hernia is noted.

Musculoskeletal: Degenerative changes in the thoracolumbar spine. No
acute osseous abnormality.
IMPRESSION: VASCULAR

1. No evidence of acute hemorrhage, contrast extravasation, or
significant arterial stenosis.
2. Mild aortic atherosclerosis.

NON-VASCULAR

1. Extensive colonic diverticulosis. There is colonic wall
thickening with surrounding fat stranding and multiple giant
diverticula along the sigmoid colon, suggesting acute
diverticulitis. No abscess or free air is seen.
2. Left renal cysts and nonobstructive calculus.
3. Enlarged prostate gland.

## 2023-11-04 ENCOUNTER — Observation Stay (HOSPITAL_COMMUNITY)

## 2023-11-04 ENCOUNTER — Observation Stay (HOSPITAL_COMMUNITY)
Admission: EM | Admit: 2023-11-04 | Discharge: 2023-11-06 | Disposition: A | Discharge status: Home / Self Care | Attending: Emergency Medicine | Admitting: Emergency Medicine

## 2023-11-04 ENCOUNTER — Encounter (HOSPITAL_COMMUNITY): Payer: Self-pay | Admitting: Internal Medicine

## 2023-11-04 ENCOUNTER — Other Ambulatory Visit: Payer: Self-pay

## 2023-11-04 DIAGNOSIS — D62 Acute posthemorrhagic anemia: Secondary | ICD-10-CM | POA: Diagnosis not present

## 2023-11-04 DIAGNOSIS — K5731 Diverticulosis of large intestine without perforation or abscess with bleeding: Secondary | ICD-10-CM | POA: Diagnosis present

## 2023-11-04 DIAGNOSIS — K922 Gastrointestinal hemorrhage, unspecified: Secondary | ICD-10-CM

## 2023-11-04 DIAGNOSIS — N400 Enlarged prostate without lower urinary tract symptoms: Secondary | ICD-10-CM | POA: Diagnosis not present

## 2023-11-04 DIAGNOSIS — I10 Essential (primary) hypertension: Secondary | ICD-10-CM

## 2023-11-04 DIAGNOSIS — D509 Iron deficiency anemia, unspecified: Secondary | ICD-10-CM | POA: Diagnosis present

## 2023-11-04 DIAGNOSIS — R1030 Lower abdominal pain, unspecified: Secondary | ICD-10-CM | POA: Diagnosis not present

## 2023-11-04 DIAGNOSIS — Z7984 Long term (current) use of oral hypoglycemic drugs: Secondary | ICD-10-CM | POA: Insufficient documentation

## 2023-11-04 DIAGNOSIS — E119 Type 2 diabetes mellitus without complications: Secondary | ICD-10-CM | POA: Diagnosis not present

## 2023-11-04 DIAGNOSIS — D500 Iron deficiency anemia secondary to blood loss (chronic): Secondary | ICD-10-CM

## 2023-11-04 DIAGNOSIS — Z79899 Other long term (current) drug therapy: Secondary | ICD-10-CM | POA: Diagnosis not present

## 2023-11-04 DIAGNOSIS — K625 Hemorrhage of anus and rectum: Secondary | ICD-10-CM | POA: Diagnosis present

## 2023-11-04 LAB — CBC WITH DIFFERENTIAL/PLATELET
Abs Immature Granulocytes: 0.04 10*3/uL (ref 0.00–0.07)
Abs Immature Granulocytes: 0.02 10*3/uL (ref 0.00–0.07)
Basophils Absolute: 0 10*3/uL (ref 0.0–0.1)
Basophils Absolute: 0 10*3/uL (ref 0.0–0.1)
Basophils Relative: 1 %
Basophils Relative: 0 %
Eosinophils Absolute: 0.2 10*3/uL (ref 0.0–0.5)
Eosinophils Absolute: 0.1 10*3/uL (ref 0.0–0.5)
Eosinophils Relative: 4 %
Eosinophils Relative: 2 %
HCT: 34.9 % — ABNORMAL LOW (ref 39.0–52.0)
HCT: 31.3 % — ABNORMAL LOW (ref 39.0–52.0)
Hemoglobin: 11.3 g/dL — ABNORMAL LOW (ref 13.0–17.0)
Hemoglobin: 10.2 g/dL — ABNORMAL LOW (ref 13.0–17.0)
Immature Granulocytes: 1 %
Immature Granulocytes: 0 %
Lymphocytes Relative: 27 %
Lymphocytes Relative: 15 %
Lymphs Abs: 1.5 10*3/uL (ref 0.7–4.0)
Lymphs Abs: 1.2 10*3/uL (ref 0.7–4.0)
MCH: 33.1 pg (ref 26.0–34.0)
MCH: 33 pg (ref 26.0–34.0)
MCHC: 32.4 g/dL (ref 30.0–36.0)
MCHC: 32.6 g/dL (ref 30.0–36.0)
MCV: 102.3 fL — ABNORMAL HIGH (ref 80.0–100.0)
MCV: 101.3 fL — ABNORMAL HIGH (ref 80.0–100.0)
Monocytes Absolute: 0.6 10*3/uL (ref 0.1–1.0)
Monocytes Absolute: 0.5 10*3/uL (ref 0.1–1.0)
Monocytes Relative: 10 %
Monocytes Relative: 7 %
Neutro Abs: 3.4 10*3/uL (ref 1.7–7.7)
Neutro Abs: 5.8 10*3/uL (ref 1.7–7.7)
Neutrophils Relative %: 57 %
Neutrophils Relative %: 76 %
Platelets: 344 10*3/uL (ref 150–400)
Platelets: 306 10*3/uL (ref 150–400)
RBC: 3.41 MIL/uL — ABNORMAL LOW (ref 4.22–5.81)
RBC: 3.09 MIL/uL — ABNORMAL LOW (ref 4.22–5.81)
RDW: 11.7 % (ref 11.5–15.5)
RDW: 11.8 % (ref 11.5–15.5)
WBC: 5.8 10*3/uL (ref 4.0–10.5)
WBC: 7.7 10*3/uL (ref 4.0–10.5)
nRBC: 0 % (ref 0.0–0.2)
nRBC: 0 % (ref 0.0–0.2)

## 2023-11-04 LAB — COMPREHENSIVE METABOLIC PANEL WITH GFR
ALT: 12 U/L (ref 0–44)
ALT: 14 U/L (ref 0–44)
AST: 22 U/L (ref 15–41)
AST: 17 U/L (ref 15–41)
Albumin: 3.7 g/dL (ref 3.5–5.0)
Albumin: 3.3 g/dL — ABNORMAL LOW (ref 3.5–5.0)
Alkaline Phosphatase: 60 U/L (ref 38–126)
Alkaline Phosphatase: 55 U/L (ref 38–126)
Anion gap: 12 (ref 5–15)
Anion gap: 7 (ref 5–15)
BUN: 14 mg/dL (ref 8–23)
BUN: 12 mg/dL (ref 8–23)
CO2: 24 mmol/L (ref 22–32)
CO2: 27 mmol/L (ref 22–32)
Calcium: 9.6 mg/dL (ref 8.9–10.3)
Calcium: 9 mg/dL (ref 8.9–10.3)
Chloride: 103 mmol/L (ref 98–111)
Chloride: 104 mmol/L (ref 98–111)
Creatinine, Ser: 1.04 mg/dL (ref 0.61–1.24)
Creatinine, Ser: 1.02 mg/dL (ref 0.61–1.24)
GFR, Estimated: >60 mL/min (ref >60)
GFR, Estimated: >60 mL/min (ref >60)
Glucose, Bld: 96 mg/dL (ref 70–99)
Glucose, Bld: 101 mg/dL — ABNORMAL HIGH (ref 70–99)
Potassium: 3.8 mmol/L (ref 3.5–5.1)
Potassium: 3.8 mmol/L (ref 3.5–5.1)
Sodium: 139 mmol/L (ref 135–145)
Sodium: 138 mmol/L (ref 135–145)
Total Bilirubin: 0.6 mg/dL (ref 0.0–1.2)
Total Bilirubin: 0.6 mg/dL (ref 0.0–1.2)
Total Protein: 6.7 g/dL (ref 6.5–8.1)
Total Protein: 6.2 g/dL — ABNORMAL LOW (ref 6.5–8.1)

## 2023-11-04 LAB — URINALYSIS, ROUTINE W REFLEX MICROSCOPIC
Bilirubin Urine: NEGATIVE
Glucose, UA: NEGATIVE mg/dL
Hgb urine dipstick: NEGATIVE
Ketones, ur: NEGATIVE mg/dL
Leukocytes,Ua: NEGATIVE
Nitrite: NEGATIVE
Protein, ur: NEGATIVE mg/dL
Specific Gravity, Urine: 1.005 (ref 1.005–1.030)
pH: 5 (ref 5.0–8.0)

## 2023-11-04 LAB — PROTIME-INR
INR: 0.9 (ref 0.8–1.2)
Prothrombin Time: 12.2 s (ref 11.4–15.2)

## 2023-11-04 LAB — MAGNESIUM: Magnesium: 1.7 mg/dL (ref 1.7–2.4)

## 2023-11-04 LAB — SAMPLE TO BLOOD BANK

## 2023-11-04 LAB — HEMOGLOBIN AND HEMATOCRIT, BLOOD
HCT: 29.9 % — ABNORMAL LOW (ref 39.0–52.0)
HCT: 31.4 % — ABNORMAL LOW (ref 39.0–52.0)
Hemoglobin: 9.9 g/dL — ABNORMAL LOW (ref 13.0–17.0)
Hemoglobin: 10.4 g/dL — ABNORMAL LOW (ref 13.0–17.0)

## 2023-11-04 LAB — PHOSPHORUS: Phosphorus: 2.7 mg/dL (ref 2.5–4.6)

## 2023-11-04 MED ORDER — ONDANSETRON HCL 4 MG PO TABS: 4.0000 mg | ORAL_TABLET | Freq: Four times a day (QID) | ORAL | Status: DC | PRN

## 2023-11-04 MED ORDER — SODIUM CHLORIDE 0.9% FLUSH: 3.0000 mL | INTRAVENOUS | Status: DC | PRN

## 2023-11-04 MED ORDER — ACETAMINOPHEN 325 MG PO TABS: 650.0000 mg | ORAL_TABLET | Freq: Four times a day (QID) | ORAL | Status: DC | PRN

## 2023-11-04 MED ORDER — PANTOPRAZOLE SODIUM 40 MG IV SOLR
40.0000 mg | Freq: Two times a day (BID) | INTRAVENOUS | Status: DC
  Administered 2023-11-04 – 2023-11-06 (×5): 40 mg via INTRAVENOUS
  Filled 2023-11-04 (×5): qty 10

## 2023-11-04 MED ORDER — ONDANSETRON HCL 4 MG/2ML IJ SOLN: 4.0000 mg | Freq: Four times a day (QID) | INTRAMUSCULAR | Status: DC | PRN

## 2023-11-04 MED ORDER — SODIUM CHLORIDE 0.9% FLUSH
3.0000 mL | Freq: Two times a day (BID) | INTRAVENOUS | Status: DC
  Administered 2023-11-04 – 2023-11-06 (×5): 10 mL via INTRAVENOUS

## 2023-11-04 MED ORDER — ACETAMINOPHEN 650 MG RE SUPP: 650.0000 mg | Freq: Four times a day (QID) | RECTAL | Status: DC | PRN

## 2023-11-04 MED ORDER — TAMSULOSIN HCL 0.4 MG PO CAPS
0.4000 mg | ORAL_CAPSULE | Freq: Every day | ORAL | Status: DC
  Administered 2023-11-04 – 2023-11-06 (×3): 0.4 mg via ORAL
  Filled 2023-11-04 (×3): qty 1

## 2023-11-04 MED ORDER — MORPHINE SULFATE (PF) 2 MG/ML IV SOLN: 2.0000 mg | INTRAVENOUS | Status: DC | PRN

## 2023-11-04 MED ORDER — ROSUVASTATIN CALCIUM 5 MG PO TABS
10.0000 mg | ORAL_TABLET | Freq: Every day | ORAL | Status: DC
  Filled 2023-11-04 (×2): qty 2

## 2023-11-04 MED ORDER — IOHEXOL 9 MG/ML PO SOLN: 500.0000 mL | ORAL | Status: DC

## 2023-11-04 MED ORDER — TADALAFIL 5 MG PO TABS
5.0000 mg | ORAL_TABLET | ORAL | Status: DC
  Filled 2023-11-04: qty 1

## 2023-11-04 MED ORDER — IOHEXOL 350 MG/ML SOLN
75.0000 mL | Freq: Once | INTRAVENOUS | Status: AC | PRN
  Administered 2023-11-04: 75 mL via INTRAVENOUS

## 2023-11-04 NOTE — Assessment & Plan Note (Addendum)
 11-04-2023 may need iron infusions as outpatient.  11-05-2023 stable.  11-06-2023 stable. Pt has iron pills at home. F/u with PCP for repeat CBC in 2 weeks. Stable for DC.

## 2023-11-04 NOTE — Assessment & Plan Note (Addendum)
 11-04-2023 likely diverticular bleed. Hopefully it has self resolved. Continue with q8h H&H. Transfuse for HgB less than 7.0 g/dl. Pt verbally consents to PRBC Transfusion if needed. He denies being a Jehovah's witness.  11-05-2023 HgB up to 10.2 g/dl.  [place]ad[place] HgB of 9.9.  stop q8h H&H. Repeat CBC in AM. If stable, he can go home tomorrow.  11-06-2023 ready for DC. Bleeding due to diverticular bleed. Pt has iron pills at home. F/u with PCP for repeat CBC in 2 weeks. Stable for DC.

## 2023-11-04 NOTE — Subjective & Objective (Addendum)
 Pt seen and examined. No further bleeding. HgB increased to 10.2. GI says pt can go home today. Pt declines to be discharged today. He states in the past, he has had to return to ER after being discharged 24 hours after bleeding stops. Wants to be monitor overnight and is agreeable to going home tomorrow. Diet advanced to soft diet.

## 2023-11-04 NOTE — Care Management Obs Status (Signed)
 MEDICARE OBSERVATION STATUS NOTIFICATION   Patient Details  Name: CHALMERS IDDINGS MRN: [id number] Date of Birth: 04-09-1946   Medicare Observation Status Notification Given:  Yes    Jonathan Neighbor, RN 11/04/2023, 2:12 PM

## 2023-11-04 NOTE — Assessment & Plan Note (Addendum)
 11-04-2023 pt on chronic Cialis . Do NOT give any form of nitroglycerin!!  11-05-2023 stable.    11-06-2023 stable.

## 2023-11-04 NOTE — H&P (Signed)
 History and Physical    Patient: Derrick Thornton ZOX:[id number] DOB: 11/09/1945 DOA: 11/04/2023 DOS: the patient was seen and examined on 11/04/2023 PCP: Edda Goo, MD  Patient coming from: Home Chief complaint: Chief Complaint  Patient presents with   Rectal Bleeding   HPI:  Derrick Thornton is a 78 y.o. male with past medical history  ofDM2, HTN, prior GIB, Barrett's esophagus,GIB from diverticular origin admitted in march 2023 with similar episode of rectal bleeding.   Patient states that it really has been going on for 4 days this is his fifth time in the past few years that he has had this.  Today was the worse when he filled the commode with red blood.  No reports of alcohol drugs or tobacco or NSAIDs.  ED Course: Pt in ed at bedside  is Alert awake oriented afebrile and stable no distress. Vital signs in the ED were notable for the following:  Vitals:   11/04/23 0043  BP: (!) 161/95  Pulse: 92  Temp: 98.4 F (36.9 C)  Resp: 18  SpO2: 97%   >>ED evaluation thus far shows: Blood work today shows normal CMP, CBC with anemia that is chronic and intermittent hemoglobin today of 11.3 MCV 102.3 WBC of 5.8 normal platelets.  INR 0.9 no history of alcohol drugs or tobacco. Urinalysis today is showing straw-colored urine otherwise normal. Positive fecal occult blood.  >>While in the ED patient received the following: Medications  acetaminophen  (TYLENOL ) tablet 650 mg (has no administration in time range)    Or  acetaminophen  (TYLENOL ) suppository 650 mg (has no administration in time range)  morphine  (PF) 2 MG/ML injection 2 mg (has no administration in time range)  ondansetron  (ZOFRAN ) tablet 4 mg (has no administration in time range)    Or  ondansetron  (ZOFRAN ) injection 4 mg (has no administration in time range)  sodium chloride  flush (NS) 0.9 % injection 3-10 mL (has no administration in time range)  sodium chloride  flush (NS) 0.9 % injection 3-10 mL (has no administration  in time range)  pantoprazole  (PROTONIX ) injection 40 mg (has no administration in time range)  rosuvastatin  (CRESTOR ) tablet 10 mg (has no administration in time range)  tamsulosin  (FLOMAX ) capsule 0.4 mg (has no administration in time range)  tadalafil  (CIALIS ) tablet 5 mg (has no administration in time range)   Review of Systems  Gastrointestinal:  Positive for abdominal pain and blood in stool.   Past Medical History:  Diagnosis Date   Acute GI bleeding 06/15/2019   Anemia    Aortic atherosclerosis (HCC)    Barrett's esophagus    DDD (degenerative disc disease), lumbar    Diabetes mellitus without complication (HCC)    Diverticulitis    Diverticulosis    GI bleed    Hiatal hernia    Hyperplastic colon polyp    Hypertension    Internal hemorrhoids    Nephrolithiasis    Past Surgical History:  Procedure Laterality Date   ACHILLES TENDON REPAIR     BIOPSY  01/22/2020   Procedure: BIOPSY;  Surgeon: Albertina Hugger, MD;  Location: [facility];  Service: Endoscopy;;   COLONOSCOPY WITH PROPOFOL  N/A 01/22/2020   Procedure: COLONOSCOPY WITH PROPOFOL ;  Surgeon: Albertina Hugger, MD;  Location: [facility];  Service: Endoscopy;  Laterality: N/A;   ESOPHAGOGASTRODUODENOSCOPY (EGD) WITH PROPOFOL  N/A 01/22/2020   Procedure: ESOPHAGOGASTRODUODENOSCOPY (EGD) WITH PROPOFOL ;  Surgeon: Albertina Hugger, MD;  Location: [facility];  Service: Endoscopy;  Laterality: N/A;  INGUINAL HERNIA REPAIR Left 05/05/2021   Procedure: OPEN LEFT INGUINAL HERNIA REPAIR;  Surgeon: Sim Dryer, MD;  Location: [facility];  Service: General;  Laterality: Left;   KNEE ARTHROSCOPY      reports that he has never smoked. He has never used smokeless tobacco. He reports current alcohol use. He reports that he does not use drugs. Allergies  Allergen Reactions   Tuberculin, Ppd    Family History  Problem Relation Age of Onset   Stroke Father    Colon cancer Neg Hx    Stomach cancer Neg  Hx    Pancreatic cancer Neg Hx    Esophageal cancer Neg Hx    Prior to Admission medications   Medication Sig Start Date End Date Taking? Authorizing Provider  Ascorbic Acid (VITAMIN C WITH ROSE HIPS) 500 MG tablet Take 500 mg by mouth 3 (three) times a week. No set days    [provider]  folic acid (FOLVITE) 400 MCG tablet Take 400 mcg by mouth 3 (three) times a week.    [provider]  glimepiride  (AMARYL ) 4 MG tablet Take 4 mg by mouth daily as needed. If CBG  is > 150    [provider]  metFORMIN  (GLUCOPHAGE ) 500 MG tablet Take 500 mg by mouth at bedtime. 12/22/19   [provider]  omeprazole  (PRILOSEC) 40 MG capsule Take 1 capsule (40 mg total) by mouth daily. 01/24/20   Simmons-Robinson, Judyann Number, MD  OVER THE COUNTER MEDICATION Take 1 tablet by mouth every other day. [place] Mood Plus    [provider]  OVER THE COUNTER MEDICATION Take 1 capsule by mouth every other day. BEET POWDER     [provider]  rosuvastatin  (CRESTOR ) 10 MG tablet Take 10 mg by mouth at bedtime.    [provider]  tadalafil  (CIALIS ) 5 MG tablet Take 5 mg by mouth 2 (two) times a week. Monday & Friday 08/20/15   [provider]  tamsulosin  (FLOMAX ) 0.4 MG CAPS capsule Take 0.4 mg by mouth daily.    [provider]  Zinc 50 MG TABS Take 50 mg by mouth 2 (two) times a week. No set days    [provider]                                                                                 Vitals:   11/04/23 0043  BP: (!) 161/95  Pulse: 92  Resp: 18  Temp: 98.4 F (36.9 C)  SpO2: 97%   Physical Exam Vitals and nursing note reviewed.  Constitutional:      General: He is not in acute distress. HENT:     Head: Normocephalic and atraumatic.     Right Ear: Hearing normal.     Left Ear: Hearing normal.     Nose: No nasal deformity.     Mouth/Throat:     Lips: Pink.  Eyes:     General: Lids are normal.      Extraocular Movements: Extraocular movements intact.  Cardiovascular:     Rate and Rhythm: Normal rate and regular rhythm.     Heart sounds: Normal heart sounds.  Pulmonary:  Effort: Pulmonary effort is normal.     Breath sounds: Normal breath sounds.  Abdominal:     General: Bowel sounds are normal. There is no distension.     Palpations: Abdomen is soft. There is no mass.     Tenderness: There is no abdominal tenderness.       Comments: Area of abdominal pain.   Musculoskeletal:     Right lower leg: No edema.     Left lower leg: No edema.  Skin:    General: Skin is warm.  Neurological:     General: No focal deficit present.     Mental Status: He is alert and oriented to person, place, and time.     Cranial Nerves: Cranial nerves 2-12 are intact.  Psychiatric:        Speech: Speech normal.     Labs on Admission: I have personally reviewed following labs and imaging studies CBC: Recent Labs  Lab 11/04/23 0054  WBC 5.8  NEUTROABS 3.4  HGB 11.3*  HCT 34.9*  MCV 102.3*  PLT 344   Basic Metabolic Panel: Recent Labs  Lab 11/04/23 0054  NA 139  K 3.8  CL 103  CO2 24  GLUCOSE 96  BUN 14  CREATININE 1.04  CALCIUM  9.6   GFR: CrCl cannot be calculated (Unknown ideal weight.). Liver Function Tests: Recent Labs  Lab 11/04/23 0054  AST 22  ALT 12  ALKPHOS 60  BILITOT 0.6  PROT 6.7  ALBUMIN 3.7   No results for input(s): "LIPASE", "AMYLASE" in the last 168 hours. No results for input(s): "AMMONIA" in the last 168 hours. Coagulation Profile: Recent Labs  Lab 11/04/23 0054  INR 0.9   Cardiac Enzymes: No results for input(s): "CKTOTAL", "CKMB", "CKMBINDEX", "TROPONINI" in the last 168 hours. BNP (last 3 results) No results for input(s): "PROBNP" in the last 8760 hours. HbA1C: No results for input(s): "HGBA1C" in the last 72 hours. CBG: No results for input(s): "GLUCAP" in the last 168 hours. Lipid Profile: No results for input(s): "CHOL", "HDL",  "LDLCALC", "TRIG", "CHOLHDL", "LDLDIRECT" in the last 72 hours. Thyroid Function Tests: No results for input(s): "TSH", "T4TOTAL", "FREET4", "T3FREE", "THYROIDAB" in the last 72 hours. Anemia Panel: No results for input(s): "VITAMINB12", "FOLATE", "FERRITIN", "TIBC", "IRON", "RETICCTPCT" in the last 72 hours. Urine analysis:    Component Value Date/Time   COLORURINE STRAW (A) 11/04/2023 0044   APPEARANCEUR CLEAR 11/04/2023 0044   LABSPEC 1.005 11/04/2023 0044   PHURINE 5.0 11/04/2023 0044   GLUCOSEU NEGATIVE 11/04/2023 0044   HGBUR NEGATIVE 11/04/2023 0044   BILIRUBINUR NEGATIVE 11/04/2023 0044   KETONESUR NEGATIVE 11/04/2023 0044   PROTEINUR NEGATIVE 11/04/2023 0044   NITRITE NEGATIVE 11/04/2023 0044   LEUKOCYTESUR NEGATIVE 11/04/2023 0044   Radiological Exams on Admission: No results found. Data Reviewed: Relevant notes from primary care and specialist visits, past discharge summaries as available in EHR, including Care Everywhere. Prior diagnostic testing as pertinent to current admission diagnoses, Updated medications and problem lists for reconciliation ED course, including vitals, labs, imaging, treatment and response to treatment,Triage notes, nursing and pharmacy notes and ED provider's notes Notable results as noted in HPI.Discussed case with EDMD/ ED APP/ or Specialty MD on call and as needed.  Assessment & Plan  >>Rectal bleeding Likely recurrent diverticular bleeding.   Repeat CBC in AM If another episode of repeat hematochezia: plan = stat CTA abd/pelvis to see if we can localize the likely diverticular bleed If positive CTA => call IR for embolization Type  and screen Tele monitor Transfuse if needed EDP going to send message to [facility] to let them know about re-admit (though I presume this is more of an IR issue if we can catch (localize) the presumed diverticular bleeding on CTA). Clear liquid diet only for the moment..  >>DM II: NPO. Currently on poct q4 hours.    >>Essential HTN: Vitals:   11/04/23 0043  BP: (!) 161/95  As needed hydralazine .   >>ABLA/ IDA:    Latest Ref Rng & Units 11/04/2023   12:54 AM 08/16/2021    1:12 AM 08/15/2021    1:27 AM  CBC  WBC 4.0 - 10.5 K/uL 5.8  7.5  7.3   Hemoglobin 13.0 - 17.0 g/dL 29.5  8.5  8.5   Hematocrit 39.0 - 52.0 % 34.9  25.5  25.8   Platelets 150 - 400 K/uL 344  267  249   Secondary to iron deficiency and GI loss, will type and screen IV PPI.  GIs been consulted for a.m. consult.   >>BPH: Continue patient on his Cialis  and Flomax .  Monitor I's and O's.    DVT prophylaxis:  Scd's  Consults:  GI Advance Care Planning:    Code Status: Full Code   Family Communication:  None  Disposition Plan:  Home  Severity of Illness: The appropriate patient status for this patient is OBSERVATION. Observation status is judged to be reasonable and necessary in order to provide the required intensity of service to ensure the patient's safety. The patient's presenting symptoms, physical exam findings, and initial radiographic and laboratory data in the context of their medical condition is felt to place them at decreased risk for further clinical deterioration. Furthermore, it is anticipated that the patient will be medically stable for discharge from the hospital within 2 midnights of admission.   Unresulted Labs (From admission, onward)    None       Meds ordered this encounter  Medications   OR Linked Order Group    acetaminophen  (TYLENOL ) tablet 650 mg    acetaminophen  (TYLENOL ) suppository 650 mg   morphine  (PF) 2 MG/ML injection 2 mg   OR Linked Order Group    ondansetron  (ZOFRAN ) tablet 4 mg    ondansetron  (ZOFRAN ) injection 4 mg   sodium chloride  flush (NS) 0.9 % injection 3-10 mL   sodium chloride  flush (NS) 0.9 % injection 3-10 mL   pantoprazole  (PROTONIX ) injection 40 mg   rosuvastatin  (CRESTOR ) tablet 10 mg   tamsulosin  (FLOMAX ) capsule 0.4 mg   tadalafil  (CIALIS ) tablet 5 mg     Monday & Friday      Orders Placed This Encounter  Procedures   CBC with Differential   Comprehensive metabolic panel   Urinalysis, Routine w reflex microscopic -Urine, Clean Catch   Protime-INR   Diet clear liquid Room service appropriate? Yes; Fluid consistency: Thin   SCDs   Cardiac Monitoring Continuous x 24 hours Indications for use: Other; other indications for use: Monitor for ischemia   Vital signs   Notify physician (specify)   Mobility Protocol: No Restrictions RN to initiate protocols based on patient's level of care   Refer to Sidebar Report Refer to ICU, Med-Surg, Progressive, and Step-Down Mobility Protocol Sidebars   Initiate Adult Central Line Maintenance and Catheter Protocol for patients with central line (CVC, PICC, Port, Hemodialysis, Trialysis)   If patient diabetic or glucose greater than 140 notify physician for Sliding Scale Insulin  Orders   Intake and Output   Do not  place and if present remove PureWick   Initiate Oral Care Protocol   Initiate Carrier Fluid Protocol   RN may order General Admission PRN Orders utilizing "General Admission PRN medications" (through manage orders) for the following patient needs: allergy symptoms (Claritin), cold sores (Carmex), cough (Robitussin DM), eye irritation (Liquifilm Tears), hemorrhoids (Tucks), indigestion (Maalox), minor skin irritation (Hydrocortisone Cream), muscle pain Lovena Rubinstein Gay), nose irritation (saline nasal spray) and sore throat (Chloraseptic spray).   Full code   Consult to hospitalist   Pulse oximetry check with vital signs   Oxygen therapy Mode or (Route): Nasal cannula; Liters Per Minute: 2; Keep O2 saturation between: greater than 92 %   Sample to Blood Bank   Place in observation (patient's expected length of stay will be less than 2 midnights)   Aspiration precautions   Fall precautions     Author: Lavanda Porter, MD  [facility] 11/04/2023 3:37 AM

## 2023-11-04 NOTE — Assessment & Plan Note (Addendum)
 11-04-2023 continue flomax .  11-05-2023 stable.  11-06-2023 stable.

## 2023-11-04 NOTE — ED Triage Notes (Signed)
 Patient reports rectal bleeding for several days with mild pain across lower abdomen , no emesis or diarrhea , denies fever or chills . History of diverticulitis.

## 2023-11-04 NOTE — Consult Note (Signed)
 Consultation  Referring Provider: TRH/Che Primary Care Physician:  Edda Goo, MD Primary Gastroenterologist:  Dr.Pyrtle  Reason for Consultation:  lower GI bleed  HPI: Derrick Thornton is a 78 y.o. male with history of diabetes mellitus, hypertension, colon polyps, and short segment Barrett's esophagus.  Patient has had 3-4 previous episodes of diverticular bleeding.  He was last hospitalized in March 2023 with an episode of diverticular bleeding. His last colonoscopy was done in August 2021 with finding of multiple diverticuli involving the right and left colon.  At that time there was blood from the splenic flexure to the rectum.  He had EGD done at that same setting with finding of a 3 cm hiatal hernia and short segment Barrett's esophagus.  Patient is not on any chronic blood thinners.  He says this episode started actually about 5 days ago with what he describes as "light bleeding with bowel movements with dark red blood.  This became darker , then he thought it had resolved.  He went for about 24 hours and then the bleeding came back and became more obviously dark and bloody.  Yesterday he says he had about 2 episodes and last night he had 2 episodes of dark bloody stool. He was not having any abdominal discomfort until about 24 hours ago when he started noticing left lower quadrant pain that has been persistent.  He says this is unusual for these episodes as he usually does not have any discomfort.  Not having any dysuria, no nausea or vomiting, no fever or chills.  Labs on arrival to the ER last night with WBC 5.8/hemoglobin 11.3/hematocrit 34.9/MCV 102 Sodium 139/potassium 3.8/BUN 14/creatinine 1.04 LFTs within normal limits UA negative Pro time 12.2/INR 0.9  Hemoglobin early this a.m. was 10.2/hematocrit 31.3  No imaging done since admission. He has been hemodynamically stable   Past Medical History:  Diagnosis Date   Acute GI bleeding 06/15/2019   Anemia    Aortic  atherosclerosis (HCC)    Barrett's esophagus    DDD (degenerative disc disease), lumbar    Diabetes mellitus without complication (HCC)    Diverticulitis    Diverticulosis    GI bleed    Hiatal hernia    Hyperplastic colon polyp    Hypertension    Internal hemorrhoids    Nephrolithiasis     Past Surgical History:  Procedure Laterality Date   ACHILLES TENDON REPAIR     BIOPSY  01/22/2020   Procedure: BIOPSY;  Surgeon: Albertina Hugger, MD;  Location: [facility];  Service: Endoscopy;;   COLONOSCOPY WITH PROPOFOL  N/A 01/22/2020   Procedure: COLONOSCOPY WITH PROPOFOL ;  Surgeon: Albertina Hugger, MD;  Location: [facility];  Service: Endoscopy;  Laterality: N/A;   ESOPHAGOGASTRODUODENOSCOPY (EGD) WITH PROPOFOL  N/A 01/22/2020   Procedure: ESOPHAGOGASTRODUODENOSCOPY (EGD) WITH PROPOFOL ;  Surgeon: Albertina Hugger, MD;  Location: [facility];  Service: Endoscopy;  Laterality: N/A;   INGUINAL HERNIA REPAIR Left 05/05/2021   Procedure: OPEN LEFT INGUINAL HERNIA REPAIR;  Surgeon: Sim Dryer, MD;  Location: [facility];  Service: General;  Laterality: Left;   KNEE ARTHROSCOPY      Prior to Admission medications   Medication Sig Start Date End Date Taking? Authorizing Provider  Ascorbic Acid (VITAMIN C WITH ROSE HIPS) 500 MG tablet Take 500 mg by mouth 3 (three) times a week. No set days   Yes [provider]  folic acid (FOLVITE) 400 MCG tablet Take 400 mcg by mouth 3 (  three) times a week.   Yes [provider]  glimepiride  (AMARYL ) 4 MG tablet Take 1 mg by mouth daily as needed. If CBG  is > 150   Yes [provider]  losartan (COZAAR) 50 MG tablet Take 50 mg by mouth daily. 09/27/23  Yes [provider]  omeprazole  (PRILOSEC) 40 MG capsule Take 1 capsule (40 mg total) by mouth daily. Patient taking differently: Take 40 mg by mouth daily as needed (for heartburn). 01/24/20  Yes Simmons-Robinson, Makiera, MD  tadalafil  (CIALIS ) 5 MG  tablet Take 5 mg by mouth 2 (two) times a week. Monday & Friday 08/20/15  Yes [provider]  tamsulosin  (FLOMAX ) 0.4 MG CAPS capsule Take 0.4 mg by mouth daily.   Yes [provider]  Zinc 50 MG TABS Take 50 mg by mouth 2 (two) times a week. No set days   Yes [provider]  rosuvastatin  (CRESTOR ) 10 MG tablet Take 10 mg by mouth at bedtime. Patient not taking: Reported on 11/04/2023    [provider]    Current Facility-Administered Medications  Medication Dose Route Frequency Provider Last Rate Last Admin   acetaminophen  (TYLENOL ) tablet 650 mg  650 mg Oral Q6H PRN Patel, Ekta V, MD       Or   acetaminophen  (TYLENOL ) suppository 650 mg  650 mg Rectal Q6H PRN Lavanda Porter, MD       morphine  (PF) 2 MG/ML injection 2 mg  2 mg Intravenous Q2H PRN Lavanda Porter, MD       ondansetron  (ZOFRAN ) tablet 4 mg  4 mg Oral Q6H PRN Lavanda Porter, MD       Or   ondansetron  (ZOFRAN ) injection 4 mg  4 mg Intravenous Q6H PRN Patel, Ekta V, MD       pantoprazole  (PROTONIX ) injection 40 mg  40 mg Intravenous Q12H Lavanda Porter, MD   40 mg at 11/04/23 0440   rosuvastatin  (CRESTOR ) tablet 10 mg  10 mg Oral QHS Patel, Ekta V, MD       sodium chloride  flush (NS) 0.9 % injection 3-10 mL  3-10 mL Intravenous Q12H Lavanda Porter, MD       sodium chloride  flush (NS) 0.9 % injection 3-10 mL  3-10 mL Intravenous PRN Lavanda Porter, MD       tadalafil  (CIALIS ) tablet 5 mg  5 mg Oral Once per day on Monday Friday Lavanda Porter, MD       tamsulosin  (FLOMAX ) capsule 0.4 mg  0.4 mg Oral Daily Lavanda Porter, MD        Allergies as of 11/04/2023 - Review Complete 11/04/2023  Allergen Reaction Noted   Tuberculin, ppd  02/06/2006    Family History  Problem Relation Age of Onset   Stroke Father    Colon cancer Neg Hx    Stomach cancer Neg Hx    Pancreatic cancer Neg Hx    Esophageal cancer Neg Hx     Social History   Socioeconomic History   Marital status: Married    Spouse  name: Not on file   Number of children: Not on file   Years of education: Not on file   Highest education level: Not on file  Occupational History   Not on file  Tobacco Use   Smoking status: Never   Smokeless tobacco: Never  Vaping Use   Vaping status: Never Used  Substance and Sexual Activity   Alcohol use: Yes  Comment: occ   Drug use: Never   Sexual activity: Yes  Other Topics Concern   Not on file  Social History Narrative   Not on file   Social Drivers of Health   Financial Resource Strain: Low Risk  (11/03/2023)   Received from [facility]   Overall Financial Resource Strain (CARDIA)    Difficulty of Paying Living Expenses: Not hard at all  Food Insecurity: No Food Insecurity (11/04/2023)   Hunger Vital Sign    Worried About Running Out of Food in the Last Year: Never true    Ran Out of Food in the Last Year: Never true  Transportation Needs: No Transportation Needs (11/03/2023)   Received from [facility] - Transportation    Lack of Transportation (Medical): No    Lack of Transportation (Non-Medical): No  Physical Activity: Not on file  Stress: Not on file  Social Connections: Not on file  Intimate Partner Violence: Not At Risk (02/07/2023)   Received from [facility]    Abuse Screen    Feels Unsafe at Home or Work/School: no    Feels Threatened by Someone: no    Does Anyone Try to Keep You From Having Contact with Others or Doing Things Outside Your Home?: no    Physical Signs of Abuse Present: no    Review of Systems: Pertinent positive and negative review of systems were noted in the above HPI section.  All other review of systems was otherwise negative.   Physical Exam: Vital signs in last 24 hours: Temp:  [97.5 F (36.4 C)-98.5 F (36.9 C)] 97.7 F (36.5 C) (06/06 0836) Pulse Rate:  [51-92] 66 (06/06 0836) Resp:  [18-19] 19 (06/06 0836) BP: (140-161)/(82-95) 140/87 (06/06 0836) SpO2:  [95 %-99 %] 95 % (06/06  0836) Last BM Date : 11/04/23 General:   Alert,  Well-developed, well-nourished, pleasant and cooperative in NAD Head:  Normocephalic and atraumatic. Eyes:  Sclera clear, no icterus.   Conjunctiva pink. Ears:  Normal auditory acuity. Nose:  No deformity, discharge,  or lesions. Mouth:  No deformity or lesions.   Neck:  Supple; no masses or thyromegaly. Lungs:  Clear throughout to auscultation.   No wheezes, crackles, or rhonchi.  Heart:  Regular rate and rhythm; no murmurs, clicks, rubs,  or gallops. Abdomen:  Soft, sounds are present, there is tenderness in the left lower quadrant, no guarding or rebound,nonpalp mass or hsm.   Rectal: Not done, documented dark red blood per ER  Msk:  Symmetrical without gross deformities. . Pulses:  Normal pulses noted. Extremities:  Without clubbing or edema. Neurologic:  Alert and  oriented x4;  grossly normal neurologically. Skin:  Intact without significant lesions or rashes.. Psych:  Alert and cooperative. Normal mood and affect.  Intake/Output from previous day: No intake/output data recorded. Intake/Output this shift: No intake/output data recorded.  Lab Results: Recent Labs    11/04/23 0054 11/04/23 0440  WBC 5.8 7.7  HGB 11.3* 10.2*  HCT 34.9* 31.3*  PLT 344 306   BMET Recent Labs    11/04/23 0054 11/04/23 0440  NA 139 138  K 3.8 3.8  CL 103 104  CO2 24 27  GLUCOSE 96 101*  BUN 14 12  CREATININE 1.04 1.02  CALCIUM  9.6 9.0   LFT Recent Labs    11/04/23 0440  PROT 6.2*  ALBUMIN 3.3*  AST 17  ALT 14  ALKPHOS 55  BILITOT 0.6   PT/INR Recent Labs  11/04/23 0054  LABPROT 12.2  INR 0.9   Hepatitis Panel No results for input(s): "HEPBSAG", "HCVAB", "HEPAIGM", "HEPBIGM" in the last 72 hours.   IMPRESSION:  #70 78 year old African-American male with previous history of diverticular hemorrhage, last hospitalized in 2023.  Patient presents now with 4 to 5-day history of what started as low-grade bleeding with  dark red blood per rectum.  He thought it had resolved, then recurred Wednesday into Thursday with larger amounts of more obvious dark bloody bowel movements.  He had 2 bowel movements yesterday morning and then after arrival to the hospital had 2 episodes. Hemoglobin 11.2 on admission, down to 10.2 this a.m.  Patient says he has been having left lower quadrant abdominal pain/discomfort over the past 24 hours or so which has been fairly constant, not severe but not typical for these episodes. He is tender on exam  #2 history of short segment Barrett's, last EGD August 2021 #3 diabetes mellitus #4.  Hypertension #5 anemia macrocytic-current anemia secondary to GI blood loss, will check B12 folate  Plan clear liquid diet; Serial hemoglobins every 8 hours and transfuse as indicated Proceed with CT of the abdomen pelvis today to assure no associated diverticulitis or other colonic inflammatory process  Would not anticipate need for repeat colonoscopy this admission from a bleeding perspective, however will await CT findings.  GI will follow with you    Averee Harb PA-C 11/04/2023, 9:40 AM

## 2023-11-04 NOTE — Hospital Course (Signed)
 HPI: Derrick Thornton is a 78 y.o. male with past medical history  ofDM2, HTN, prior GIB, Barrett's esophagus,GIB from diverticular origin admitted in march 2023 with similar episode of rectal bleeding.   Patient states that it really has been going on for 4 days this is his fifth time in the past few years that he has had this.  Today was the worse when he filled the commode with red blood.  No reports of alcohol drugs or tobacco or NSAIDs   Significant Events: Admitted 11/04/2023 for rectal bleeding   Admission Labs: WBC 5.8, HgB 11.3, plt 344 INR 0.9 Na 139, K 3.8, CO2 of 24, BUN 14, Scr 1.04, glu 96 UA negative  Admission Imaging Studies:   Significant Labs:   Significant Imaging Studies:   Antibiotic Therapy: Anti-infectives (From admission, onward)    None       Procedures:   Consultants: GI - [place]

## 2023-11-04 NOTE — ED Provider Notes (Signed)
 [facility] Provider Note   CSN: [id number] Arrival date & time: 11/04/23  0038     History  Chief Complaint  Patient presents with   Rectal Bleeding    Derrick Thornton is a 78 y.o. male.  The history is provided by the patient.  Patient history of hypertension, diabetes, diverticulosis presents with rectal bleeding.  Patient reports over the past 2-3 days he has had minimal lower abdominal pain, but also having bloody stools.  No fevers or vomiting.  He is not on anticoagulation.  Patient reports he has had these episodes previously He has required admission previously    Past Medical History:  Diagnosis Date   Acute GI bleeding 06/15/2019   Anemia    Aortic atherosclerosis (HCC)    Barrett's esophagus    DDD (degenerative disc disease), lumbar    Diabetes mellitus without complication (HCC)    Diverticulitis    Diverticulosis    GI bleed    Hiatal hernia    Hyperplastic colon polyp    Hypertension    Internal hemorrhoids    Nephrolithiasis     Home Medications Prior to Admission medications   Medication Sig Start Date End Date Taking? Authorizing Provider  Ascorbic Acid (VITAMIN C WITH ROSE HIPS) 500 MG tablet Take 500 mg by mouth 3 (three) times a week. No set days    [provider]  folic acid (FOLVITE) 400 MCG tablet Take 400 mcg by mouth 3 (three) times a week.    [provider]  glimepiride  (AMARYL ) 4 MG tablet Take 4 mg by mouth daily as needed. If CBG  is > 150    [provider]  metFORMIN  (GLUCOPHAGE ) 500 MG tablet Take 500 mg by mouth at bedtime. 12/22/19   [provider]  omeprazole  (PRILOSEC) 40 MG capsule Take 1 capsule (40 mg total) by mouth daily. 01/24/20   Simmons-Robinson, Judyann Number, MD  OVER THE COUNTER MEDICATION Take 1 tablet by mouth every other day. [place] Mood Plus    [provider]  OVER THE COUNTER MEDICATION Take 1 capsule by mouth every other  day. BEET POWDER     [provider]  rosuvastatin  (CRESTOR ) 10 MG tablet Take 10 mg by mouth at bedtime.    [provider]  tadalafil  (CIALIS ) 5 MG tablet Take 5 mg by mouth 2 (two) times a week. Monday & Friday 08/20/15   [provider]  tamsulosin  (FLOMAX ) 0.4 MG CAPS capsule Take 0.4 mg by mouth daily.    [provider]  Zinc 50 MG TABS Take 50 mg by mouth 2 (two) times a week. No set days    [provider]      Allergies    Tuberculin, ppd    Review of Systems   Review of Systems  Constitutional:  Negative for fever.  Gastrointestinal:  Positive for blood in stool. Negative for vomiting.    Physical Exam Updated Vital Signs BP (!) 161/95 (BP Location: Right Arm)   Pulse 92   Temp 98.4 F (36.9 C)   Resp 18   SpO2 97%  Physical Exam CONSTITUTIONAL: Well developed/well nourished, no distress HEAD: Normocephalic/atraumatic EYES: EOMI/PERRL ENMT: Mucous membranes moist NECK: supple no meningeal signs CV: S1/S2 noted, no murmurs/rubs/gallops noted LUNGS: Lungs are clear to auscultation bilaterally, no apparent distress ABDOMEN: soft, nontender Rectal-declined NEURO: Pt is awake/alert/appropriate, moves all extremitiesx4.  No facial droop.  Patient walks around the ER in no  distress SKIN: warm, color normal PSYCH: no abnormalities of mood noted, alert and oriented to situation  ED Results / Procedures / Treatments   Labs (all labs ordered are listed, but only abnormal results are displayed) Labs Reviewed  CBC WITH DIFFERENTIAL/PLATELET - Abnormal; Notable for the following components:      Result Value   RBC 3.41 (*)    Hemoglobin 11.3 (*)    HCT 34.9 (*)    MCV 102.3 (*)    All other components within normal limits  URINALYSIS, ROUTINE W REFLEX MICROSCOPIC - Abnormal; Notable for the following components:   Color, Urine STRAW (*)    All other components within normal limits  COMPREHENSIVE METABOLIC PANEL WITH GFR   PROTIME-INR  SAMPLE TO BLOOD BANK    EKG None  Radiology No results found.  Procedures Procedures    Medications Ordered in ED Medications - No data to display  ED Course/ Medical Decision Making/ A&P Clinical Course as of 11/04/23 0305  Fri Nov 04, 2023  0303 Patient with known history of diverticulosis with previous bleeding presents with bright red blood per rectum.  Patient went to the bathroom in the ER and showed me that his toilet was full of blood [DW]  0304 Patient currently is awake alert in no acute distress.  He is having no abdominal pain, will defer CT imaging for now. Patient has required admission previously as well as blood transfusions.  However he has no emergent indication for transfusion at this time [DW]  0304 Discussed with Dr. Moishe Angel with [facility] for admission Epic notes sent to Dr. Brice Campi with [facility] who will see patient in the morning He recommends CT angio if patient has any worsening bleeding symptoms [DW]    Clinical Course User Index [DW] Eldon Greenland, MD                                 Medical Decision Making Amount and/or Complexity of Data Reviewed Labs: ordered.  Risk Decision regarding hospitalization.   This patient presents to the ED for concern of rectal bleeding, this involves an extensive number of treatment options, and is a complaint that carries with it a high risk of complications and morbidity.  The differential diagnosis includes but is not limited to diverticular bleed, anal fissure, hemorrhoidal bleeding, AVM, brisk upper GI bleed  Comorbidities that complicate the patient evaluation: Patient's presentation is complicated by their history of hypertension and previous diverticular bleed   Additional history obtained: Records reviewed previous admission documents  Lab Tests: I Ordered, and personally interpreted labs.  The pertinent results include: Mild anemia  Test Considered: CT abdomen pelvis was  considered, but since patient is pain-free and hemodynamically appropriate will defer for now   Consultations Obtained: I requested consultation with the admitting physician [facility] and consultant gastroenterology, and discussed  findings as well as pertinent plan - they recommend: Admit  Reevaluation: After the interventions noted above, I reevaluated the patient and found that they have :stayed the same  Complexity of problems addressed: Patient's presentation is most consistent with  acute presentation with potential threat to life or bodily function  Disposition: After consideration of the diagnostic results and the patient's response to treatment,  I feel that the patent would benefit from admission  .           Final Clinical Impression(s) / ED Diagnoses Final diagnoses:  Rectal bleeding  Rx / DC Orders ED Discharge Orders     None         Eldon Greenland, MD 11/04/23 [phone]

## 2023-11-04 NOTE — Assessment & Plan Note (Addendum)
 11-04-2023 stable.  11-05-2023 stable.

## 2023-11-04 NOTE — Progress Notes (Signed)
 PROGRESS NOTE    Derrick Thornton  ZOX:[id number] DOB: [birth year] DOA: 11/04/2023 PCP: Edda Goo, MD  Subjective: Pt seen and examined. GI consulted. CTA for GI bleeding negative for active bleeding. No more bright red blood per rectum since this 5 AM.     Hospital Course: HPI: Derrick Thornton is a 78 y.o. male with past medical history  ofDM2, HTN, prior GIB, Barrett's esophagus,GIB from diverticular origin admitted in march 2023 with similar episode of rectal bleeding.   Patient states that it really has been going on for 4 days this is his fifth time in the past few years that he has had this.  Today was the worse when he filled the commode with red blood.  No reports of alcohol drugs or tobacco or NSAIDs   Significant Events: Admitted 11/04/2023 for rectal bleeding   Admission Labs: WBC 5.8, HgB 11.3, plt 344 INR 0.9 Na 139, K 3.8, CO2 of 24, BUN 14, Scr 1.04, glu 96 UA negative  Admission Imaging Studies:   Significant Labs:   Significant Imaging Studies:   Antibiotic Therapy: Anti-infectives (From admission, onward)    None       Procedures:   Consultants: GI - [place]    Assessment and Plan: * Rectal bleeding 11-04-2023 likely diverticular bleed. Hopefully it has self resolved. Continue with q8h H&H. Transfuse for HgB less than 7.0 g/dl. Pt verbally consents to PRBC Transfusion if needed. He denies being a Jehovah's witness.  Diverticulosis of colon with hemorrhage 11-04-2023 likely diverticular bleed. Hopefully it has self resolved. Continue with q8h H&H. Transfuse for HgB less than 7.0 g/dl. Pt verbally consents to PRBC Transfusion if needed. He denies being a Jehovah's witness.  Lower abdominal pain 11-04-2023 likely diverticular bleed. Hopefully it has self resolved. Continue with q8h H&H. Transfuse for HgB less than 7.0 g/dl. Pt verbally consents to PRBC Transfusion if needed. He denies being a Jehovah's witness.  Acute post-hemorrhagic  anemia 11-04-2023 likely diverticular bleed. Hopefully it has self resolved. Continue with q8h H&H. Transfuse for HgB less than 7.0 g/dl. Pt verbally consents to PRBC Transfusion if needed. He denies being a Jehovah's witness.  BPH (benign prostatic hyperplasia) 11-04-2023 continue flomax .  Iron deficiency anemia 11-04-2023 may need iron infusions as outpatient.  Type 2 diabetes mellitus (HCC) 11-04-2023 stable.  Essential hypertension 11-04-2023 pt on chronic Cialis . Do NOT give any form of nitroglycerin!!  DVT prophylaxis: SCDs Start: 11/04/23 0317    Code Status: Full Code Family Communication: no family at bedside. He is decisional. Disposition Plan: return home Reason for continuing need for hospitalization: monitoring HgB and need for PRBC transfusion. On clear liquids.  Objective: Vitals:   11/04/23 0500 11/04/23 0525 11/04/23 0836 11/04/23 1601  BP: (!) 142/91 (!) 147/82 (!) 140/87 132/85  Pulse: 64 61 66 78  Resp:  18 19   Temp: 98.5 F (36.9 C) (!) 97.5 F (36.4 C) 97.7 F (36.5 C) 98.7 F (37.1 C)  TempSrc: Oral Oral Oral Oral  SpO2: 99% 99% 95% 97%   No intake or output data in the 24 hours ending 11/04/23 1736 There were no vitals filed for this visit.  Examination:  Physical Exam Vitals and nursing note reviewed.  Constitutional:      General: He is not in acute distress.    Appearance: Normal appearance. He is not toxic-appearing or diaphoretic.  HENT:     Head: Normocephalic and atraumatic.     Nose: Nose normal.  Eyes:  General: No scleral icterus. Cardiovascular:     Rate and Rhythm: Normal rate and regular rhythm.  Pulmonary:     Effort: Pulmonary effort is normal. No respiratory distress.     Breath sounds: Normal breath sounds.  Abdominal:     General: Abdomen is flat. Bowel sounds are normal.     Palpations: Abdomen is soft.  Musculoskeletal:     Right lower leg: No edema.     Left lower leg: No edema.  Skin:    General: Skin is  warm and dry.     Capillary Refill: Capillary refill takes less than 2 seconds.  Neurological:     General: No focal deficit present.     Mental Status: He is alert and oriented to person, place, and time.     Data Reviewed: I have personally reviewed following labs and imaging studies  CBC: Recent Labs  Lab 11/04/23 0054 11/04/23 0440 11/04/23 1136  WBC 5.8 7.7  --   NEUTROABS 3.4 5.8  --   HGB 11.3* 10.2* 9.9*  HCT 34.9* 31.3* 29.9*  MCV 102.3* 101.3*  --   PLT 344 306  --    Basic Metabolic Panel: Recent Labs  Lab 11/04/23 0054 11/04/23 0440  NA 139 138  K 3.8 3.8  CL 103 104  CO2 24 27  GLUCOSE 96 101*  BUN 14 12  CREATININE 1.04 1.02  CALCIUM  9.6 9.0  MG  --  1.7  PHOS  --  2.7   GFR: CrCl cannot be calculated (Unknown ideal weight.). Liver Function Tests: Recent Labs  Lab 11/04/23 0054 11/04/23 0440  AST 22 17  ALT 12 14  ALKPHOS 60 55  BILITOT 0.6 0.6  PROT 6.7 6.2*  ALBUMIN 3.7 3.3*   Coagulation Profile: Recent Labs  Lab 11/04/23 0054  INR 0.9   Radiology Studies: CT ANGIO ABDOMEN PELVIS  W & WO CONTRAST Result Date: 11/04/2023 CLINICAL DATA:  Lower GI bleed LLQ pain and oribable diverticular bleeding. EXAM: CTA ABDOMEN AND PELVIS WITHOUT AND WITH CONTRAST TECHNIQUE: Multidetector CT imaging of the abdomen and pelvis was performed using the standard protocol during bolus administration of intravenous contrast. Multiplanar reconstructed images and MIPs were obtained and reviewed to evaluate the vascular anatomy. RADIATION DOSE REDUCTION: This exam was performed according to the departmental dose-optimization program which includes automated exposure control, adjustment of the mA and/or kV according to patient size and/or use of iterative reconstruction technique. CONTRAST:  75mL OMNIPAQUE  IOHEXOL  350 MG/ML SOLN COMPARISON:  CT Angiography abdomen and pelvis from 08/11/2021. FINDINGS: VASCULAR Aorta: Normal caliber aorta without aneurysm, dissection,  vasculitis or significant stenosis. Celiac: Patent without evidence of aneurysm, dissection, vasculitis or significant stenosis. SMA: Patent without evidence of aneurysm, dissection, vasculitis or significant stenosis. Renals: Both renal arteries are patent without evidence of aneurysm, dissection, vasculitis, fibromuscular dysplasia or significant stenosis. IMA: Patent without evidence of aneurysm, dissection, vasculitis or significant stenosis. Inflow: Patent without evidence of aneurysm, dissection, vasculitis or significant stenosis. Proximal Outflow: Bilateral common femoral and visualized portions of the superficial and profunda femoral arteries are patent without evidence of aneurysm, dissection, vasculitis or significant stenosis. Veins: No obvious venous abnormality within the limitations of this arterial phase study. Review of the MIP images confirms the above findings. NON-VASCULAR Lower chest: The lung bases are clear. No pleural effusion. The heart is normal in size. No pericardial effusion. Hepatobiliary: The liver is normal in size. Non-cirrhotic configuration. No suspicious mass. No intrahepatic or extrahepatic bile duct dilation. No  calcified gallstones. Normal gallbladder wall thickness. No pericholecystic inflammatory changes. Pancreas: Unremarkable. No pancreatic ductal dilatation or surrounding inflammatory changes. Spleen: Within normal limits. No focal lesion. Adrenals/Urinary Tract: Adrenal glands are unremarkable. No suspicious renal mass. There are several simple renal cysts in the left kidney with largest in the interpolar region measuring up to 2.4 x 2.5 cm. There are 2 adjacent nonobstructing calculi in the left kidney lower pole calyx with largest measuring up to 4 x 6 mm. No other nephroureterolithiasis on either side. Unremarkable urinary bladder. Stomach/Bowel: No disproportionate dilation of the small or large bowel loops. No evidence of abnormal bowel wall thickening or  inflammatory changes. The appendix is unremarkable. There are innumerable large diverticula throughout the colon, without imaging signs of diverticulitis. No evidence of active extravasation of contrast to suggest active GI bleeding. Vascular/Lymphatic: No ascites or pneumoperitoneum. No abdominal or pelvic lymphadenopathy, by size criteria. No aneurysmal dilation of the major abdominal arteries. There are mild peripheral atherosclerotic vascular calcifications of the aorta and its major branches. Reproductive: Enlarged prostate. Symmetric seminal vesicles. Other: There is a tiny fat containing umbilical hernia. There are postsurgical changes in the left inguinal region. The soft tissues and abdominal wall are otherwise unremarkable. Musculoskeletal: No suspicious osseous lesions. There are mild - moderate multilevel degenerative changes in the visualized spine. IMPRESSION: 1. No active extravasation of contrast to suggest active GI bleeding. 2. No mesenteric artery stenosis, aneurysm, dissection or occlusion. 3. Multiple colonic diverticula without diverticulitis. 4. Multiple other nonacute observations, as described above. Aortic Atherosclerosis (ICD10-I70.0). Electronically Signed   By: Beula Brunswick M.D.   On: 11/04/2023 15:13    Scheduled Meds:  pantoprazole  (PROTONIX ) IV  40 mg Intravenous Q12H   rosuvastatin   10 mg Oral QHS   sodium chloride  flush  3-10 mL Intravenous Q12H   tadalafil   5 mg Oral Once per day on Monday Friday   tamsulosin   0.4 mg Oral Daily   Continuous Infusions:   LOS: 0 days   Time spent: 50 minutes  Unk Garb, DO  [facility]  11/04/2023, 5:36 PM

## 2023-11-04 NOTE — Plan of Care (Signed)
   Problem: Nutrition: Goal: Adequate nutrition will be maintained Outcome: Progressing   Problem: Elimination: Goal: Will not experience complications related to bowel motility Outcome: Progressing

## 2023-11-04 NOTE — TOC Initial Note (Signed)
 Transition of Care [facility]) - Initial/Assessment Note    Patient Details  Name: Derrick Thornton MRN: [id number] Date of Birth: Jul 01, 1945  Transition of Care [facility] CM/SW Contact:    Jonathan Neighbor, RN Phone Number: 11/04/2023, 2:18 PM  Clinical Narrative:                  Pt is from home with his spouse. They are together all the time.  Pt and spouse drive.  Pt manages his own medications and denies any issues.  No DME at home.  TOC following for d/c needs.   Expected Discharge Plan: Home/Self Care Barriers to Discharge: Continued Medical Work up   Patient Goals and CMS Choice            Expected Discharge Plan and Services   Discharge Planning Services: CM Consult   Living arrangements for the past 2 months: Single Family Home                                      Prior Living Arrangements/Services Living arrangements for the past 2 months: Single Family Home Lives with:: Spouse Patient language and need for interpreter reviewed:: Yes Do you feel safe going back to the place where you live?: Yes        Care giver support system in place?: Yes (comment)   Criminal Activity/Legal Involvement Pertinent to Current Situation/Hospitalization: No - Comment as needed  Activities of Daily Living      Permission Sought/Granted                  Emotional Assessment Appearance:: Appears stated age Attitude/Demeanor/Rapport: Engaged Affect (typically observed): Accepting Orientation: : Oriented to Self, Oriented to Place, Oriented to  Time, Oriented to Situation   Psych Involvement: No (comment)  Admission diagnosis:  Rectal bleeding [K62.5] Patient Active Problem List   Diagnosis Date Noted   Lower abdominal pain 11/04/2023   Diverticulosis of colon with hemorrhage    Acute diverticulitis 08/11/2021   BPH (benign prostatic hyperplasia) 08/11/2021   Rectal bleeding 04/02/2020   Iron deficiency anemia    Unilateral primary osteoarthritis, right knee  11/08/2018   Chronic pain of right knee 11/08/2018   Acute post-hemorrhagic anemia 05/05/2018   HTN (hypertension) 05/05/2018   Type 2 diabetes mellitus (HCC) 05/05/2018   Internal hemorrhoids 05/05/2018   Diverticulosis 05/05/2018   PCP:  Edda Goo, MD Pharmacy:   [facility] [phone] - [place], [place] - [address] AT [facility] [address] [place] [address] Phone: [phone] Fax: [phone]     Social Drivers of Health (SDOH) Social History: SDOH Screenings   Food Insecurity: No Food Insecurity (11/04/2023)  Housing: Low Risk  (11/03/2023)   Received from [facility]  Transportation Needs: No Transportation Needs (11/03/2023)   Received from [facility]  Utilities: Not At Risk (11/03/2023)   Received from [facility]  Financial Resource Strain: Low Risk  (11/03/2023)   Received from [facility]  Tobacco Use: Low Risk  (11/04/2023)  Recent Concern: Tobacco Use - Medium Risk (11/03/2023)   Received from [facility]   SDOH Interventions: Food Insecurity Interventions: Intervention Not Indicated   Readmission Risk Interventions     No data to display

## 2023-11-05 DIAGNOSIS — D500 Iron deficiency anemia secondary to blood loss (chronic): Secondary | ICD-10-CM | POA: Diagnosis not present

## 2023-11-05 DIAGNOSIS — K922 Gastrointestinal hemorrhage, unspecified: Secondary | ICD-10-CM | POA: Diagnosis not present

## 2023-11-05 DIAGNOSIS — D62 Acute posthemorrhagic anemia: Secondary | ICD-10-CM | POA: Diagnosis not present

## 2023-11-05 DIAGNOSIS — R1030 Lower abdominal pain, unspecified: Secondary | ICD-10-CM | POA: Diagnosis not present

## 2023-11-05 DIAGNOSIS — K625 Hemorrhage of anus and rectum: Secondary | ICD-10-CM | POA: Diagnosis not present

## 2023-11-05 DIAGNOSIS — D509 Iron deficiency anemia, unspecified: Secondary | ICD-10-CM

## 2023-11-05 DIAGNOSIS — I10 Essential (primary) hypertension: Secondary | ICD-10-CM | POA: Diagnosis not present

## 2023-11-05 LAB — HEMOGLOBIN AND HEMATOCRIT, BLOOD
HCT: 30.9 % — ABNORMAL LOW (ref 39.0–52.0)
Hemoglobin: 10.2 g/dL — ABNORMAL LOW (ref 13.0–17.0)

## 2023-11-05 NOTE — Plan of Care (Signed)

## 2023-11-05 NOTE — Plan of Care (Signed)
   Problem: Activity: Goal: Risk for activity intolerance will decrease Outcome: Progressing   Problem: Nutrition: Goal: Adequate nutrition will be maintained Outcome: Progressing

## 2023-11-05 NOTE — Progress Notes (Signed)
 Progress Note   Subjective  No further bleeding since yesterday around 10 AM. CTA done and negative. Hgb stable. He wants to eat.    Objective   Vital signs in last 24 hours: Temp:  [97.9 F (36.6 C)-98.7 F (37.1 C)] 97.9 F (36.6 C) (06/07 0755) Pulse Rate:  [66-78] 66 (06/07 0755) Resp:  [18] 18 (06/07 0755) BP: (118-132)/(73-85) 123/82 (06/07 0755) SpO2:  [95 %-98 %] 98 % (06/07 0755) Last BM Date : 11/04/23 General:    AA male in NAD Neurologic:  Alert and oriented,  grossly normal neurologically. Psych:  Cooperative. Normal mood and affect.  Intake/Output from previous day: 06/06 0701 - 06/07 0700 In: 488 [P.O.:488] Out: -  Intake/Output this shift: No intake/output data recorded.  Lab Results: Recent Labs    11/04/23 0054 11/04/23 0440 11/04/23 1136 11/04/23 1924 11/05/23 0456  WBC 5.8 7.7  --   --   --   HGB 11.3* 10.2* 9.9* 10.4* 10.2*  HCT 34.9* 31.3* 29.9* 31.4* 30.9*  PLT 344 306  --   --   --    BMET Recent Labs    11/04/23 0054 11/04/23 0440  NA 139 138  K 3.8 3.8  CL 103 104  CO2 24 27  GLUCOSE 96 101*  BUN 14 12  CREATININE 1.04 1.02  CALCIUM  9.6 9.0   LFT Recent Labs    11/04/23 0440  PROT 6.2*  ALBUMIN 3.3*  AST 17  ALT 14  ALKPHOS 55  BILITOT 0.6   PT/INR Recent Labs    11/04/23 0054  LABPROT 12.2  INR 0.9    Studies/Results: CT ANGIO ABDOMEN PELVIS  W & WO CONTRAST Result Date: 11/04/2023 CLINICAL DATA:  Lower GI bleed LLQ pain and oribable diverticular bleeding. EXAM: CTA ABDOMEN AND PELVIS WITHOUT AND WITH CONTRAST TECHNIQUE: Multidetector CT imaging of the abdomen and pelvis was performed using the standard protocol during bolus administration of intravenous contrast. Multiplanar reconstructed images and MIPs were obtained and reviewed to evaluate the vascular anatomy. RADIATION DOSE REDUCTION: This exam was performed according to the departmental dose-optimization program which includes automated exposure  control, adjustment of the mA and/or kV according to patient size and/or use of iterative reconstruction technique. CONTRAST:  75mL OMNIPAQUE  IOHEXOL  350 MG/ML SOLN COMPARISON:  CT Angiography abdomen and pelvis from 08/11/2021. FINDINGS: VASCULAR Aorta: Normal caliber aorta without aneurysm, dissection, vasculitis or significant stenosis. Celiac: Patent without evidence of aneurysm, dissection, vasculitis or significant stenosis. SMA: Patent without evidence of aneurysm, dissection, vasculitis or significant stenosis. Renals: Both renal arteries are patent without evidence of aneurysm, dissection, vasculitis, fibromuscular dysplasia or significant stenosis. IMA: Patent without evidence of aneurysm, dissection, vasculitis or significant stenosis. Inflow: Patent without evidence of aneurysm, dissection, vasculitis or significant stenosis. Proximal Outflow: Bilateral common femoral and visualized portions of the superficial and profunda femoral arteries are patent without evidence of aneurysm, dissection, vasculitis or significant stenosis. Veins: No obvious venous abnormality within the limitations of this arterial phase study. Review of the MIP images confirms the above findings. NON-VASCULAR Lower chest: The lung bases are clear. No pleural effusion. The heart is normal in size. No pericardial effusion. Hepatobiliary: The liver is normal in size. Non-cirrhotic configuration. No suspicious mass. No intrahepatic or extrahepatic bile duct dilation. No calcified gallstones. Normal gallbladder wall thickness. No pericholecystic inflammatory changes. Pancreas: Unremarkable. No pancreatic ductal dilatation or surrounding inflammatory changes. Spleen: Within normal limits. No focal lesion. Adrenals/Urinary Tract: Adrenal glands are unremarkable. No  suspicious renal mass. There are several simple renal cysts in the left kidney with largest in the interpolar region measuring up to 2.4 x 2.5 cm. There are 2 adjacent  nonobstructing calculi in the left kidney lower pole calyx with largest measuring up to 4 x 6 mm. No other nephroureterolithiasis on either side. Unremarkable urinary bladder. Stomach/Bowel: No disproportionate dilation of the small or large bowel loops. No evidence of abnormal bowel wall thickening or inflammatory changes. The appendix is unremarkable. There are innumerable large diverticula throughout the colon, without imaging signs of diverticulitis. No evidence of active extravasation of contrast to suggest active GI bleeding. Vascular/Lymphatic: No ascites or pneumoperitoneum. No abdominal or pelvic lymphadenopathy, by size criteria. No aneurysmal dilation of the major abdominal arteries. There are mild peripheral atherosclerotic vascular calcifications of the aorta and its major branches. Reproductive: Enlarged prostate. Symmetric seminal vesicles. Other: There is a tiny fat containing umbilical hernia. There are postsurgical changes in the left inguinal region. The soft tissues and abdominal wall are otherwise unremarkable. Musculoskeletal: No suspicious osseous lesions. There are mild - moderate multilevel degenerative changes in the visualized spine. IMPRESSION: 1. No active extravasation of contrast to suggest active GI bleeding. 2. No mesenteric artery stenosis, aneurysm, dissection or occlusion. 3. Multiple colonic diverticula without diverticulitis. 4. Multiple other nonacute observations, as described above. Aortic Atherosclerosis (ICD10-I70.0). Electronically Signed   By: Beula Brunswick M.D.   On: 11/04/2023 15:13       Assessment / Plan:    78 y/o male here with the following  Lower GI bleed - very likely due to diverticulosis Anemia  No further output since yesterday AM. Hgb stable. CTA negative for active bleeding and negative for diverticulitis. He very likely has had a diverticular bleed, seems to have resolved at this point. Okay to progress diet today. I told him if stable he could  go home later today. He is not comfortable leaving the hospital today, he reports being discharged prematurely for diverticular bleed in the past and led to readmission. He is requesting to stay today and agreeable to go home tomorrow is stable. I think that is reasonable.  We will sign off for now, let me know if any questions in the interim.  Christi Coward, MD [facility]

## 2023-11-05 NOTE — Progress Notes (Signed)
 PROGRESS NOTE    Derrick Thornton  BJY:[id number] DOB: 01/31/1946 DOA: 11/04/2023 PCP: Edda Goo, MD  Subjective: Pt seen and examined. No further bleeding. HgB increased to 10.2. GI says pt can go home today. Pt declines to be discharged today. He states in the past, he has had to return to ER after being discharged 24 hours after bleeding stops. Wants to be monitor overnight and is agreeable to going home tomorrow. Diet advanced to soft diet.   Hospital Course: HPI: Derrick Thornton is a 78 y.o. male with past medical history  ofDM2, HTN, prior GIB, Barrett's esophagus,GIB from diverticular origin admitted in march 2023 with similar episode of rectal bleeding.   Patient states that it really has been going on for 4 days this is his fifth time in the past few years that he has had this.  Today was the worse when he filled the commode with red blood.  No reports of alcohol drugs or tobacco or NSAIDs   Significant Events: Admitted 11/04/2023 for rectal bleeding   Admission Labs: WBC 5.8, HgB 11.3, plt 344 INR 0.9 Na 139, K 3.8, CO2 of 24, BUN 14, Scr 1.04, glu 96 UA negative  Admission Imaging Studies:   Significant Labs:   Significant Imaging Studies:   Antibiotic Therapy: Anti-infectives (From admission, onward)    None       Procedures:   Consultants: GI - [place]    Assessment and Plan: * Rectal bleeding 11-04-2023 likely diverticular bleed. Hopefully it has self resolved. Continue with q8h H&H. Transfuse for HgB less than 7.0 g/dl. Pt verbally consents to PRBC Transfusion if needed. He denies being a Jehovah's witness.  11-05-2023 HgB up to 10.2 g/dl.  Nadir HgB of 9.9.  stop q8h H&H. Repeat CBC in AM. If stable, he can go home tomorrow.  Diverticulosis of colon with hemorrhage 11-04-2023 likely diverticular bleed. Hopefully it has self resolved. Continue with q8h H&H. Transfuse for HgB less than 7.0 g/dl. Pt verbally consents to PRBC Transfusion if needed. He  denies being a Jehovah's witness.  11-05-2023 HgB up to 10.2 g/dl.  Nadir HgB of 9.9.  stop q8h H&H. Repeat CBC in AM. If stable, he can go home tomorrow.  Lower abdominal pain 11-04-2023 likely diverticular bleed. Hopefully it has self resolved. Continue with q8h H&H. Transfuse for HgB less than 7.0 g/dl. Pt verbally consents to PRBC Transfusion if needed. He denies being a Jehovah's witness.  11-05-2023 HgB up to 10.2 g/dl.  Nadir HgB of 9.9.  stop q8h H&H. Repeat CBC in AM. If stable, he can go home tomorrow.  Acute post-hemorrhagic anemia 11-04-2023 likely diverticular bleed. Hopefully it has self resolved. Continue with q8h H&H. Transfuse for HgB less than 7.0 g/dl. Pt verbally consents to PRBC Transfusion if needed. He denies being a Jehovah's witness.  11-05-2023 HgB up to 10.2 g/dl.  Nadir HgB of 9.9.  stop q8h H&H. Repeat CBC in AM. If stable, he can go home tomorrow.  BPH (benign prostatic hyperplasia) 11-04-2023 continue flomax .  11-05-2023 stable.  Iron deficiency anemia 11-04-2023 may need iron infusions as outpatient.  11-05-2023 stable.  Type 2 diabetes mellitus (HCC) 11-04-2023 stable.  11-05-2023 stable.  Essential hypertension - pt on chronic Cialis . Do NOT give any form of nitroglycerin!! 11-04-2023 pt on chronic Cialis . Do NOT give any form of nitroglycerin!!  11-05-2023 stable.     DVT prophylaxis: SCDs Start: 11/04/23 0865    Code Status: Full Code Family Communication: no family at  bedside. Pt is decisional. Disposition Plan: return home Reason for continuing need for hospitalization: monitoring HgB. Possible DC in AM.  Objective: Vitals:   11/04/23 1601 11/04/23 1937 11/05/23 0435 11/05/23 0755  BP: 132/85 129/77 118/73 123/82  Pulse: 78 76 68 66  Resp:  18 18 18   Temp: 98.7 F (37.1 C) 98.4 F (36.9 C) 98.4 F (36.9 C) 97.9 F (36.6 C)  TempSrc: Oral Oral Oral   SpO2: 97% 97% 95% 98%    Intake/Output Summary (Last 24 hours) at 11/05/2023  1235 Last data filed at 11/04/2023 1917 Gross per 24 hour  Intake 488 ml  Output --  Net 488 ml   There were no vitals filed for this visit.  Examination:  Physical Exam Vitals and nursing note reviewed.  HENT:     Head: Normocephalic and atraumatic.     Nose: Nose normal.  Cardiovascular:     Rate and Rhythm: Normal rate and regular rhythm.  Pulmonary:     Effort: Pulmonary effort is normal.  Abdominal:     General: Abdomen is flat.  Skin:    General: Skin is warm and dry.     Capillary Refill: Capillary refill takes less than 2 seconds.  Neurological:     Mental Status: He is alert and oriented to person, place, and time.     Data Reviewed: I have personally reviewed following labs and imaging studies  CBC: Recent Labs  Lab 11/04/23 0054 11/04/23 0440 11/04/23 1136 11/04/23 1924 11/05/23 0456  WBC 5.8 7.7  --   --   --   NEUTROABS 3.4 5.8  --   --   --   HGB 11.3* 10.2* 9.9* 10.4* 10.2*  HCT 34.9* 31.3* 29.9* 31.4* 30.9*  MCV 102.3* 101.3*  --   --   --   PLT 344 306  --   --   --    Basic Metabolic Panel: Recent Labs  Lab 11/04/23 0054 11/04/23 0440  NA 139 138  K 3.8 3.8  CL 103 104  CO2 24 27  GLUCOSE 96 101*  BUN 14 12  CREATININE 1.04 1.02  CALCIUM  9.6 9.0  MG  --  1.7  PHOS  --  2.7   GFR: CrCl cannot be calculated (Unknown ideal weight.). Liver Function Tests: Recent Labs  Lab 11/04/23 0054 11/04/23 0440  AST 22 17  ALT 12 14  ALKPHOS 60 55  BILITOT 0.6 0.6  PROT 6.7 6.2*  ALBUMIN 3.7 3.3*   Coagulation Profile: Recent Labs  Lab 11/04/23 0054  INR 0.9   Radiology Studies: CT ANGIO ABDOMEN PELVIS  W & WO CONTRAST Result Date: 11/04/2023 CLINICAL DATA:  Lower GI bleed LLQ pain and oribable diverticular bleeding. EXAM: CTA ABDOMEN AND PELVIS WITHOUT AND WITH CONTRAST TECHNIQUE: Multidetector CT imaging of the abdomen and pelvis was performed using the standard protocol during bolus administration of intravenous contrast.  Multiplanar reconstructed images and MIPs were obtained and reviewed to evaluate the vascular anatomy. RADIATION DOSE REDUCTION: This exam was performed according to the departmental dose-optimization program which includes automated exposure control, adjustment of the mA and/or kV according to patient size and/or use of iterative reconstruction technique. CONTRAST:  75mL OMNIPAQUE  IOHEXOL  350 MG/ML SOLN COMPARISON:  CT Angiography abdomen and pelvis from 08/11/2021. FINDINGS: VASCULAR Aorta: Normal caliber aorta without aneurysm, dissection, vasculitis or significant stenosis. Celiac: Patent without evidence of aneurysm, dissection, vasculitis or significant stenosis. SMA: Patent without evidence of aneurysm, dissection, vasculitis or significant stenosis. Renals:  Both renal arteries are patent without evidence of aneurysm, dissection, vasculitis, fibromuscular dysplasia or significant stenosis. IMA: Patent without evidence of aneurysm, dissection, vasculitis or significant stenosis. Inflow: Patent without evidence of aneurysm, dissection, vasculitis or significant stenosis. Proximal Outflow: Bilateral common femoral and visualized portions of the superficial and profunda femoral arteries are patent without evidence of aneurysm, dissection, vasculitis or significant stenosis. Veins: No obvious venous abnormality within the limitations of this arterial phase study. Review of the MIP images confirms the above findings. NON-VASCULAR Lower chest: The lung bases are clear. No pleural effusion. The heart is normal in size. No pericardial effusion. Hepatobiliary: The liver is normal in size. Non-cirrhotic configuration. No suspicious mass. No intrahepatic or extrahepatic bile duct dilation. No calcified gallstones. Normal gallbladder wall thickness. No pericholecystic inflammatory changes. Pancreas: Unremarkable. No pancreatic ductal dilatation or surrounding inflammatory changes. Spleen: Within normal limits. No focal  lesion. Adrenals/Urinary Tract: Adrenal glands are unremarkable. No suspicious renal mass. There are several simple renal cysts in the left kidney with largest in the interpolar region measuring up to 2.4 x 2.5 cm. There are 2 adjacent nonobstructing calculi in the left kidney lower pole calyx with largest measuring up to 4 x 6 mm. No other nephroureterolithiasis on either side. Unremarkable urinary bladder. Stomach/Bowel: No disproportionate dilation of the small or large bowel loops. No evidence of abnormal bowel wall thickening or inflammatory changes. The appendix is unremarkable. There are innumerable large diverticula throughout the colon, without imaging signs of diverticulitis. No evidence of active extravasation of contrast to suggest active GI bleeding. Vascular/Lymphatic: No ascites or pneumoperitoneum. No abdominal or pelvic lymphadenopathy, by size criteria. No aneurysmal dilation of the major abdominal arteries. There are mild peripheral atherosclerotic vascular calcifications of the aorta and its major branches. Reproductive: Enlarged prostate. Symmetric seminal vesicles. Other: There is a tiny fat containing umbilical hernia. There are postsurgical changes in the left inguinal region. The soft tissues and abdominal wall are otherwise unremarkable. Musculoskeletal: No suspicious osseous lesions. There are mild - moderate multilevel degenerative changes in the visualized spine. IMPRESSION: 1. No active extravasation of contrast to suggest active GI bleeding. 2. No mesenteric artery stenosis, aneurysm, dissection or occlusion. 3. Multiple colonic diverticula without diverticulitis. 4. Multiple other nonacute observations, as described above. Aortic Atherosclerosis (ICD10-I70.0). Electronically Signed   By: Beula Brunswick M.D.   On: 11/04/2023 15:13    Scheduled Meds:  pantoprazole  (PROTONIX ) IV  40 mg Intravenous Q12H   rosuvastatin   10 mg Oral QHS   sodium chloride  flush  3-10 mL Intravenous Q12H    tadalafil   5 mg Oral Once per day on Monday Friday   tamsulosin   0.4 mg Oral Daily   Continuous Infusions:   LOS: 0 days   Time spent: 50 minutes  Unk Garb, DO  [facility]  11/05/2023, 12:35 PM

## 2023-11-06 DIAGNOSIS — D62 Acute posthemorrhagic anemia: Secondary | ICD-10-CM | POA: Diagnosis not present

## 2023-11-06 DIAGNOSIS — N400 Enlarged prostate without lower urinary tract symptoms: Secondary | ICD-10-CM

## 2023-11-06 DIAGNOSIS — K922 Gastrointestinal hemorrhage, unspecified: Secondary | ICD-10-CM | POA: Diagnosis not present

## 2023-11-06 DIAGNOSIS — K625 Hemorrhage of anus and rectum: Secondary | ICD-10-CM | POA: Diagnosis not present

## 2023-11-06 DIAGNOSIS — I10 Essential (primary) hypertension: Secondary | ICD-10-CM | POA: Diagnosis not present

## 2023-11-06 LAB — CBC WITH DIFFERENTIAL/PLATELET
Abs Immature Granulocytes: 0.02 10*3/uL (ref 0.00–0.07)
Basophils Absolute: 0 10*3/uL (ref 0.0–0.1)
Basophils Relative: 1 %
Eosinophils Absolute: 0.2 10*3/uL (ref 0.0–0.5)
Eosinophils Relative: 4 %
HCT: 29.1 % — ABNORMAL LOW (ref 39.0–52.0)
Hemoglobin: 9.6 g/dL — ABNORMAL LOW (ref 13.0–17.0)
Immature Granulocytes: 0 %
Lymphocytes Relative: 25 %
Lymphs Abs: 1.1 10*3/uL (ref 0.7–4.0)
MCH: 33.6 pg (ref 26.0–34.0)
MCHC: 33 g/dL (ref 30.0–36.0)
MCV: 101.7 fL — ABNORMAL HIGH (ref 80.0–100.0)
Monocytes Absolute: 0.6 10*3/uL (ref 0.1–1.0)
Monocytes Relative: 13 %
Neutro Abs: 2.6 10*3/uL (ref 1.7–7.7)
Neutrophils Relative %: 57 %
Platelets: 305 10*3/uL (ref 150–400)
RBC: 2.86 MIL/uL — ABNORMAL LOW (ref 4.22–5.81)
RDW: 11.9 % (ref 11.5–15.5)
WBC: 4.6 10*3/uL (ref 4.0–10.5)
nRBC: 0 % (ref 0.0–0.2)

## 2023-11-06 NOTE — Plan of Care (Signed)

## 2023-11-06 NOTE — Progress Notes (Signed)
 PROGRESS NOTE    Derrick Thornton  ZOX:[id number] DOB: 21-Mar-1946 DOA: 11/04/2023 PCP: Edda Goo, MD  Subjective: Pt seen and examined. No further bleeding.  Ready to go home   Hospital Course: HPI: Derrick Thornton is a 78 y.o. male with past medical history  ofDM2, HTN, prior GIB, Barrett's esophagus,GIB from diverticular origin admitted in march 2023 with similar episode of rectal bleeding.   Patient states that it really has been going on for 4 days this is his fifth time in the past few years that he has had this.  Today was the worse when he filled the commode with red blood.  No reports of alcohol drugs or tobacco or NSAIDs   Significant Events: Admitted 11/04/2023 for rectal bleeding   Admission Labs: WBC 5.8, HgB 11.3, plt 344 INR 0.9 Na 139, K 3.8, CO2 of 24, BUN 14, Scr 1.04, glu 96 UA negative  Admission Imaging Studies:   Significant Labs:   Significant Imaging Studies:   Antibiotic Therapy: Anti-infectives (From admission, onward)    None       Procedures:   Consultants: GI - [place]    Assessment and Plan: * Rectal bleeding 11-04-2023 likely diverticular bleed. Hopefully it has self resolved. Continue with q8h H&H. Transfuse for HgB less than 7.0 g/dl. Pt verbally consents to PRBC Transfusion if needed. He denies being a Jehovah's witness.  11-05-2023 HgB up to 10.2 g/dl.  Nadir HgB of 9.9.  stop q8h H&H. Repeat CBC in AM. If stable, he can go home tomorrow.  11-06-2023 ready for DC. Bleeding due to diverticular bleed. Pt has iron pills at home. F/u with PCP for repeat CBC in 2 weeks. Stable for DC.  Diverticulosis of colon with hemorrhage 11-04-2023 likely diverticular bleed. Hopefully it has self resolved. Continue with q8h H&H. Transfuse for HgB less than 7.0 g/dl. Pt verbally consents to PRBC Transfusion if needed. He denies being a Jehovah's witness.  11-05-2023 HgB up to 10.2 g/dl.  Nadir HgB of 9.9.  stop q8h H&H. Repeat CBC in AM. If  stable, he can go home tomorrow.  11-06-2023 ready for DC. Bleeding due to diverticular bleed. Pt has iron pills at home. F/u with PCP for repeat CBC in 2 weeks. Stable for DC.  Lower abdominal pain 11-04-2023 likely diverticular bleed. Hopefully it has self resolved. Continue with q8h H&H. Transfuse for HgB less than 7.0 g/dl. Pt verbally consents to PRBC Transfusion if needed. He denies being a Jehovah's witness.  11-05-2023 HgB up to 10.2 g/dl.  Nadir HgB of 9.9.  stop q8h H&H. Repeat CBC in AM. If stable, he can go home tomorrow.  11-06-2023 ready for DC. Bleeding due to diverticular bleed. Pt has iron pills at home. F/u with PCP for repeat CBC in 2 weeks. Stable for DC.  Acute post-hemorrhagic anemia 11-04-2023 likely diverticular bleed. Hopefully it has self resolved. Continue with q8h H&H. Transfuse for HgB less than 7.0 g/dl. Pt verbally consents to PRBC Transfusion if needed. He denies being a Jehovah's witness.  11-05-2023 HgB up to 10.2 g/dl.  Nadir HgB of 9.9.  stop q8h H&H. Repeat CBC in AM. If stable, he can go home tomorrow.  11-06-2023 ready for DC. Bleeding due to diverticular bleed. Pt has iron pills at home. F/u with PCP for repeat CBC in 2 weeks. Stable for DC.  BPH (benign prostatic hyperplasia) 11-04-2023 continue flomax .  11-05-2023 stable.  11-06-2023 stable.  Iron deficiency anemia 11-04-2023 may need iron infusions as outpatient.  11-05-2023 stable.  11-06-2023 stable. Pt has iron pills at home. F/u with PCP for repeat CBC in 2 weeks. Stable for DC.   Type 2 diabetes mellitus (HCC) 11-04-2023 stable.  11-05-2023 stable.  11-06-2023 stable.  Essential hypertension - pt on chronic Cialis . Do NOT give any form of nitroglycerin!! 11-04-2023 pt on chronic Cialis . Do NOT give any form of nitroglycerin!!  11-05-2023 stable.    11-06-2023 stable.  DVT prophylaxis: SCDs Start: 11/04/23 4098    Code Status: Full Code Family Communication: no family at  bedside. Pt is decisional. Disposition Plan: return home Reason for continuing need for hospitalization: stable for DC.  Objective: Vitals:   11/05/23 2021 11/05/23 2021 11/06/23 0423 11/06/23 0745  BP: 121/79 121/79 122/87 125/78  Pulse: 86 85 73 88  Resp: 18 18 18    Temp: 98.1 F (36.7 C) 98.1 F (36.7 C) (!) 97.5 F (36.4 C) 98.2 F (36.8 C)  TempSrc: Oral   Oral  SpO2: 96% 96% 96% 93%    Intake/Output Summary (Last 24 hours) at 11/06/2023 0803 Last data filed at 11/06/2023 0741 Gross per 24 hour  Intake 120 ml  Output --  Net 120 ml   There were no vitals filed for this visit.  Examination:  Physical Exam Vitals and nursing note reviewed.  Constitutional:      General: He is not in acute distress.    Appearance: He is not toxic-appearing.  HENT:     Head: Normocephalic and atraumatic.     Nose: Nose normal.  Pulmonary:     Effort: Pulmonary effort is normal. No respiratory distress.  Abdominal:     General: There is no distension.  Skin:    General: Skin is warm and dry.     Capillary Refill: Capillary refill takes less than 2 seconds.  Neurological:     Mental Status: He is alert and oriented to person, place, and time.     Data Reviewed: I have personally reviewed following labs and imaging studies  CBC: Recent Labs  Lab 11/04/23 0054 11/04/23 0440 11/04/23 1136 11/04/23 1924 11/05/23 0456 11/06/23 0537  WBC 5.8 7.7  --   --   --  4.6  NEUTROABS 3.4 5.8  --   --   --  2.6  HGB 11.3* 10.2* 9.9* 10.4* 10.2* 9.6*  HCT 34.9* 31.3* 29.9* 31.4* 30.9* 29.1*  MCV 102.3* 101.3*  --   --   --  101.7*  PLT 344 306  --   --   --  305   Basic Metabolic Panel: Recent Labs  Lab 11/04/23 0054 11/04/23 0440  NA 139 138  K 3.8 3.8  CL 103 104  CO2 24 27  GLUCOSE 96 101*  BUN 14 12  CREATININE 1.04 1.02  CALCIUM  9.6 9.0  MG  --  1.7  PHOS  --  2.7   GFR: CrCl cannot be calculated (Unknown ideal weight.). Liver Function Tests: Recent Labs  Lab  11/04/23 0054 11/04/23 0440  AST 22 17  ALT 12 14  ALKPHOS 60 55  BILITOT 0.6 0.6  PROT 6.7 6.2*  ALBUMIN 3.7 3.3*   Coagulation Profile: Recent Labs  Lab 11/04/23 0054  INR 0.9   Radiology Studies: CT ANGIO ABDOMEN PELVIS  W & WO CONTRAST Result Date: 11/04/2023 CLINICAL DATA:  Lower GI bleed LLQ pain and oribable diverticular bleeding. EXAM: CTA ABDOMEN AND PELVIS WITHOUT AND WITH CONTRAST TECHNIQUE: Multidetector CT imaging of the abdomen and pelvis was performed using  the standard protocol during bolus administration of intravenous contrast. Multiplanar reconstructed images and MIPs were obtained and reviewed to evaluate the vascular anatomy. RADIATION DOSE REDUCTION: This exam was performed according to the departmental dose-optimization program which includes automated exposure control, adjustment of the mA and/or kV according to patient size and/or use of iterative reconstruction technique. CONTRAST:  75mL OMNIPAQUE  IOHEXOL  350 MG/ML SOLN COMPARISON:  CT Angiography abdomen and pelvis from 08/11/2021. FINDINGS: VASCULAR Aorta: Normal caliber aorta without aneurysm, dissection, vasculitis or significant stenosis. Celiac: Patent without evidence of aneurysm, dissection, vasculitis or significant stenosis. SMA: Patent without evidence of aneurysm, dissection, vasculitis or significant stenosis. Renals: Both renal arteries are patent without evidence of aneurysm, dissection, vasculitis, fibromuscular dysplasia or significant stenosis. IMA: Patent without evidence of aneurysm, dissection, vasculitis or significant stenosis. Inflow: Patent without evidence of aneurysm, dissection, vasculitis or significant stenosis. Proximal Outflow: Bilateral common femoral and visualized portions of the superficial and profunda femoral arteries are patent without evidence of aneurysm, dissection, vasculitis or significant stenosis. Veins: No obvious venous abnormality within the limitations of this arterial phase  study. Review of the MIP images confirms the above findings. NON-VASCULAR Lower chest: The lung bases are clear. No pleural effusion. The heart is normal in size. No pericardial effusion. Hepatobiliary: The liver is normal in size. Non-cirrhotic configuration. No suspicious mass. No intrahepatic or extrahepatic bile duct dilation. No calcified gallstones. Normal gallbladder wall thickness. No pericholecystic inflammatory changes. Pancreas: Unremarkable. No pancreatic ductal dilatation or surrounding inflammatory changes. Spleen: Within normal limits. No focal lesion. Adrenals/Urinary Tract: Adrenal glands are unremarkable. No suspicious renal mass. There are several simple renal cysts in the left kidney with largest in the interpolar region measuring up to 2.4 x 2.5 cm. There are 2 adjacent nonobstructing calculi in the left kidney lower pole calyx with largest measuring up to 4 x 6 mm. No other nephroureterolithiasis on either side. Unremarkable urinary bladder. Stomach/Bowel: No disproportionate dilation of the small or large bowel loops. No evidence of abnormal bowel wall thickening or inflammatory changes. The appendix is unremarkable. There are innumerable large diverticula throughout the colon, without imaging signs of diverticulitis. No evidence of active extravasation of contrast to suggest active GI bleeding. Vascular/Lymphatic: No ascites or pneumoperitoneum. No abdominal or pelvic lymphadenopathy, by size criteria. No aneurysmal dilation of the major abdominal arteries. There are mild peripheral atherosclerotic vascular calcifications of the aorta and its major branches. Reproductive: Enlarged prostate. Symmetric seminal vesicles. Other: There is a tiny fat containing umbilical hernia. There are postsurgical changes in the left inguinal region. The soft tissues and abdominal wall are otherwise unremarkable. Musculoskeletal: No suspicious osseous lesions. There are mild - moderate multilevel degenerative  changes in the visualized spine. IMPRESSION: 1. No active extravasation of contrast to suggest active GI bleeding. 2. No mesenteric artery stenosis, aneurysm, dissection or occlusion. 3. Multiple colonic diverticula without diverticulitis. 4. Multiple other nonacute observations, as described above. Aortic Atherosclerosis (ICD10-I70.0). Electronically Signed   By: Beula Brunswick M.D.   On: 11/04/2023 15:13    Scheduled Meds:  pantoprazole  (PROTONIX ) IV  40 mg Intravenous Q12H   rosuvastatin   10 mg Oral QHS   sodium chloride  flush  3-10 mL Intravenous Q12H   tadalafil   5 mg Oral Once per day on Monday Friday   tamsulosin   0.4 mg Oral Daily   Continuous Infusions:   LOS: 0 days   Time spent: 55 minutes  Unk Garb, DO  [facility]  11/06/2023, 8:03 AM

## 2023-11-06 NOTE — Discharge Summary (Signed)
 [facility] Physician Discharge Summary   Patient name: Derrick Thornton  Admit date:     11/04/2023  Discharge date: 11/06/2023  Attending Physician: Gerre Kraft  Discharge Physician: Unk Garb   PCP: Edda Goo, MD  Admitted From: Home  Disposition:  Home  Recommendations for Outpatient Follow-up:  Follow up with PCP in 1-2 weeks Repeat CBC in 2 weeks at PCP office  Home Health:No Equipment/Devices: None  Discharge Condition:Stable CODE STATUS:FULL Diet recommendation: Heart Healthy Fluid Restriction: None  Hospital Summary: HPI: Derrick Thornton is a 78 y.o. male with past medical history  ofDM2, HTN, prior GIB, Barrett's esophagus,GIB from diverticular origin admitted in march 2023 with similar episode of rectal bleeding.   Patient states that it really has been going on for 4 days this is his fifth time in the past few years that he has had this.  Today was the worse when he filled the commode with red blood.  No reports of alcohol drugs or tobacco or NSAIDs   Significant Events: Admitted 11/04/2023 for rectal bleeding   Admission Labs: WBC 5.8, HgB 11.3, plt 344 INR 0.9 Na 139, K 3.8, CO2 of 24, BUN 14, Scr 1.04, glu 96 UA negative  Admission Imaging Studies:   Significant Labs:   Significant Imaging Studies:   Antibiotic Therapy: Anti-infectives (From admission, onward)    None       Procedures:   Consultants: [facility] Course by Problem: * Rectal bleeding 11-04-2023 likely diverticular bleed. Hopefully it has self resolved. Continue with q8h H&H. Transfuse for HgB less than 7.0 g/dl. Pt verbally consents to PRBC Transfusion if needed. He denies being a Jehovah's witness.  11-05-2023 HgB up to 10.2 g/dl.  Nadir HgB of 9.9.  stop q8h H&H. Repeat CBC in AM. If stable, he can go home tomorrow.  11-06-2023 ready for DC. Bleeding due to diverticular bleed. Pt has iron pills at home. F/u with PCP for repeat CBC in 2  weeks. Stable for DC.  Diverticulosis of colon with hemorrhage 11-04-2023 likely diverticular bleed. Hopefully it has self resolved. Continue with q8h H&H. Transfuse for HgB less than 7.0 g/dl. Pt verbally consents to PRBC Transfusion if needed. He denies being a Jehovah's witness.  11-05-2023 HgB up to 10.2 g/dl.  Nadir HgB of 9.9.  stop q8h H&H. Repeat CBC in AM. If stable, he can go home tomorrow.  11-06-2023 ready for DC. Bleeding due to diverticular bleed. Pt has iron pills at home. F/u with PCP for repeat CBC in 2 weeks. Stable for DC.  Lower abdominal pain 11-04-2023 likely diverticular bleed. Hopefully it has self resolved. Continue with q8h H&H. Transfuse for HgB less than 7.0 g/dl. Pt verbally consents to PRBC Transfusion if needed. He denies being a Jehovah's witness.  11-05-2023 HgB up to 10.2 g/dl.  Nadir HgB of 9.9.  stop q8h H&H. Repeat CBC in AM. If stable, he can go home tomorrow.  11-06-2023 ready for DC. Bleeding due to diverticular bleed. Pt has iron pills at home. F/u with PCP for repeat CBC in 2 weeks. Stable for DC.  Acute post-hemorrhagic anemia 11-04-2023 likely diverticular bleed. Hopefully it has self resolved. Continue with q8h H&H. Transfuse for HgB less than 7.0 g/dl. Pt verbally consents to PRBC Transfusion if needed. He denies being a Jehovah's witness.  11-05-2023 HgB up to 10.2 g/dl.  Nadir HgB of 9.9.  stop q8h H&H. Repeat CBC in AM. If stable, he can go  home tomorrow.  11-06-2023 ready for DC. Bleeding due to diverticular bleed. Pt has iron pills at home. F/u with PCP for repeat CBC in 2 weeks. Stable for DC.  BPH (benign prostatic hyperplasia) 11-04-2023 continue flomax .  11-05-2023 stable.  11-06-2023 stable.  Iron deficiency anemia 11-04-2023 may need iron infusions as outpatient.  11-05-2023 stable.  11-06-2023 stable. Pt has iron pills at home. F/u with PCP for repeat CBC in 2 weeks. Stable for DC.   Type 2 diabetes mellitus  (HCC) 11-04-2023 stable.  11-05-2023 stable.  11-06-2023 stable.  Essential hypertension - pt on chronic Cialis . Do NOT give any form of nitroglycerin!! 11-04-2023 pt on chronic Cialis . Do NOT give any form of nitroglycerin!!  11-05-2023 stable.    11-06-2023 stable.    Discharge Diagnoses:  Principal Problem:   Rectal bleeding Active Problems:   Diverticulosis of colon with hemorrhage   Acute post-hemorrhagic anemia   Lower abdominal pain   Essential hypertension - pt on chronic Cialis . Do NOT give any form of nitroglycerin!!   Type 2 diabetes mellitus (HCC)   Iron deficiency anemia   BPH (benign prostatic hyperplasia)   Discharge Instructions  Discharge Instructions     Call MD for:  difficulty breathing, headache or visual disturbances   Complete by: As directed    Call MD for:  extreme fatigue   Complete by: As directed    Call MD for:  hives   Complete by: As directed    Call MD for:  persistant dizziness or light-headedness   Complete by: As directed    Call MD for:  persistant nausea and vomiting   Complete by: As directed    Call MD for:  redness, tenderness, or signs of infection (pain, swelling, redness, odor or green/yellow discharge around incision site)   Complete by: As directed    Call MD for:  severe uncontrolled pain   Complete by: As directed    Call MD for:  temperature >100.4   Complete by: As directed    Diet - low sodium heart healthy   Complete by: As directed    Discharge instructions   Complete by: As directed    1. Follow up with your primary care provider in 1-2 weeks following discharge from hospital.   Increase activity slowly   Complete by: As directed       Allergies as of 11/06/2023       Reactions   Tuberculin, Ppd    False positive        Medication List     TAKE these medications    folic acid 400 MCG tablet Commonly known as: FOLVITE Take 400 mcg by mouth 3 (three) times a week.   glimepiride  4 MG  tablet Commonly known as: AMARYL  Take 1 mg by mouth daily as needed. If CBG  is > 150   losartan 50 MG tablet Commonly known as: COZAAR Take 50 mg by mouth daily.   omeprazole  40 MG capsule Commonly known as: PRILOSEC Take 1 capsule (40 mg total) by mouth daily. What changed:  when to take this reasons to take this   rosuvastatin  10 MG tablet Commonly known as: CRESTOR  Take 10 mg by mouth at bedtime.   tadalafil  5 MG tablet Commonly known as: CIALIS  Take 5 mg by mouth 2 (two) times a week. Monday & Friday   tamsulosin  0.4 MG Caps capsule Commonly known as: FLOMAX  Take 0.4 mg by mouth daily.   vitamin C with rose hips 500  MG tablet Take 500 mg by mouth 3 (three) times a week. No set days   Zinc 50 MG Tabs Take 50 mg by mouth 2 (two) times a week. No set days        Allergies  Allergen Reactions   Tuberculin, Ppd     False positive    Discharge Exam: Vitals:   11/06/23 0423 11/06/23 0745  BP: 122/87 125/78  Pulse: 73 88  Resp: 18   Temp: (!) 97.5 F (36.4 C) 98.2 F (36.8 C)  SpO2: 96% 93%    Physical Exam Vitals and nursing note reviewed.  Constitutional:      General: He is not in acute distress.    Appearance: He is not toxic-appearing.  HENT:     Head: Normocephalic and atraumatic.     Nose: Nose normal.  Pulmonary:     Effort: Pulmonary effort is normal. No respiratory distress.  Abdominal:     General: There is no distension.  Skin:    General: Skin is warm and dry.     Capillary Refill: Capillary refill takes less than 2 seconds.  Neurological:     Mental Status: He is alert and oriented to person, place, and time.     The results of significant diagnostics from this hospitalization (including imaging, microbiology, ancillary and laboratory) are listed below for reference.    Labs:  Basic Metabolic Panel: Recent Labs  Lab 11/04/23 0054 11/04/23 0440  NA 139 138  K 3.8 3.8  CL 103 104  CO2 24 27  GLUCOSE 96 101*  BUN 14 12   CREATININE 1.04 1.02  CALCIUM  9.6 9.0  MG  --  1.7  PHOS  --  2.7   Liver Function Tests: Recent Labs  Lab 11/04/23 0054 11/04/23 0440  AST 22 17  ALT 12 14  ALKPHOS 60 55  BILITOT 0.6 0.6  PROT 6.7 6.2*  ALBUMIN 3.7 3.3*   CBC: Recent Labs  Lab 11/04/23 0054 11/04/23 0440 11/04/23 1136 11/04/23 1924 11/05/23 0456 11/06/23 0537  WBC 5.8 7.7  --   --   --  4.6  NEUTROABS 3.4 5.8  --   --   --  2.6  HGB 11.3* 10.2* 9.9* 10.4* 10.2* 9.6*  HCT 34.9* 31.3* 29.9* 31.4* 30.9* 29.1*  MCV 102.3* 101.3*  --   --   --  101.7*  PLT 344 306  --   --   --  305   Urinalysis    Component Value Date/Time   COLORURINE STRAW (A) 11/04/2023 0044   APPEARANCEUR CLEAR 11/04/2023 0044   LABSPEC 1.005 11/04/2023 0044   PHURINE 5.0 11/04/2023 0044   GLUCOSEU NEGATIVE 11/04/2023 0044   HGBUR NEGATIVE 11/04/2023 0044   BILIRUBINUR NEGATIVE 11/04/2023 0044   KETONESUR NEGATIVE 11/04/2023 0044   PROTEINUR NEGATIVE 11/04/2023 0044   NITRITE NEGATIVE 11/04/2023 0044   LEUKOCYTESUR NEGATIVE 11/04/2023 0044   Sepsis Labs Recent Labs  Lab 11/04/23 0054 11/04/23 0440 11/06/23 0537  WBC 5.8 7.7 4.6    Procedures/Studies: CT ANGIO ABDOMEN PELVIS  W & WO CONTRAST Result Date: 11/04/2023 CLINICAL DATA:  Lower GI bleed LLQ pain and oribable diverticular bleeding. EXAM: CTA ABDOMEN AND PELVIS WITHOUT AND WITH CONTRAST TECHNIQUE: Multidetector CT imaging of the abdomen and pelvis was performed using the standard protocol during bolus administration of intravenous contrast. Multiplanar reconstructed images and MIPs were obtained and reviewed to evaluate the vascular anatomy. RADIATION DOSE REDUCTION: This exam was performed according to the departmental  dose-optimization program which includes automated exposure control, adjustment of the mA and/or kV according to patient size and/or use of iterative reconstruction technique. CONTRAST:  75mL OMNIPAQUE  IOHEXOL  350 MG/ML SOLN COMPARISON:  CT  Angiography abdomen and pelvis from 08/11/2021. FINDINGS: VASCULAR Aorta: Normal caliber aorta without aneurysm, dissection, vasculitis or significant stenosis. Celiac: Patent without evidence of aneurysm, dissection, vasculitis or significant stenosis. SMA: Patent without evidence of aneurysm, dissection, vasculitis or significant stenosis. Renals: Both renal arteries are patent without evidence of aneurysm, dissection, vasculitis, fibromuscular dysplasia or significant stenosis. IMA: Patent without evidence of aneurysm, dissection, vasculitis or significant stenosis. Inflow: Patent without evidence of aneurysm, dissection, vasculitis or significant stenosis. Proximal Outflow: Bilateral common femoral and visualized portions of the superficial and profunda femoral arteries are patent without evidence of aneurysm, dissection, vasculitis or significant stenosis. Veins: No obvious venous abnormality within the limitations of this arterial phase study. Review of the MIP images confirms the above findings. NON-VASCULAR Lower chest: The lung bases are clear. No pleural effusion. The heart is normal in size. No pericardial effusion. Hepatobiliary: The liver is normal in size. Non-cirrhotic configuration. No suspicious mass. No intrahepatic or extrahepatic bile duct dilation. No calcified gallstones. Normal gallbladder wall thickness. No pericholecystic inflammatory changes. Pancreas: Unremarkable. No pancreatic ductal dilatation or surrounding inflammatory changes. Spleen: Within normal limits. No focal lesion. Adrenals/Urinary Tract: Adrenal glands are unremarkable. No suspicious renal mass. There are several simple renal cysts in the left kidney with largest in the interpolar region measuring up to 2.4 x 2.5 cm. There are 2 adjacent nonobstructing calculi in the left kidney lower pole calyx with largest measuring up to 4 x 6 mm. No other nephroureterolithiasis on either side. Unremarkable urinary bladder. Stomach/Bowel:  No disproportionate dilation of the small or large bowel loops. No evidence of abnormal bowel wall thickening or inflammatory changes. The appendix is unremarkable. There are innumerable large diverticula throughout the colon, without imaging signs of diverticulitis. No evidence of active extravasation of contrast to suggest active GI bleeding. Vascular/Lymphatic: No ascites or pneumoperitoneum. No abdominal or pelvic lymphadenopathy, by size criteria. No aneurysmal dilation of the major abdominal arteries. There are mild peripheral atherosclerotic vascular calcifications of the aorta and its major branches. Reproductive: Enlarged prostate. Symmetric seminal vesicles. Other: There is a tiny fat containing umbilical hernia. There are postsurgical changes in the left inguinal region. The soft tissues and abdominal wall are otherwise unremarkable. Musculoskeletal: No suspicious osseous lesions. There are mild - moderate multilevel degenerative changes in the visualized spine. IMPRESSION: 1. No active extravasation of contrast to suggest active GI bleeding. 2. No mesenteric artery stenosis, aneurysm, dissection or occlusion. 3. Multiple colonic diverticula without diverticulitis. 4. Multiple other nonacute observations, as described above. Aortic Atherosclerosis (ICD10-I70.0). Electronically Signed   By: Beula Brunswick M.D.   On: 11/04/2023 15:13    Time coordinating discharge: 55 mins  SIGNED:  Unk Garb, DO [facility] 11/06/23, 8:04 AM
